# Patient Record
Sex: Female | Born: 1950 | Race: White | Hispanic: No | Marital: Married | Smoking: Never smoker
Health system: Southern US, Community
[De-identification: ages and names within clinical notes are randomized; demographics above are authoritative.]

## PROBLEM LIST (undated history)

## (undated) DIAGNOSIS — M1990 Unspecified osteoarthritis, unspecified site: Secondary | ICD-10-CM

## (undated) DIAGNOSIS — I350 Nonrheumatic aortic (valve) stenosis: Secondary | ICD-10-CM

## (undated) DIAGNOSIS — E7800 Pure hypercholesterolemia, unspecified: Secondary | ICD-10-CM

## (undated) DIAGNOSIS — T7840XA Allergy, unspecified, initial encounter: Secondary | ICD-10-CM

## (undated) HISTORY — DX: Allergy, unspecified, initial encounter: T78.40XA

## (undated) HISTORY — PX: INGUINAL HERNIA REPAIR: SUR1180

## (undated) HISTORY — PX: JOINT REPLACEMENT: SHX530

## (undated) HISTORY — PX: CARDIAC CATHETERIZATION: SHX172

---

## 1989-01-18 HISTORY — PX: GANGLION CYST EXCISION: SHX1691

## 1999-07-31 ENCOUNTER — Encounter: Payer: Self-pay | Admitting: Obstetrics and Gynecology

## 1999-07-31 ENCOUNTER — Ambulatory Visit (HOSPITAL_COMMUNITY): Admission: RE | Admit: 1999-07-31 | Discharge: 1999-07-31 | Payer: Self-pay | Admitting: Obstetrics and Gynecology

## 2000-11-10 ENCOUNTER — Other Ambulatory Visit: Admission: RE | Admit: 2000-11-10 | Discharge: 2000-11-10 | Payer: Self-pay | Admitting: *Deleted

## 2000-11-18 ENCOUNTER — Ambulatory Visit (HOSPITAL_COMMUNITY): Admission: RE | Admit: 2000-11-18 | Discharge: 2000-11-18 | Payer: Self-pay | Admitting: Obstetrics and Gynecology

## 2000-11-18 ENCOUNTER — Encounter: Payer: Self-pay | Admitting: Obstetrics and Gynecology

## 2002-01-06 ENCOUNTER — Other Ambulatory Visit: Admission: RE | Admit: 2002-01-06 | Discharge: 2002-01-06 | Payer: Self-pay | Admitting: Obstetrics and Gynecology

## 2006-07-21 ENCOUNTER — Emergency Department (HOSPITAL_COMMUNITY): Admission: EM | Admit: 2006-07-21 | Discharge: 2006-07-21 | Payer: Self-pay | Admitting: Emergency Medicine

## 2006-08-06 ENCOUNTER — Encounter: Admission: RE | Admit: 2006-08-06 | Discharge: 2006-08-22 | Payer: Self-pay | Admitting: Internal Medicine

## 2006-09-04 ENCOUNTER — Ambulatory Visit (HOSPITAL_COMMUNITY): Admission: RE | Admit: 2006-09-04 | Discharge: 2006-09-04 | Payer: Self-pay | Admitting: Internal Medicine

## 2006-09-04 ENCOUNTER — Ambulatory Visit: Payer: Self-pay | Admitting: Vascular Surgery

## 2006-09-04 ENCOUNTER — Encounter: Payer: Self-pay | Admitting: Vascular Surgery

## 2007-08-18 ENCOUNTER — Ambulatory Visit (HOSPITAL_COMMUNITY): Admission: RE | Admit: 2007-08-18 | Discharge: 2007-08-18 | Payer: Self-pay | Admitting: Emergency Medicine

## 2007-10-18 ENCOUNTER — Emergency Department (HOSPITAL_COMMUNITY): Admission: EM | Admit: 2007-10-18 | Discharge: 2007-10-18 | Payer: Self-pay | Admitting: Family Medicine

## 2008-02-02 ENCOUNTER — Ambulatory Visit: Payer: Self-pay | Admitting: Family Medicine

## 2008-06-05 ENCOUNTER — Emergency Department (HOSPITAL_COMMUNITY): Admission: EM | Admit: 2008-06-05 | Discharge: 2008-06-05 | Payer: Self-pay | Admitting: Family Medicine

## 2008-08-05 ENCOUNTER — Ambulatory Visit (HOSPITAL_COMMUNITY): Admission: RE | Admit: 2008-08-05 | Discharge: 2008-08-05 | Payer: Self-pay | Admitting: Otolaryngology

## 2008-09-29 ENCOUNTER — Emergency Department (HOSPITAL_COMMUNITY): Admission: EM | Admit: 2008-09-29 | Discharge: 2008-09-29 | Payer: Self-pay | Admitting: Family Medicine

## 2008-12-09 ENCOUNTER — Encounter: Payer: Self-pay | Admitting: Sports Medicine

## 2008-12-09 ENCOUNTER — Ambulatory Visit: Payer: Self-pay | Admitting: Family Medicine

## 2008-12-09 DIAGNOSIS — M76899 Other specified enthesopathies of unspecified lower limb, excluding foot: Secondary | ICD-10-CM

## 2009-01-27 ENCOUNTER — Ambulatory Visit: Payer: Self-pay | Admitting: Family Medicine

## 2009-01-27 DIAGNOSIS — M25569 Pain in unspecified knee: Secondary | ICD-10-CM

## 2009-01-27 DIAGNOSIS — Q6650 Congenital pes planus, unspecified foot: Secondary | ICD-10-CM

## 2009-02-10 ENCOUNTER — Ambulatory Visit: Payer: Self-pay | Admitting: Family Medicine

## 2009-08-16 IMAGING — CT CT MAXILLOFACIAL W/O CM
1 of 2 series · 16 of 30 positions shown, 20 images · non-contrast
Comparison: None

CLINICAL DATA: Recurrent sinusitis

CT MAXILLOFACIAL WITHOUT CONTRAST
TECHNIQUE: Multidetector CT imaging of the maxillofacial
structures was performed. Multiplanar CT image reconstructions were
also generated.

[Series 4: sinus prone · axial · 0.33mm/px · z∈[+117,+217]mm · 16 of 44 slices shown, 20 images]
[im 3/44  brain]
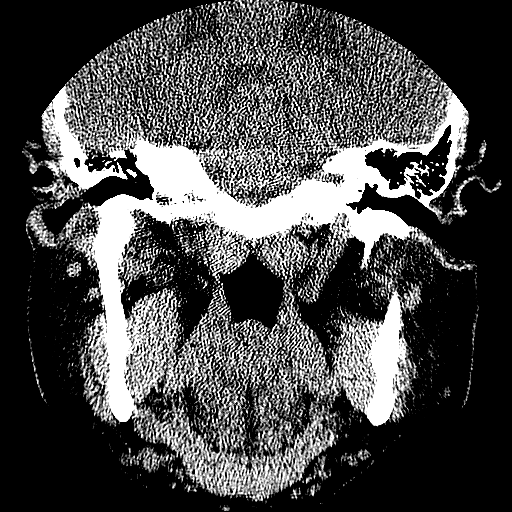
[im 3/44  bone]
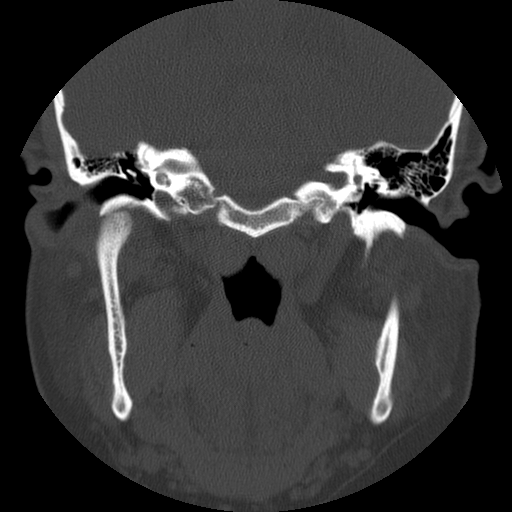
[im 5/44  bone]
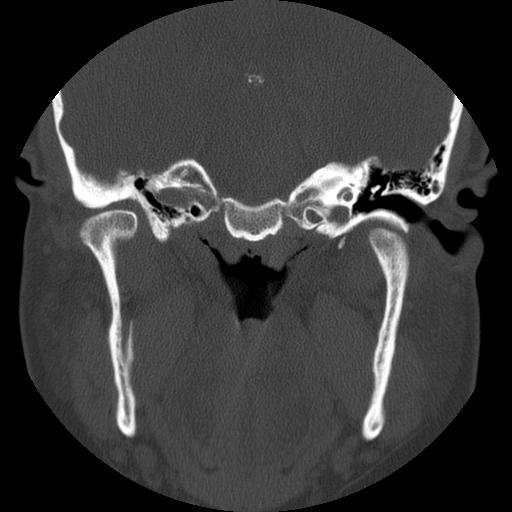
[im 8/44  bone]
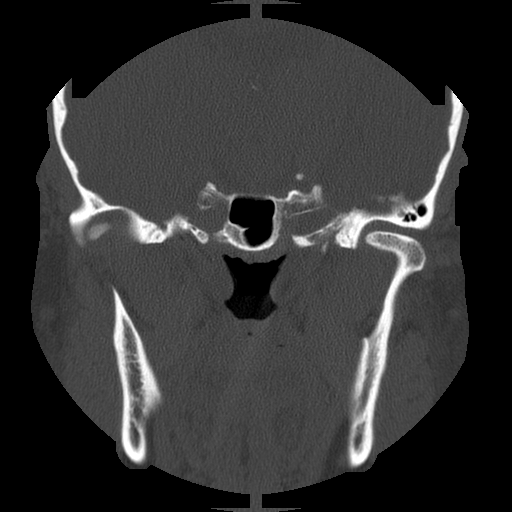
[im 10/44  bone]
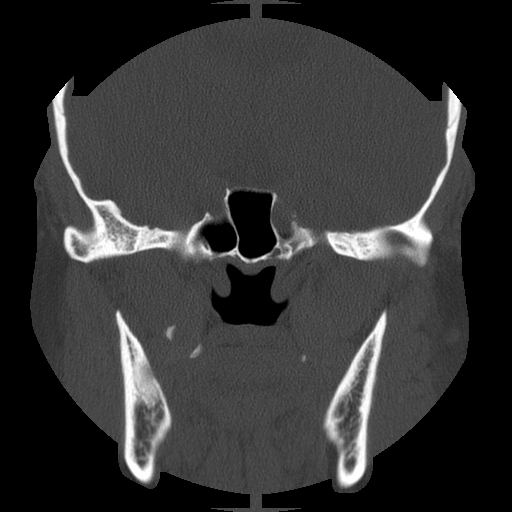
[im 12/44  brain]
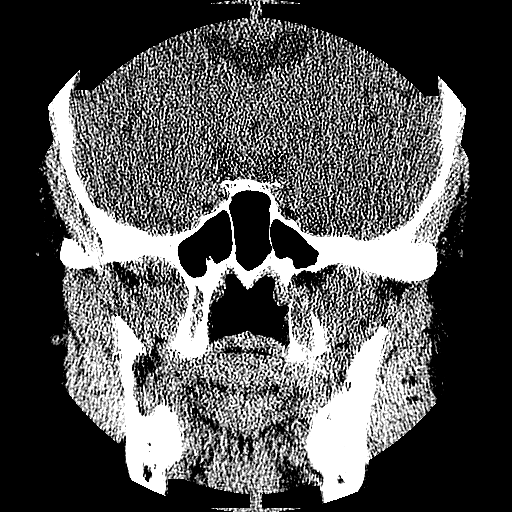
[im 12/44  bone]
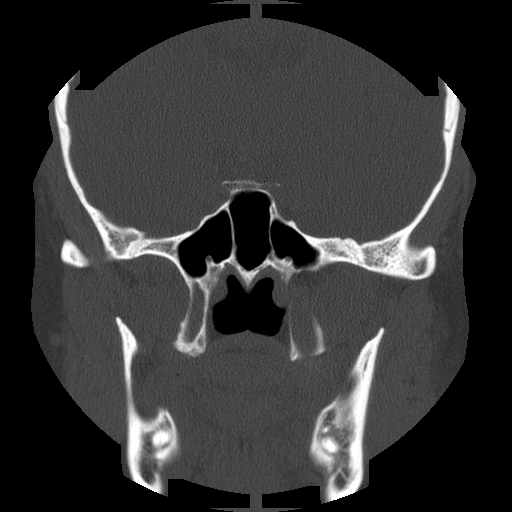
[im 15/44  bone]
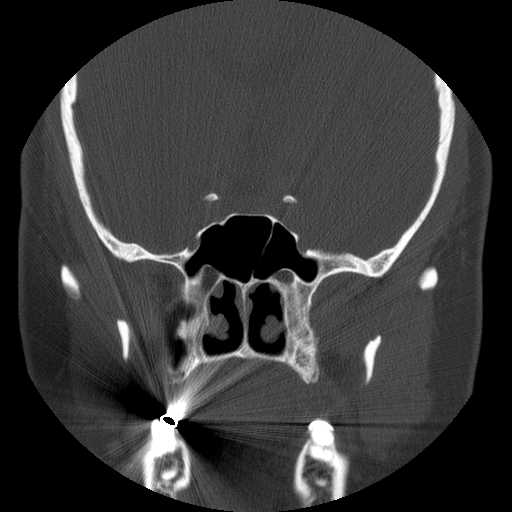
[im 17/44  bone]
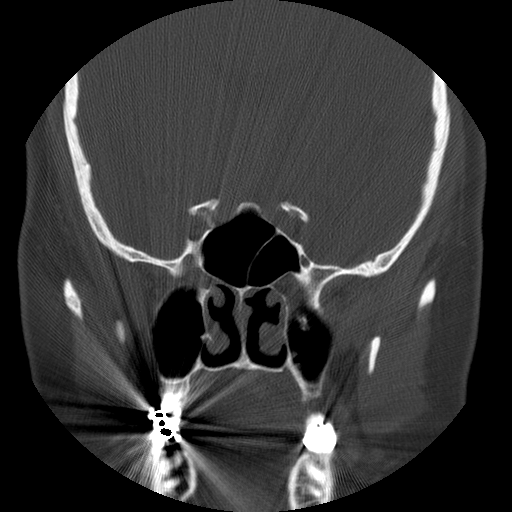
[im 20/44  bone]
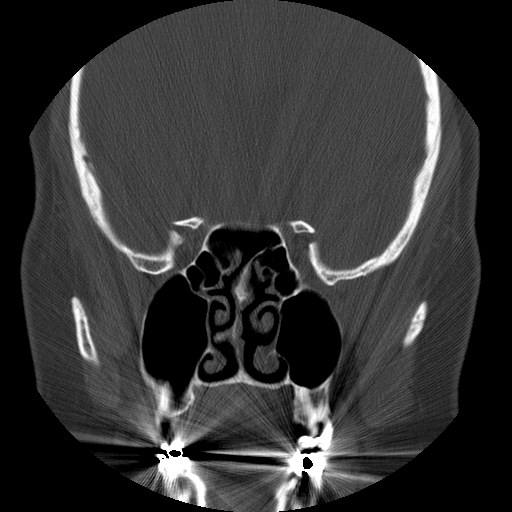
[im 24/44  brain]
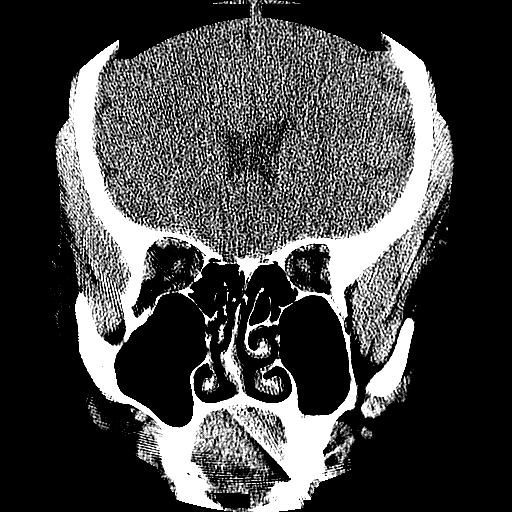
[im 24/44  bone]
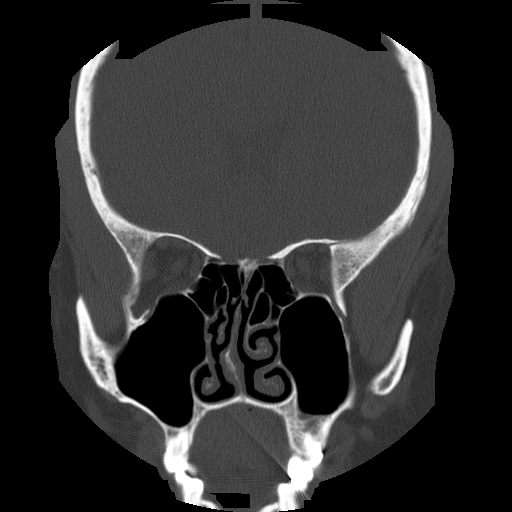
[im 27/44  bone]
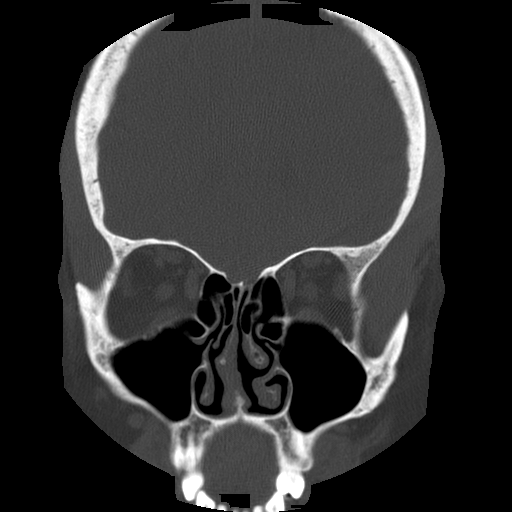
[im 29/44  bone]
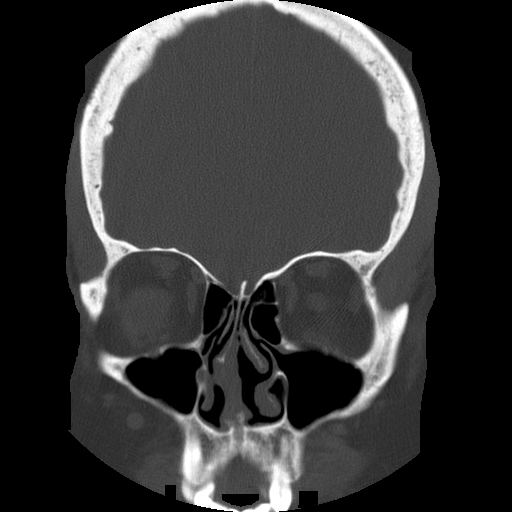
[im 32/44  bone]
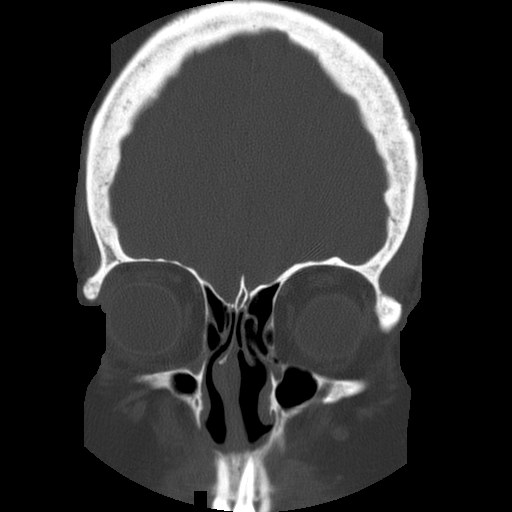
[im 34/44  brain]
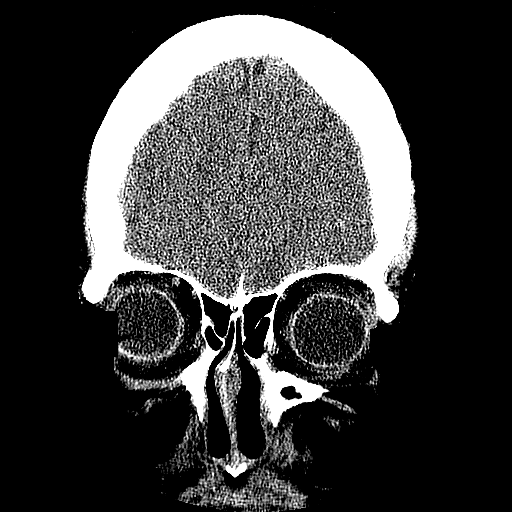
[im 34/44  bone]
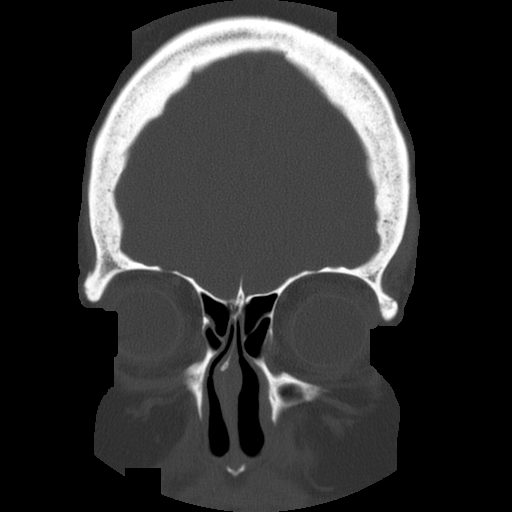
[im 36/44  bone]
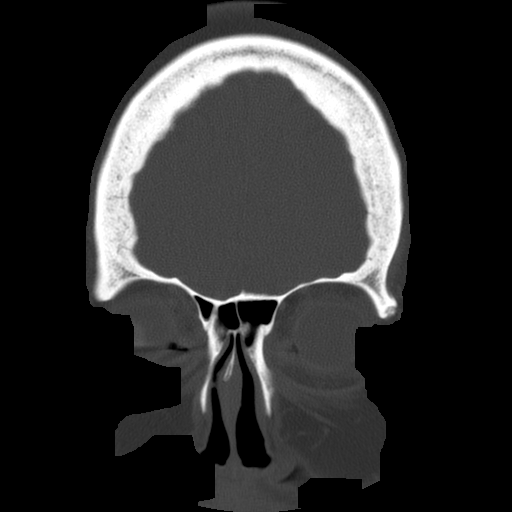
[im 39/44  bone]
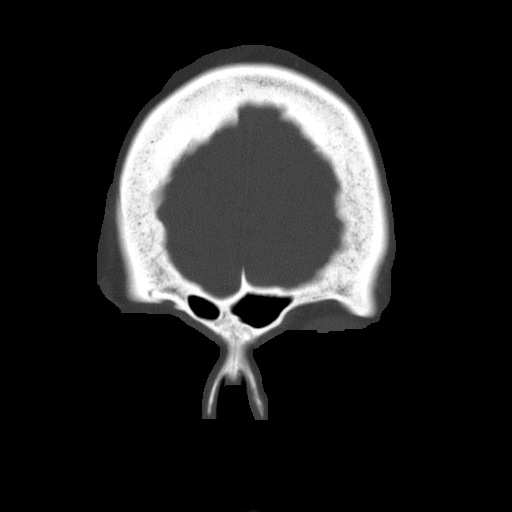
[im 41/44  bone]
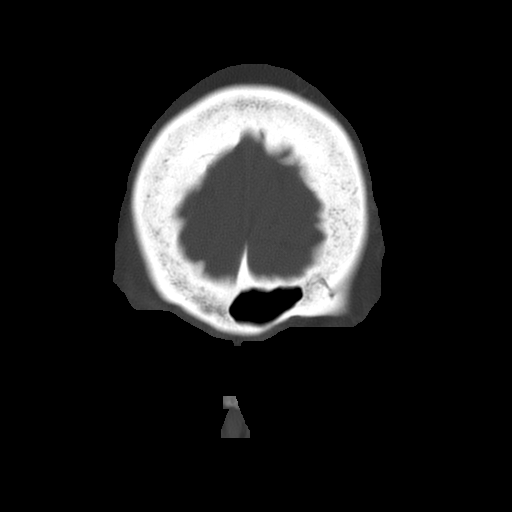

[16 of 30 positions shown; findings below may reference images not displayed]

FINDINGS: Left frontal sinus is hypoplastic.  The remainder of the
paranasal sinuses appear normally developed and well aerated.
There is mild leftward deviation of the nasal septum.  Ostiomeatal
units appear patent bilaterally.  Temporomandibular joints seated
bilaterally.  Multiple dental restorations noted.  Regional soft
tissues unremarkable.
IMPRESSION: 1.  Mild leftward nasal septal deviation.
2.  Otherwise negative study.

## 2010-05-11 ENCOUNTER — Emergency Department (HOSPITAL_COMMUNITY)
Admission: EM | Admit: 2010-05-11 | Discharge: 2010-05-11 | Payer: Self-pay | Source: Home / Self Care | Admitting: Emergency Medicine

## 2010-10-31 ENCOUNTER — Encounter: Payer: Self-pay | Admitting: Family Medicine

## 2010-10-31 ENCOUNTER — Ambulatory Visit (INDEPENDENT_AMBULATORY_CARE_PROVIDER_SITE_OTHER): Payer: 59 | Admitting: Family Medicine

## 2010-10-31 VITALS — BP 149/79 | HR 79 | Ht 64.0 in | Wt 280.0 lb

## 2010-10-31 DIAGNOSIS — M65331 Trigger finger, right middle finger: Secondary | ICD-10-CM

## 2010-10-31 DIAGNOSIS — M6530 Trigger finger, unspecified finger: Secondary | ICD-10-CM

## 2010-10-31 NOTE — Progress Notes (Signed)
  Subjective:    Patient ID: Charlene Tapia, female    DOB: Dec 25, 1950, 60 y.o.   MRN: [id number]  HPI 60 yo F here for 3 months of Rt hand pain and some triggering at base of her 3rd finger. Denies any specific injury. Denies history of diabetes. She has a constant toothache over the volar aspect of her third MCP. She's been doing over-the-counter anti-inflammatories. It is not trigger on her all the time, but will trigger on her occasionally. Is getting in the way of her job as she constantly is on the computer and using her hands. She works in the Production designer, theatre/television/film at [employer]. Has been some stretches at home.   Review of Systems Denies fever, sweats, chills, weight loss    Objective:   Physical Exam General appearance: Overweight female in no distress Psych: Pleasant affect Neuro: Alert and oriented Neurovascularly: Intact distally Right hand positive tenderness over the volar aspect of the right third MCP joint at the A1 pulley. Palpation with dynamic flexion extension does reveal a nodular structure. She is able to actively trigger one time for me during exam.       Assessment & Plan:  Right third finger - Discussed options, she would like to proceed with injection today, see procedure note below -Continue over-the-counter anti-inflammatories and some home stretches and self massage -Followup in one month. I described the nature of this treatment process, and said we could inject up to a couple more times. She may also do well with hand therapy. I discussed that at some point if it does not get better she can see an orthopedic surgeon for surgical release.  Consent obtained and verified. Sterile betadine prep. Furthur cleansed with alcohol. Topical analgesic spray: Ethyl chloride. Joint: Rt 3rd trigger finger at A1 pully Approached in typical fashion with: direct approach into nodule Completed without difficulty Meds: 0.4 cc kenalog 10 mg and 0.4 cc 1% lidocaine Needle:  25G 1.5 inch Aftercare instructions and Red flags advised.

## 2011-04-10 ENCOUNTER — Encounter (HOSPITAL_COMMUNITY): Payer: Self-pay | Admitting: *Deleted

## 2011-04-10 ENCOUNTER — Emergency Department (HOSPITAL_COMMUNITY)
Admission: EM | Admit: 2011-04-10 | Discharge: 2011-04-10 | Disposition: A | Discharge status: Home / Self Care | Payer: 59 | Source: Home / Self Care | Attending: Emergency Medicine | Admitting: Emergency Medicine

## 2011-04-10 DIAGNOSIS — R05 Cough: Secondary | ICD-10-CM

## 2011-04-10 DIAGNOSIS — J329 Chronic sinusitis, unspecified: Secondary | ICD-10-CM

## 2011-04-10 MED ORDER — FEXOFENADINE HCL 60 MG PO TABS: 60.0000 mg | ORAL_TABLET | Freq: Two times a day (BID) | ORAL | Status: DC

## 2011-04-10 MED ORDER — AMOXICILLIN-POT CLAVULANATE 500-125 MG PO TABS: 1.0000 | ORAL_TABLET | Freq: Two times a day (BID) | ORAL | Status: AC

## 2011-04-10 NOTE — ED Notes (Signed)
Pt  Has  Symptoms  Of   Sinus    Congestion  With  Bright  Yellow  Congestion     Nasal  stuffyness      Symptoms     X  3  Weeks   Not  releived   By    otc  meds  Also has    Headache

## 2011-04-10 NOTE — ED Provider Notes (Signed)
History     CSN: [id number] Arrival date & time: 04/10/2011  3:04 PM   First MD Initiated Contact with Patient 04/10/11 1457      Chief Complaint  Patient presents with  . Nasal Congestion    (Consider location/radiation/quality/duration/timing/severity/associated sxs/prior treatment) HPI Comments: Sinus congestion and pressure on my sinuses (points to both maxillary and frontal regions) with yellow discharge and dripping my coughing is been worse  Patient is a 60 y.o. female presenting with sinusitis. The history is provided by the patient.  Sinusitis  This is a new problem. There has been no fever. The pain is at a severity of 4/10. The pain is moderate. The pain has been constant since onset. Associated symptoms include congestion, sinus pressure and sore throat. Pertinent negatives include no cough and no shortness of breath.    No past medical history on file.  No past surgical history on file.  No family history on file.  History  Substance Use Topics  . Smoking status: Never Smoker   . Smokeless tobacco: Never Used  . Alcohol Use: Not on file    OB History    Grav Para Term Preterm Abortions TAB SAB Ect Mult Living                  Review of Systems  Constitutional: Negative for fever and fatigue.  HENT: Positive for congestion, sore throat, postnasal drip and sinus pressure. Negative for neck stiffness and ear discharge.   Eyes: Negative for pain.  Respiratory: Negative for cough, shortness of breath and wheezing.     Allergies  Phisohex  Home Medications   Current Outpatient Rx  Name Route Sig Dispense Refill  . MOMETASONE FUROATE 50 MCG/ACT NA SUSP Nasal Place 2 sprays into the nose daily.        There were no vitals taken for this visit.  Physical Exam  Nursing note and vitals reviewed. Constitutional: She appears well-developed and well-nourished.  HENT:  Head: Normocephalic.  Right Ear: Tympanic membrane normal.  Left Ear: Tympanic  membrane normal.  Mouth/Throat: Uvula is midline and mucous membranes are normal. Oropharyngeal exudate present.  Eyes: Pupils are equal, round, and reactive to light. Right eye exhibits no discharge. Left eye exhibits no discharge.  Neck: Normal range of motion.  Pulmonary/Chest: Effort normal and breath sounds normal. No respiratory distress. She has no wheezes. She exhibits no tenderness.  Lymphadenopathy:    She has no cervical adenopathy.    ED Course  Procedures (including critical care time)  Labs Reviewed - No data to display No results found.   No diagnosis found.    MDM  Sinus sx's x 3 weeks worsening and with a reactive cough        Jimmie Molly, MD 04/10/11 1605

## 2011-04-30 ENCOUNTER — Ambulatory Visit (INDEPENDENT_AMBULATORY_CARE_PROVIDER_SITE_OTHER): Payer: 59

## 2011-04-30 DIAGNOSIS — J40 Bronchitis, not specified as acute or chronic: Secondary | ICD-10-CM

## 2011-04-30 DIAGNOSIS — J019 Acute sinusitis, unspecified: Secondary | ICD-10-CM

## 2011-04-30 DIAGNOSIS — R51 Headache: Secondary | ICD-10-CM

## 2011-05-07 ENCOUNTER — Other Ambulatory Visit: Payer: Self-pay | Admitting: Oncology

## 2011-05-07 DIAGNOSIS — C50919 Malignant neoplasm of unspecified site of unspecified female breast: Secondary | ICD-10-CM

## 2011-05-07 DIAGNOSIS — E559 Vitamin D deficiency, unspecified: Secondary | ICD-10-CM

## 2011-09-02 ENCOUNTER — Other Ambulatory Visit: Payer: Self-pay | Admitting: Internal Medicine

## 2012-11-10 ENCOUNTER — Ambulatory Visit (INDEPENDENT_AMBULATORY_CARE_PROVIDER_SITE_OTHER): Payer: 59 | Admitting: Family Medicine

## 2012-11-10 ENCOUNTER — Encounter: Payer: Self-pay | Admitting: Family Medicine

## 2012-11-10 VITALS — BP 167/95 | Ht 64.0 in | Wt 280.0 lb

## 2012-11-10 DIAGNOSIS — M25569 Pain in unspecified knee: Secondary | ICD-10-CM

## 2012-11-10 DIAGNOSIS — M25562 Pain in left knee: Secondary | ICD-10-CM | POA: Insufficient documentation

## 2012-11-10 MED ORDER — TRAMADOL HCL 50 MG PO TABS: 50.0000 mg | ORAL_TABLET | Freq: Every evening | ORAL | Status: DC | PRN

## 2012-11-10 MED ORDER — MELOXICAM 15 MG PO TABS: 15.0000 mg | ORAL_TABLET | Freq: Every day | ORAL | Status: DC

## 2012-11-10 NOTE — Progress Notes (Signed)
Chief complaint left knee pain  History of present illness: Patient is a 62 year old female with a past medical history significant for left knee osteoarthritis coming in with worsening knee pain. Patient back in 2008 did have x-rays which were reviewed today. Patient had osteoarthritis of the lateral compartment with joint space narrowing and spur formation and when reviewed appears to be more of a tricompartmental osteoarthritic changes. Patient states that this has progressively worsened over the course of time. Patient states that the pain is now more on the medial aspect of the left knee. Patient states that there is more swelling of this knee during the course of the day. Patient is nonambulatory with the aid of a cane. Patient describes the pain is more of a severe sharp aching sensation with movement that as a dull aching sensation with sitting. Patient denies any radiation down the leg or any numbness or tingling. She does state that from time to time it feels like it is ready to get out of her. Patient denies any falls appear.  Past medical history, social, surgical and family history all reviewed.   Physical exam Blood pressure 167/95, height 5\' 4"  (1.626 m), weight 280 lb (127.007 kg). General: No apparent distress alert and oriented x3 mood and affect normal patient is obese. Respiratory: Patient's speak in full sentences and does not appear short of breath Skin: Warm dry intact with no signs of infection or rash Neuro: Cranial nerves II through XII are intact, neurovascularly intact in all extremities with 2+ DTRs and 2+ pulses. Left knee exam: On inspection patient does have trace effusion. She is severely tender to the patient over the medial and lateral joint lines. Patient has range of motion from 10 to 95. Patient does have an severe crepitus on range of motion. The ligaments appear to be intact. Positive McMurray's. Neurovascular intact distally

## 2012-11-10 NOTE — Assessment & Plan Note (Signed)
Patient had history of osteoarthritic changes and is likely had progression. Patient declined repeat imaging at this time. I do think would patient's mechanical symptoms and positive McMurray's today she may also have a degenerative meniscal tear given her some discomfort.  After verbal and written consent patient was prepped with 2 alcohol swabs and then did have injection of with a 25-gauge 1-1/2 inch needle 2 cc of 1% lidocaine, 2 cc of 0.5% Marcaine, and 1 cc of that the Medrol 80 mg/dL injected into the right knee. Patient tolerated the procedure well with no blood loss. Patient did have improvement in pain immediately.  Patient given a compression sleeve, meloxicam and tramadol, discussed Tylenol, given home exercise program at this time. Patient declined a physical therapy. Patient though may call at any point and we will order physical therapy. Patient return in 4-6 weeks for further evaluation. At that time if she continues to have pain I would like to start formal physical therapy and get x-rays.

## 2012-11-10 NOTE — Patient Instructions (Addendum)
Very good to see you again. Try meloxicam daily for one week and as needed thereafter. Tylenol 650 mg 3 times a day scheduled Tramadol one pill at night if needed. Exercises that giving you. You can call back and we can start physical therapy at any point. Icing 20 minutes 2-3 times a day to be helpful as well. Tried a compression sleeve. Come back again in 4-6 weeks

## 2013-05-04 ENCOUNTER — Ambulatory Visit (INDEPENDENT_AMBULATORY_CARE_PROVIDER_SITE_OTHER): Payer: 59 | Admitting: Sports Medicine

## 2013-05-04 ENCOUNTER — Encounter: Payer: Self-pay | Admitting: Sports Medicine

## 2013-05-04 VITALS — BP 164/91 | HR 73 | Ht 64.0 in | Wt 280.0 lb

## 2013-05-04 DIAGNOSIS — M1710 Unilateral primary osteoarthritis, unspecified knee: Secondary | ICD-10-CM

## 2013-05-04 DIAGNOSIS — M25562 Pain in left knee: Secondary | ICD-10-CM

## 2013-05-04 DIAGNOSIS — E669 Obesity, unspecified: Secondary | ICD-10-CM

## 2013-05-04 DIAGNOSIS — M1712 Unilateral primary osteoarthritis, left knee: Secondary | ICD-10-CM

## 2013-05-04 DIAGNOSIS — M25569 Pain in unspecified knee: Secondary | ICD-10-CM

## 2013-05-04 MED ORDER — MELOXICAM 15 MG PO TABS: 15.0000 mg | ORAL_TABLET | Freq: Every day | ORAL | Status: DC

## 2013-05-04 MED ORDER — TRIAMCINOLONE ACETONIDE 10 MG/ML IJ SUSP
10.0000 mg | Freq: Once | INTRAMUSCULAR | Status: AC
  Administered 2013-05-04: 10 mg via INTRA_ARTICULAR

## 2013-05-04 NOTE — Progress Notes (Signed)
Family Medicine Office Visit Note   Subjective:   Patient ID: Charlene Tapia, female  DOB: [birth year], 62 y.o.. MRN: [id number]   Pt with PMHX significant for Bilateral Osteoarthritis of Knee that comes today complaining of worsening left knee pain. She has had one steroid knee injection in that L knee in June 2013 with good response and is requesting another steroid shot in order to make through the holidays. Since she is considering Knee replacement surgery.  Note that review of her standing x-rays from 2008 and they showed that even 6 years ago she had significant osteoarthritis  Objective:   Physical Exam: Gen:  NAD. Walking aid by cane. MSK: Full ROM, small crepitus with knee extension. Tenderness to palpation on medial aspect at level of pes anserine bursa. No Knee laxity.  Joint line was nontender Flexion and extension was not terribly painful There are chronic degenerative changes but the knee is not warm or swollen  There is puffiness and direct tenderness over the pes anserine  Assessment & Plan:    Pes Anserine injection Procedure Note  Pre-operative Diagnosis: left Pes anerine Bursitis  Post-operative Diagnosis: same  Indications: Symptom relief  Procedure Details  Verbal consent was obtained for the procedure. The joint was prepped with Isopropyl alcohol. A 25 gauge needle was inserted into the medial aspect of the patellar bursa. 2 ml 1% lidocaine and 2 ml of depomedrol -10mg  was then injected into the area; needle was removed and the area cleansed and dressed.(Correction - med was Kenalog 10 KBF)  Complications:  None; patient tolerated the procedure well.

## 2013-05-04 NOTE — Patient Instructions (Addendum)
Knee Injection RISKS AND COMPLICATIONS Side effects from cortisone shots are rare. They include:   Slight bruising of the skin.  Shrinkage of the normal fatty tissue under the skin where the shot was given.  Increase in pain after the shot.  Infection.  Weakening of tendons or tendon rupture.  Allergic reaction to the medicine.  Diabetics may have a temporary increase in their blood sugar after a shot.  Cortisone can temporarily weaken the immune system. While receiving these shots, you should not get certain vaccines. Also, avoid contact with anyone who has chickenpox or measles. Especially if you have never had these diseases or have not been previously immunized. Your immune system may not be strong enough to fight off the infection while the cortisone is in your system. AFTER THE PROCEDURE   You can go home after the procedure.  You may need to put ice on the joint 15-20 minutes every 3 or 4 hours until the pain goes away.  You may need to put an elastic bandage on the joint. HOME CARE INSTRUCTIONS   Only take over-the-counter or prescription medicines for pain, discomfort, or fever as directed by your caregiver.  You should avoid stressing the joint. Unless advised otherwise, avoid activities that put a lot of pressure on a knee joint, such as:  Jogging.  Bicycling.  Recreational climbing.  Hiking.  Laying down and elevating the leg/knee above the level of your heart can help to minimize swelling. SEEK MEDICAL CARE IF:   You have repeated or worsening swelling.  There is drainage from the puncture area.  You develop red streaking that extends above or below the site where the needle was inserted. SEEK IMMEDIATE MEDICAL CARE IF:   You develop a fever.  You have pain that gets worse even though you are taking pain medicine.  The area is red and warm, and you have trouble moving the joint. MAKE SURE YOU:   Understand these instructions.  Will watch your  condition.  Will get help right away if you are not doing well or get worse. Document Released: 07/28/2006 Document Revised: 07/29/2011 Document Reviewed: 04/24/2007 [facility] Patient Information 2014 [place], [place].  DR Alphonsa Gin ORTHO [address] [place] [facility] Tuesday 05/18/13 @ 815AM [phone]

## 2013-05-04 NOTE — Assessment & Plan Note (Addendum)
Pes anseriner bursa injection.  Pt tolerated procedure well.  Referral for Ortho evaluation given.   Precautions given about injection  If she is not getting enough relief she may return  Prescription for Mobic for pain

## 2013-05-21 ENCOUNTER — Ambulatory Visit: Payer: 59

## 2013-05-21 ENCOUNTER — Ambulatory Visit (INDEPENDENT_AMBULATORY_CARE_PROVIDER_SITE_OTHER): Payer: 59 | Admitting: Family Medicine

## 2013-05-21 VITALS — BP 162/70 | HR 98 | Temp 102.8°F | Resp 16 | Ht 64.0 in | Wt 278.0 lb

## 2013-05-21 DIAGNOSIS — R062 Wheezing: Secondary | ICD-10-CM

## 2013-05-21 DIAGNOSIS — R059 Cough, unspecified: Secondary | ICD-10-CM

## 2013-05-21 DIAGNOSIS — R509 Fever, unspecified: Secondary | ICD-10-CM

## 2013-05-21 DIAGNOSIS — R05 Cough: Secondary | ICD-10-CM

## 2013-05-21 LAB — POCT CBC
Granulocyte percent: 79.9 %G (ref 37–80)
HCT, POC: 44.2 % (ref 37.7–47.9)
Hemoglobin: 13.7 g/dL (ref 12.2–16.2)
Lymph, poc: 1.1 (ref 0.6–3.4)
MCH, POC: 28.9 pg (ref 27–31.2)
MCHC: 31 g/dL — AB (ref 31.8–35.4)
MCV: 93.2 fL (ref 80–97)
MID (cbc): 0.7 (ref 0–0.9)
MPV: 9.3 fL (ref 0–99.8)
POC Granulocyte: 7.1 — AB (ref 2–6.9)
POC LYMPH PERCENT: 12.1 %L (ref 10–50)
POC MID %: 8 %M (ref 0–12)
Platelet Count, POC: 168 10*3/uL (ref 142–424)
RBC: 4.74 M/uL (ref 4.04–5.48)
RDW, POC: 12.8 %
WBC: 8.9 10*3/uL (ref 4.6–10.2)

## 2013-05-21 LAB — POCT INFLUENZA A/B
Influenza A, POC: NEGATIVE
Influenza B, POC: NEGATIVE

## 2013-05-21 MED ORDER — IPRATROPIUM BROMIDE 0.02 % IN SOLN
0.5000 mg | Freq: Once | RESPIRATORY_TRACT | Status: AC
  Administered 2013-05-21: 0.5 mg via RESPIRATORY_TRACT

## 2013-05-21 MED ORDER — ALBUTEROL SULFATE (2.5 MG/3ML) 0.083% IN NEBU
2.5000 mg | INHALATION_SOLUTION | Freq: Once | RESPIRATORY_TRACT | Status: AC
  Administered 2013-05-21: 2.5 mg via RESPIRATORY_TRACT

## 2013-05-21 MED ORDER — HYDROCOD POLST-CHLORPHEN POLST 10-8 MG/5ML PO LQCR: 5.0000 mL | Freq: Two times a day (BID) | ORAL | Status: DC | PRN

## 2013-05-21 MED ORDER — AZITHROMYCIN 250 MG PO TABS: ORAL_TABLET | ORAL | Status: DC

## 2013-05-21 MED ORDER — OSELTAMIVIR PHOSPHATE 75 MG PO CAPS: 75.0000 mg | ORAL_CAPSULE | Freq: Two times a day (BID) | ORAL | Status: DC

## 2013-05-21 NOTE — Progress Notes (Signed)
Subjective:    Patient ID: Charlene Tapia, female    DOB: Apr 07, 1951, 63 y.o.   MRN: [id number]  HPI 63 year old female presents for evaluation of 3 day history of cough, nasal congestion, PND, fever, chills, and body aches. Symptoms started on 05/18/13 with slight cough and nasal congestion and have progressively worsened. Developed fever on 12/31 and has continued to run high fever >100.0.  She has been taking tylenol and Robitussin.  Denies SOB, chest pain, sore throat, dizziness, nausea, vomiting, or abdominal pain.  Admits she does have sinus pain/pressure, nasal congestion, and fatigue.  No known flu exposure. Did have flu shot this year.  Patient is otherwise doing well with no other concerns today.     Review of Systems  Constitutional: Positive for fever, chills and fatigue.  HENT: Positive for congestion, postnasal drip, rhinorrhea and sinus pressure. Negative for ear pain and sore throat.   Respiratory: Positive for cough and wheezing. Negative for chest tightness and shortness of breath.   Cardiovascular: Negative for chest pain.  Gastrointestinal: Negative for nausea, vomiting and abdominal pain.  Neurological: Negative for dizziness and headaches.       Objective:   Physical Exam  Constitutional: She is oriented to person, place, and time. She appears well-developed and well-nourished.  HENT:  Head: Normocephalic and atraumatic.  Right Ear: Hearing, tympanic membrane, external ear and ear canal normal.  Left Ear: Hearing, tympanic membrane, external ear and ear canal normal.  Mouth/Throat: Uvula is midline, oropharynx is clear and moist and mucous membranes are normal. No oropharyngeal exudate.  Eyes: Conjunctivae are normal.  Neck: Normal range of motion. Neck supple.  Cardiovascular: Normal rate, regular rhythm and normal heart sounds.   Pulmonary/Chest: Effort normal. She has wheezes.  Neurological: She is alert and oriented to person, place, and time.    Psychiatric: She has a normal mood and affect. Her behavior is normal. Judgment and thought content normal.    Results for orders placed in visit on 05/21/13  POCT CBC      Result Value Range   WBC 8.9  4.6 - 10.2 K/uL   Lymph, poc 1.1  0.6 - 3.4   POC LYMPH PERCENT 12.1  10 - 50 %L   MID (cbc) 0.7  0 - 0.9   POC MID % 8.0  0 - 12 %M   POC Granulocyte 7.1 (*) 2 - 6.9   Granulocyte percent 79.9  37 - 80 %G   RBC 4.74  4.04 - 5.48 M/uL   Hemoglobin 13.7  12.2 - 16.2 g/dL   HCT, POC 44.2  37.7 - 47.9 %   MCV 93.2  80 - 97 fL   MCH, POC 28.9  27 - 31.2 pg   MCHC 31.0 (*) 31.8 - 35.4 g/dL   RDW, POC 12.8     Platelet Count, POC 168  142 - 424 K/uL   MPV 9.3  0 - 99.8 fL  POCT INFLUENZA A/B      Result Value Range   Influenza A, POC Negative     Influenza B, POC Negative       [facility] reading (PRIMARY) by  Dr. Carlota Raspberry as increased markings in RLL. No acute infiltrate or consolidation noted..      Assessment & Plan:  Cough - Plan: POCT CBC, DG Chest 2 View, azithromycin (ZITHROMAX) 250 MG tablet, chlorpheniramine-HYDROcodone (TUSSIONEX PENNKINETIC ER) 10-8 MG/5ML LQCR  Fever, unspecified - Plan: POCT CBC, POCT Influenza A/B  Wheezing -  Plan: albuterol (PROVENTIL) (2.5 MG/3ML) 0.083% nebulizer solution 2.5 mg, ipratropium (ATROVENT) nebulizer solution 0.5 mg  Despite negative flu test, likely influenza virus causing symptoms CBC and CXR normal, but will cover with Zpack due to symptoms Tussionex qhs prn cough. Start Tamiflu 75 mg bid x 5 days (Rx for Tamiflu given to her husband as well who will start treatment if he develops symptoms).  Follow up if symptoms worsen or fail to improve. Ok to extend note to include 1/5 if needed

## 2013-05-21 NOTE — Progress Notes (Signed)
Xray read and patient discussed with Charlene Tapia. Agree with assessment and plan of care per her note.  XR report noted.  Trachea is midline. Heart size normal. Minimal linear opacification  of the left lung base. Probable prominent vascular markings at the  right lung base. Lungs are otherwise clear. No pleural fluid.  Degenerative changes are seen in the spine.  IMPRESSION:  Probable linear scarring or atelectasis at the left lung base.

## 2013-07-30 ENCOUNTER — Other Ambulatory Visit: Payer: Self-pay | Admitting: Orthopedic Surgery

## 2013-08-02 ENCOUNTER — Encounter (HOSPITAL_COMMUNITY): Payer: Self-pay | Admitting: Pharmacy Technician

## 2013-08-05 NOTE — Pre-Procedure Instructions (Addendum)
Charlene Tapia  08/05/2013   Your procedure is scheduled on: March 30th, Monday   Report to [facility] short stay admitting at  8:00 AM.  Call this number if you have problems the morning of surgery: [phone]   Remember:   Do not eat food or drink liquids after midnight Sunday.   Take these medicines the morning of surgery with A SIP OF WATER: Tramadol.  Flonase for nose.        STOP all herbel meds, nsaids (aleve,naproxen,advil,ibuprofen) 5 days prior to surgery including meloxicam,vitamins,aspirin   Do not wear jewelry, make-up or nail polish.  Do not wear lotions, powders, or perfumes. You may wear deodorant.   Do not shave 48 hours prior to surgery.   Do not bring valuables to the hospital.  [facility] is not responsible for any belongings or valuables.               Contacts, dentures or bridgework may not be worn into surgery.  Leave suitcase in the car. After surgery it may be brought to your room.  For patients admitted to the hospital, discharge time is determined by your treatment team.    Name and phone number of your contact person:     Special Instructions: [place] - Preparing for Surgery  Before surgery, you can play an important role.  Because skin is not sterile, your skin needs to be as free of germs as possible.  You can reduce the number of germs on you skin by washing with CHG (chlorahexidine gluconate) soap before surgery.  CHG is an antiseptic cleaner which kills germs and bonds with the skin to continue killing germs even after washing.  Please DO NOT use if you have an allergy to CHG or antibacterial soaps.  If your skin becomes reddened/irritated stop using the CHG and inform your nurse when you arrive at Short Stay.  Do not shave (including legs and underarms) for at least 48 hours prior to the first CHG shower.  You may shave your face.  Please follow these instructions carefully:   1.  Shower with CHG Soap the night before surgery and the  morning of Surgery.  2.  If you choose to wash your hair, wash your hair first as usual with your normal shampoo.  3.  After you shampoo, rinse your hair and body thoroughly to remove the Shampoo.  4.  Use CHG as you would any other liquid soap.  You can apply chg directly  to the skin and wash gently with scrungie or a clean washcloth.  5.  Apply the CHG Soap to your body ONLY FROM THE NECK DOWN.  Do not use on open wounds or open sores.  Avoid contact with your eyes ears, mouth and genitals (private parts).  Wash genitals (private parts)       with your normal soap.  6.  Wash thoroughly, paying special attention to the area where your surgery will be performed.  7.  Thoroughly rinse your body with warm water from the neck down.  8.  DO NOT shower/wash with your normal soap after using and rinsing off the CHG Soap.  9.  Pat yourself dry with a clean towel.            10.  Wear clean pajamas.            11 .  Place clean sheets on your bed the night of your first shower and do not sleep with pets.  Day of Surgery  Do not apply any lotion,perfume,powder  the morning of surgery.  Please wear clean clothes to the hospital/surgery center.                                                                                                                                                                                                                                                                                .                      Please read over the following fact sheets that you were given: Pain Booklet, Blood Transfusion Information, Total Joint Packet and MRSA Information

## 2013-08-06 ENCOUNTER — Encounter (HOSPITAL_COMMUNITY): Payer: Self-pay

## 2013-08-06 ENCOUNTER — Encounter (HOSPITAL_COMMUNITY)
Admission: RE | Admit: 2013-08-06 | Discharge: 2013-08-06 | Disposition: A | Discharge status: Home / Self Care | Payer: 59 | Source: Ambulatory Visit | Attending: Orthopedic Surgery | Admitting: Orthopedic Surgery

## 2013-08-06 DIAGNOSIS — Z01812 Encounter for preprocedural laboratory examination: Secondary | ICD-10-CM | POA: Insufficient documentation

## 2013-08-06 DIAGNOSIS — Z01810 Encounter for preprocedural cardiovascular examination: Secondary | ICD-10-CM | POA: Insufficient documentation

## 2013-08-06 LAB — CBC WITH DIFFERENTIAL/PLATELET
Basophils Absolute: 0.1 10*3/uL (ref 0.0–0.1)
Basophils Relative: 1 % (ref 0–1)
EOS PCT: 5 % (ref 0–5)
Eosinophils Absolute: 0.3 10*3/uL (ref 0.0–0.7)
HEMATOCRIT: 40.9 % (ref 36.0–46.0)
Hemoglobin: 13.7 g/dL (ref 12.0–15.0)
LYMPHS ABS: 1.8 10*3/uL (ref 0.7–4.0)
LYMPHS PCT: 27 % (ref 12–46)
MCH: 29.7 pg (ref 26.0–34.0)
MCHC: 33.5 g/dL (ref 30.0–36.0)
MCV: 88.7 fL (ref 78.0–100.0)
Monocytes Absolute: 0.5 10*3/uL (ref 0.1–1.0)
Monocytes Relative: 8 % (ref 3–12)
Neutro Abs: 4 10*3/uL (ref 1.7–7.7)
Neutrophils Relative %: 60 % (ref 43–77)
Platelets: 212 10*3/uL (ref 150–400)
RBC: 4.61 MIL/uL (ref 3.87–5.11)
RDW: 13 % (ref 11.5–15.5)
WBC: 6.7 10*3/uL (ref 4.0–10.5)

## 2013-08-06 LAB — BASIC METABOLIC PANEL
BUN: 14 mg/dL (ref 6–23)
CALCIUM: 9.7 mg/dL (ref 8.4–10.5)
CHLORIDE: 101 meq/L (ref 96–112)
CO2: 27 mEq/L (ref 19–32)
Creatinine, Ser: 0.59 mg/dL (ref 0.50–1.10)
GFR calc Af Amer: >90 mL/min (ref >90)
GFR calc non Af Amer: >90 mL/min (ref >90)
Glucose, Bld: 94 mg/dL (ref 70–99)
Potassium: 4.2 mEq/L (ref 3.7–5.3)
SODIUM: 140 meq/L (ref 137–147)

## 2013-08-06 LAB — URINALYSIS, ROUTINE W REFLEX MICROSCOPIC
BILIRUBIN URINE: NEGATIVE
Glucose, UA: NEGATIVE mg/dL
Hgb urine dipstick: NEGATIVE
KETONES UR: NEGATIVE mg/dL
Leukocytes, UA: NEGATIVE
NITRITE: NEGATIVE
PROTEIN: NEGATIVE mg/dL
Specific Gravity, Urine: 1.007 (ref 1.005–1.030)
UROBILINOGEN UA: 0.2 mg/dL (ref 0.0–1.0)
pH: 6.5 (ref 5.0–8.0)

## 2013-08-06 LAB — APTT: APTT: 29 s (ref 24–37)

## 2013-08-06 LAB — ABO/RH: ABO/RH(D): O POS

## 2013-08-06 LAB — TYPE AND SCREEN
ABO/RH(D): O POS
ANTIBODY SCREEN: NEGATIVE

## 2013-08-06 LAB — SURGICAL PCR SCREEN
MRSA, PCR: NEGATIVE
Staphylococcus aureus: NEGATIVE

## 2013-08-06 LAB — PROTIME-INR
INR: 0.94 (ref 0.00–1.49)
PROTHROMBIN TIME: 12.4 s (ref 11.6–15.2)

## 2013-08-06 NOTE — Progress Notes (Signed)
Patient inst to try chg on small area to be sure no reaction due to phisohex allergy. Pharmacist said should be okay with chg.

## 2013-08-09 NOTE — Progress Notes (Signed)
Anesthesia Chart Review:  Patient is a 63 year old female scheduled for left TKA on 08/16/13 by Dr. Mayer Camel.  History includes non-smoker, arthritis.  BMI is listed as 47.9 consistent with morbid obesity.    EKG on 08/06/13 showed NSR, poor r wave progression, cannot rule out anterior infarct (age undetermined).  Currently, there are no comparison EKGs in [place] or Epic. PCP is listed as Dr. Arlyss Queen with [facility] whose notes are in [place] as well.  CXR on 05/21/13 showed: Probable linear scarring or atelectasis at the left lung base.  Preoperative labs noted.    Patient with morbid obesity history, but otherwise no known CAD, MI, CHF, DM, HTN, or smoking history.  No CV symptoms documented at her PAT visit. Further evaluation by her assigned anesthesiologist on the day of surgery, but if no acute changes or new worrisome CV symptoms then I would anticipate that she could proceed as planned.  George Hugh [facility] Short Stay Center/Anesthesiology Phone [phone] 08/09/2013 3:48 PM

## 2013-08-13 NOTE — H&P (Signed)
TOTAL KNEE ADMISSION H&P  Patient is being admitted for left total knee arthroplasty.  Subjective:  Chief Complaint:left knee pain.  HPI: Charlene Tapia, 63 y.o. female, has a history of pain and functional disability in the left knee due to arthritis and has failed non-surgical conservative treatments for greater than 12 weeks to includeNSAID's and/or analgesics, use of assistive devices and activity modification.  Onset of symptoms was gradual, starting several years ago with gradually worsening course since that time. The patient noted no past surgery on the left knee(s).  Patient currently rates pain in the left knee(s) at 10 out of 10 with activity. Patient has night pain, worsening of pain with activity and weight bearing, pain that interferes with activities of daily living, pain with passive range of motion and crepitus.  Patient has evidence of subchondral sclerosis, periarticular osteophytes, joint subluxation and joint space narrowing by imaging studies.  There is no active infection.  Patient Active Problem List   Diagnosis Date Noted  . Degenerative arthritis of left knee 05/04/2013  . Obesity, unspecified 05/04/2013  . Left knee pain 11/10/2012  . Trigger middle finger of right hand 10/31/2010  . 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 TROCHANTERIC BURSITIS, RIGHT 12/09/2008   Past Medical History  Diagnosis Date  . Allergy   . Arthritis     Past Surgical History  Procedure Laterality Date  . Hand surgery Right     cyst  . Hernia repair      baby    No prescriptions prior to admission   Allergies  Allergen Reactions  . Phisohex [Hexachlorophene] Rash    Gets blisters    History  Substance Use Topics  . Smoking status: Never Smoker   . Smokeless tobacco: Never Used  . Alcohol Use: No    Family History  Problem Relation Age of Onset  . Heart disease Father   . Hypertension Father      Review of Systems   Constitutional: Negative.   HENT: Negative.   Eyes: Negative.   Respiratory: Negative.   Cardiovascular:       Heart disease and HTN  Gastrointestinal: Negative.   Genitourinary: Negative.   Musculoskeletal: Positive for joint pain.  Skin: Negative.   Neurological: Negative.   Endo/Heme/Allergies: Negative.   Psychiatric/Behavioral: Negative.     Objective:  Physical Exam  Constitutional: She is oriented to person, place, and time. She appears well-developed and well-nourished.  HENT:  Head: Normocephalic and atraumatic.  Eyes: Pupils are equal, round, and reactive to light.  Neck: Normal range of motion. Neck supple.  Cardiovascular: Intact distal pulses.   Respiratory: Effort normal.  Musculoskeletal: She exhibits tenderness.  Neurological: She is alert and oriented to person, place, and time.  Skin: Skin is warm and dry.  Psychiatric: She has a normal mood and affect. Her behavior is normal. Judgment and thought content normal.    Vital signs in last 24 hours:    Labs:   Estimated body mass index is 47.70 kg/(m^2) as calculated from the following:   Height as of 05/21/13: 5\' 4"  (1.626 m).   Weight as of 05/21/13: 126.1 kg (278 lb).   Imaging Review Radiographs:  X-rays were ordered, performed, and interpreted by me today included; AP, Rosenberg, and lateral x-rays show end-stage arthritis varus deformity both knees a full centimeter lateral subluxation of the tibia under the femur on the right, 5 mm on the left.  Assessment/Plan:  Assess: Severe end-stage arthritis,  left greater than right knee, with impending ligament damage secondary to subluxation.   The patient history, physical examination, clinical judgment of the provider and imaging studies are consistent with end stage degenerative joint disease of the left knee(s) and total knee arthroplasty is deemed medically necessary. The treatment options including medical management, injection therapy arthroscopy and  arthroplasty were discussed at length. The risks and benefits of total knee arthroplasty were presented and reviewed. The risks due to aseptic loosening, infection, stiffness, patella tracking problems, thromboembolic complications and other imponderables were discussed. The patient acknowledged the explanation, agreed to proceed with the plan and consent was signed. Patient is being admitted for inpatient treatment for surgery, pain control, PT, OT, prophylactic antibiotics, VTE prophylaxis, progressive ambulation and ADL's and discharge planning. The patient is planning to be discharged to skilled nursing facility

## 2013-08-15 DIAGNOSIS — M1712 Unilateral primary osteoarthritis, left knee: Secondary | ICD-10-CM | POA: Diagnosis present

## 2013-08-15 MED ORDER — DEXTROSE 5 % IV SOLN
3.0000 g | INTRAVENOUS | Status: DC
  Filled 2013-08-15: qty 3000

## 2013-08-15 MED ORDER — CHLORHEXIDINE GLUCONATE 4 % EX LIQD
60.0000 mL | Freq: Once | CUTANEOUS | Status: DC
  Filled 2013-08-15: qty 60

## 2013-08-16 ENCOUNTER — Encounter (HOSPITAL_COMMUNITY): Payer: 59 | Admitting: Vascular Surgery

## 2013-08-16 ENCOUNTER — Encounter (HOSPITAL_COMMUNITY)
Admission: RE | Disposition: A | Discharge status: Home Health | Payer: Self-pay | Source: Ambulatory Visit | Attending: Orthopedic Surgery

## 2013-08-16 ENCOUNTER — Encounter (HOSPITAL_COMMUNITY): Payer: Self-pay | Admitting: *Deleted

## 2013-08-16 ENCOUNTER — Ambulatory Visit (HOSPITAL_COMMUNITY): Payer: 59 | Admitting: Certified Registered Nurse Anesthetist

## 2013-08-16 ENCOUNTER — Inpatient Hospital Stay (HOSPITAL_COMMUNITY)
Admission: RE | Admit: 2013-08-16 | Discharge: 2013-08-19 | DRG: 470 | Disposition: A | Discharge status: Home Health | Payer: 59 | Source: Ambulatory Visit | Attending: Orthopedic Surgery | Admitting: Orthopedic Surgery

## 2013-08-16 DIAGNOSIS — M1712 Unilateral primary osteoarthritis, left knee: Secondary | ICD-10-CM | POA: Diagnosis present

## 2013-08-16 DIAGNOSIS — E669 Obesity, unspecified: Secondary | ICD-10-CM | POA: Diagnosis present

## 2013-08-16 DIAGNOSIS — Z79899 Other long term (current) drug therapy: Secondary | ICD-10-CM

## 2013-08-16 DIAGNOSIS — E7800 Pure hypercholesterolemia, unspecified: Secondary | ICD-10-CM | POA: Diagnosis present

## 2013-08-16 DIAGNOSIS — Z7982 Long term (current) use of aspirin: Secondary | ICD-10-CM

## 2013-08-16 DIAGNOSIS — Z8249 Family history of ischemic heart disease and other diseases of the circulatory system: Secondary | ICD-10-CM

## 2013-08-16 DIAGNOSIS — R110 Nausea: Secondary | ICD-10-CM | POA: Diagnosis not present

## 2013-08-16 DIAGNOSIS — M1710 Unilateral primary osteoarthritis, unspecified knee: Principal | ICD-10-CM | POA: Diagnosis present

## 2013-08-16 DIAGNOSIS — Z6841 Body Mass Index (BMI) 40.0 and over, adult: Secondary | ICD-10-CM

## 2013-08-16 HISTORY — PX: TOTAL KNEE ARTHROPLASTY: SHX125

## 2013-08-16 HISTORY — DX: Pure hypercholesterolemia, unspecified: E78.00

## 2013-08-16 SURGERY — ARTHROPLASTY, KNEE, TOTAL
Anesthesia: General | Site: Knee | Laterality: Left

## 2013-08-16 MED ORDER — DIPHENHYDRAMINE HCL 12.5 MG/5ML PO ELIX
12.5000 mg | ORAL_SOLUTION | ORAL | Status: DC | PRN
  Administered 2013-08-17: 25 mg via ORAL
  Filled 2013-08-16: qty 10

## 2013-08-16 MED ORDER — METHOCARBAMOL 100 MG/ML IJ SOLN
500.0000 mg | Freq: Four times a day (QID) | INTRAVENOUS | Status: DC | PRN
  Administered 2013-08-16: 500 mg via INTRAVENOUS
  Filled 2013-08-16: qty 5

## 2013-08-16 MED ORDER — 0.9 % SODIUM CHLORIDE (POUR BTL) OPTIME
TOPICAL | Status: DC | PRN
  Administered 2013-08-16: 1000 mL

## 2013-08-16 MED ORDER — FENTANYL CITRATE 0.05 MG/ML IJ SOLN
INTRAMUSCULAR | Status: AC
  Filled 2013-08-16: qty 5

## 2013-08-16 MED ORDER — ROCURONIUM BROMIDE 100 MG/10ML IV SOLN
INTRAVENOUS | Status: DC | PRN
  Administered 2013-08-16: 50 mg via INTRAVENOUS

## 2013-08-16 MED ORDER — LACTATED RINGERS IV SOLN
INTRAVENOUS | Status: DC
  Administered 2013-08-16: 09:00:00 via INTRAVENOUS

## 2013-08-16 MED ORDER — GLYCOPYRROLATE 0.2 MG/ML IJ SOLN
INTRAMUSCULAR | Status: AC
  Filled 2013-08-16: qty 4

## 2013-08-16 MED ORDER — ASPIRIN EC 325 MG PO TBEC: 325.0000 mg | DELAYED_RELEASE_TABLET | Freq: Two times a day (BID) | ORAL | Status: DC

## 2013-08-16 MED ORDER — HYDROMORPHONE HCL PF 1 MG/ML IJ SOLN
INTRAMUSCULAR | Status: AC
  Filled 2013-08-16: qty 1

## 2013-08-16 MED ORDER — ACETAMINOPHEN 325 MG PO TABS
650.0000 mg | ORAL_TABLET | Freq: Four times a day (QID) | ORAL | Status: DC | PRN
  Administered 2013-08-18: 650 mg via ORAL
  Filled 2013-08-16: qty 2

## 2013-08-16 MED ORDER — LIDOCAINE HCL (CARDIAC) 20 MG/ML IV SOLN
INTRAVENOUS | Status: AC
  Filled 2013-08-16: qty 5

## 2013-08-16 MED ORDER — DOCUSATE SODIUM 100 MG PO CAPS
100.0000 mg | ORAL_CAPSULE | Freq: Two times a day (BID) | ORAL | Status: DC
  Administered 2013-08-16 – 2013-08-19 (×7): 100 mg via ORAL
  Filled 2013-08-16 (×7): qty 1

## 2013-08-16 MED ORDER — METOCLOPRAMIDE HCL 5 MG/ML IJ SOLN: 5.0000 mg | Freq: Three times a day (TID) | INTRAMUSCULAR | Status: DC | PRN

## 2013-08-16 MED ORDER — ARTIFICIAL TEARS OP OINT
TOPICAL_OINTMENT | OPHTHALMIC | Status: DC | PRN
  Administered 2013-08-16: 1 via OPHTHALMIC

## 2013-08-16 MED ORDER — NEOSTIGMINE METHYLSULFATE 1 MG/ML IJ SOLN
INTRAMUSCULAR | Status: DC | PRN
  Administered 2013-08-16: 5 mg via INTRAVENOUS

## 2013-08-16 MED ORDER — PHENOL 1.4 % MT LIQD: 1.0000 | OROMUCOSAL | Status: DC | PRN

## 2013-08-16 MED ORDER — ASPIRIN EC 325 MG PO TBEC
325.0000 mg | DELAYED_RELEASE_TABLET | Freq: Every day | ORAL | Status: DC
  Administered 2013-08-17 – 2013-08-19 (×3): 325 mg via ORAL
  Filled 2013-08-16 (×4): qty 1

## 2013-08-16 MED ORDER — MIDAZOLAM HCL 2 MG/2ML IJ SOLN: 2.0000 mg | Freq: Once | INTRAMUSCULAR | Status: DC

## 2013-08-16 MED ORDER — GLYCOPYRROLATE 0.2 MG/ML IJ SOLN
INTRAMUSCULAR | Status: DC | PRN
  Administered 2013-08-16: .8 mg via INTRAVENOUS

## 2013-08-16 MED ORDER — OXYCODONE HCL 5 MG PO TABS
5.0000 mg | ORAL_TABLET | ORAL | Status: DC | PRN
  Administered 2013-08-17 – 2013-08-19 (×7): 10 mg via ORAL
  Filled 2013-08-16 (×8): qty 2

## 2013-08-16 MED ORDER — MENTHOL 3 MG MT LOZG: 1.0000 | LOZENGE | OROMUCOSAL | Status: DC | PRN

## 2013-08-16 MED ORDER — PROPOFOL 10 MG/ML IV BOLUS
INTRAVENOUS | Status: AC
  Filled 2013-08-16: qty 20

## 2013-08-16 MED ORDER — HYPROMELLOSE (GONIOSCOPIC) 2.5 % OP SOLN
1.0000 [drp] | Freq: Every day | OPHTHALMIC | Status: DC | PRN
  Filled 2013-08-16: qty 15

## 2013-08-16 MED ORDER — FENTANYL CITRATE 0.05 MG/ML IJ SOLN
INTRAMUSCULAR | Status: AC
  Filled 2013-08-16: qty 2

## 2013-08-16 MED ORDER — METOCLOPRAMIDE HCL 10 MG PO TABS: 5.0000 mg | ORAL_TABLET | Freq: Three times a day (TID) | ORAL | Status: DC | PRN

## 2013-08-16 MED ORDER — ONDANSETRON HCL 4 MG/2ML IJ SOLN
INTRAMUSCULAR | Status: DC | PRN
  Administered 2013-08-16: 4 mg via INTRAVENOUS

## 2013-08-16 MED ORDER — SENNOSIDES-DOCUSATE SODIUM 8.6-50 MG PO TABS: 1.0000 | ORAL_TABLET | Freq: Every evening | ORAL | Status: DC | PRN

## 2013-08-16 MED ORDER — FLUTICASONE PROPIONATE 50 MCG/ACT NA SUSP
2.0000 | Freq: Every day | NASAL | Status: DC | PRN
  Filled 2013-08-16: qty 16

## 2013-08-16 MED ORDER — OXYCODONE-ACETAMINOPHEN 5-325 MG PO TABS: 1.0000 | ORAL_TABLET | ORAL | Status: DC | PRN

## 2013-08-16 MED ORDER — PROPOFOL 10 MG/ML IV BOLUS
INTRAVENOUS | Status: DC | PRN
  Administered 2013-08-16: 200 mg via INTRAVENOUS

## 2013-08-16 MED ORDER — FENTANYL CITRATE 0.05 MG/ML IJ SOLN: 100.0000 ug | Freq: Once | INTRAMUSCULAR | Status: DC

## 2013-08-16 MED ORDER — MIDAZOLAM HCL 2 MG/2ML IJ SOLN
INTRAMUSCULAR | Status: AC
  Administered 2013-08-16: 2 mg
  Filled 2013-08-16: qty 2

## 2013-08-16 MED ORDER — BUPIVACAINE LIPOSOME 1.3 % IJ SUSP
INTRAMUSCULAR | Status: DC | PRN
  Administered 2013-08-16: 20 mL

## 2013-08-16 MED ORDER — KCL IN DEXTROSE-NACL 20-5-0.45 MEQ/L-%-% IV SOLN
INTRAVENOUS | Status: DC
Start: 2013-08-16 — End: 2013-08-19
  Administered 2013-08-16 – 2013-08-17 (×3): via INTRAVENOUS
  Filled 2013-08-16 (×11): qty 1000

## 2013-08-16 MED ORDER — LABETALOL HCL 5 MG/ML IV SOLN
INTRAVENOUS | Status: DC | PRN
  Administered 2013-08-16 (×4): 5 mg via INTRAVENOUS
  Administered 2013-08-16: 10 mg via INTRAVENOUS

## 2013-08-16 MED ORDER — FENTANYL CITRATE 0.05 MG/ML IJ SOLN
INTRAMUSCULAR | Status: DC | PRN
  Administered 2013-08-16: 150 ug via INTRAVENOUS
  Administered 2013-08-16 (×2): 100 ug via INTRAVENOUS
  Administered 2013-08-16: 50 ug via INTRAVENOUS
  Administered 2013-08-16: 100 ug via INTRAVENOUS

## 2013-08-16 MED ORDER — ROCURONIUM BROMIDE 50 MG/5ML IV SOLN
INTRAVENOUS | Status: AC
  Filled 2013-08-16: qty 1

## 2013-08-16 MED ORDER — BISACODYL 5 MG PO TBEC: 5.0000 mg | DELAYED_RELEASE_TABLET | Freq: Every day | ORAL | Status: DC | PRN

## 2013-08-16 MED ORDER — LABETALOL HCL 5 MG/ML IV SOLN
INTRAVENOUS | Status: AC
  Filled 2013-08-16: qty 8

## 2013-08-16 MED ORDER — ONDANSETRON HCL 4 MG/2ML IJ SOLN: 4.0000 mg | Freq: Four times a day (QID) | INTRAMUSCULAR | Status: DC | PRN

## 2013-08-16 MED ORDER — NEOSTIGMINE METHYLSULFATE 1 MG/ML IJ SOLN
INTRAMUSCULAR | Status: AC
  Filled 2013-08-16: qty 10

## 2013-08-16 MED ORDER — TRANEXAMIC ACID 100 MG/ML IV SOLN
1000.0000 mg | INTRAVENOUS | Status: AC
  Administered 2013-08-16: 1000 mg via INTRAVENOUS
  Filled 2013-08-16: qty 10

## 2013-08-16 MED ORDER — DEXTROSE 5 % IV SOLN
3.0000 g | Freq: Once | INTRAVENOUS | Status: AC
  Administered 2013-08-16: 3 g via INTRAVENOUS
  Filled 2013-08-16: qty 3000

## 2013-08-16 MED ORDER — LACTATED RINGERS IV SOLN
INTRAVENOUS | Status: DC | PRN
  Administered 2013-08-16 (×2): via INTRAVENOUS

## 2013-08-16 MED ORDER — CEFUROXIME SODIUM 1.5 G IJ SOLR
INTRAMUSCULAR | Status: AC
  Filled 2013-08-16: qty 1.5

## 2013-08-16 MED ORDER — MIDAZOLAM HCL 5 MG/ML IJ SOLN: 2.0000 mg | Freq: Once | INTRAMUSCULAR | Status: DC

## 2013-08-16 MED ORDER — CEFUROXIME SODIUM 1.5 G IJ SOLR
INTRAMUSCULAR | Status: DC | PRN
  Administered 2013-08-16: 1.5 g

## 2013-08-16 MED ORDER — SODIUM CHLORIDE 0.9 % IR SOLN
Status: DC | PRN
  Administered 2013-08-16: 1000 mL

## 2013-08-16 MED ORDER — METHOCARBAMOL 500 MG PO TABS: 500.0000 mg | ORAL_TABLET | Freq: Two times a day (BID) | ORAL | Status: DC

## 2013-08-16 MED ORDER — LIDOCAINE HCL (CARDIAC) 20 MG/ML IV SOLN
INTRAVENOUS | Status: DC | PRN
  Administered 2013-08-16: 60 mg via INTRAVENOUS

## 2013-08-16 MED ORDER — ACETAMINOPHEN 650 MG RE SUPP: 650.0000 mg | Freq: Four times a day (QID) | RECTAL | Status: DC | PRN

## 2013-08-16 MED ORDER — HYDROMORPHONE HCL PF 1 MG/ML IJ SOLN
INTRAMUSCULAR | Status: AC
  Administered 2013-08-16: 1 mg via INTRAVENOUS
  Filled 2013-08-16: qty 2

## 2013-08-16 MED ORDER — KCL IN DEXTROSE-NACL 20-5-0.45 MEQ/L-%-% IV SOLN
INTRAVENOUS | Status: AC
  Filled 2013-08-16: qty 1000

## 2013-08-16 MED ORDER — ONDANSETRON HCL 4 MG/2ML IJ SOLN
INTRAMUSCULAR | Status: AC
  Filled 2013-08-16: qty 2

## 2013-08-16 MED ORDER — FLEET ENEMA 7-19 GM/118ML RE ENEM: 1.0000 | ENEMA | Freq: Once | RECTAL | Status: AC | PRN

## 2013-08-16 MED ORDER — METOCLOPRAMIDE HCL 5 MG/ML IJ SOLN
10.0000 mg | Freq: Once | INTRAMUSCULAR | Status: AC
  Administered 2013-08-16: 10 mg via INTRAVENOUS

## 2013-08-16 MED ORDER — METOCLOPRAMIDE HCL 5 MG/ML IJ SOLN
INTRAMUSCULAR | Status: AC
Start: 2013-08-16 — End: 2013-08-17
  Filled 2013-08-16: qty 2

## 2013-08-16 MED ORDER — MIDAZOLAM HCL 2 MG/2ML IJ SOLN
INTRAMUSCULAR | Status: AC
  Filled 2013-08-16: qty 2

## 2013-08-16 MED ORDER — ALUM & MAG HYDROXIDE-SIMETH 200-200-20 MG/5ML PO SUSP
30.0000 mL | ORAL | Status: DC | PRN
  Filled 2013-08-16: qty 30

## 2013-08-16 MED ORDER — METHOCARBAMOL 500 MG PO TABS
500.0000 mg | ORAL_TABLET | Freq: Four times a day (QID) | ORAL | Status: DC | PRN
  Administered 2013-08-16 – 2013-08-19 (×7): 500 mg via ORAL
  Filled 2013-08-16 (×8): qty 1

## 2013-08-16 MED ORDER — ONDANSETRON HCL 4 MG PO TABS: 4.0000 mg | ORAL_TABLET | Freq: Four times a day (QID) | ORAL | Status: DC | PRN

## 2013-08-16 MED ORDER — BUPIVACAINE LIPOSOME 1.3 % IJ SUSP
20.0000 mL | Freq: Once | INTRAMUSCULAR | Status: AC
  Administered 2013-08-16: 20 mL
  Filled 2013-08-16: qty 20

## 2013-08-16 MED ORDER — HYDROMORPHONE HCL PF 1 MG/ML IJ SOLN
1.0000 mg | INTRAMUSCULAR | Status: DC | PRN
  Administered 2013-08-16 – 2013-08-17 (×6): 1 mg via INTRAVENOUS
  Filled 2013-08-16 (×6): qty 1

## 2013-08-16 MED ORDER — SODIUM CHLORIDE 0.9 % IJ SOLN
INTRAMUSCULAR | Status: DC | PRN
  Administered 2013-08-16: 40 mL via INTRAVENOUS

## 2013-08-16 MED ORDER — HYDROMORPHONE HCL PF 1 MG/ML IJ SOLN
0.2500 mg | INTRAMUSCULAR | Status: DC | PRN
  Administered 2013-08-16 (×2): 0.5 mg via INTRAVENOUS
  Administered 2013-08-16: 1 mg via INTRAVENOUS

## 2013-08-16 SURGICAL SUPPLY — 62 items
BANDAGE ESMARK 6X9 LF (GAUZE/BANDAGES/DRESSINGS) ×1 IMPLANT
BLADE SAG 18X100X1.27 (BLADE) ×3 IMPLANT
BLADE SAW SGTL 13X75X1.27 (BLADE) ×3 IMPLANT
BLADE SURG ROTATE 9660 (MISCELLANEOUS) IMPLANT
BNDG CMPR 9X6 STRL LF SNTH (GAUZE/BANDAGES/DRESSINGS) ×1
BNDG CMPR MED 10X6 ELC LF (GAUZE/BANDAGES/DRESSINGS) ×1
BNDG ELASTIC 6X10 VLCR STRL LF (GAUZE/BANDAGES/DRESSINGS) ×3 IMPLANT
BNDG ESMARK 6X9 LF (GAUZE/BANDAGES/DRESSINGS) ×3
BOWL SMART MIX CTS (DISPOSABLE) ×3 IMPLANT
CAPT RP KNEE ×2 IMPLANT
CEMENT HV SMART SET (Cement) ×6 IMPLANT
COVER SURGICAL LIGHT HANDLE (MISCELLANEOUS) ×3 IMPLANT
CUFF TOURNIQUET SINGLE 34IN LL (TOURNIQUET CUFF) IMPLANT
CUFF TOURNIQUET SINGLE 44IN (TOURNIQUET CUFF) ×2 IMPLANT
DRAPE EXTREMITY T 121X128X90 (DRAPE) ×3 IMPLANT
DRAPE U-SHAPE 47X51 STRL (DRAPES) ×3 IMPLANT
DRSG PAD ABDOMINAL 8X10 ST (GAUZE/BANDAGES/DRESSINGS) ×2 IMPLANT
DURAPREP 26ML APPLICATOR (WOUND CARE) ×4 IMPLANT
ELECT REM PT RETURN 9FT ADLT (ELECTROSURGICAL) ×3
ELECTRODE REM PT RTRN 9FT ADLT (ELECTROSURGICAL) ×1 IMPLANT
EVACUATOR 1/8 PVC DRAIN (DRAIN) ×3 IMPLANT
GAUZE XEROFORM 1X8 LF (GAUZE/BANDAGES/DRESSINGS) ×3 IMPLANT
GLOVE BIO SURGEON STRL SZ7.5 (GLOVE) ×3 IMPLANT
GLOVE BIO SURGEON STRL SZ8.5 (GLOVE) ×3 IMPLANT
GLOVE BIOGEL PI IND STRL 8 (GLOVE) ×1 IMPLANT
GLOVE BIOGEL PI IND STRL 9 (GLOVE) ×1 IMPLANT
GLOVE BIOGEL PI INDICATOR 8 (GLOVE) ×2
GLOVE BIOGEL PI INDICATOR 9 (GLOVE) ×2
GOWN STRL REUS W/ TWL LRG LVL3 (GOWN DISPOSABLE) ×1 IMPLANT
GOWN STRL REUS W/ TWL XL LVL3 (GOWN DISPOSABLE) ×2 IMPLANT
GOWN STRL REUS W/TWL LRG LVL3 (GOWN DISPOSABLE)
GOWN STRL REUS W/TWL XL LVL3 (GOWN DISPOSABLE) ×9
HANDPIECE INTERPULSE COAX TIP (DISPOSABLE) ×3
HOOD PEEL AWAY FACE SHEILD DIS (HOOD) ×6 IMPLANT
KIT BASIN OR (CUSTOM PROCEDURE TRAY) ×3 IMPLANT
KIT ROOM TURNOVER OR (KITS) ×3 IMPLANT
MANIFOLD NEPTUNE II (INSTRUMENTS) ×3 IMPLANT
NDL SAFETY ECLIPSE 18X1.5 (NEEDLE) IMPLANT
NDL SPNL 18GX3.5 QUINCKE PK (NEEDLE) IMPLANT
NEEDLE 22X1 1/2 (OR ONLY) (NEEDLE) ×1 IMPLANT
NEEDLE HYPO 18GX1.5 SHARP (NEEDLE)
NEEDLE SPNL 18GX3.5 QUINCKE PK (NEEDLE) ×3 IMPLANT
NS IRRIG 1000ML POUR BTL (IV SOLUTION) ×3 IMPLANT
PACK TOTAL JOINT (CUSTOM PROCEDURE TRAY) ×3 IMPLANT
PAD ARMBOARD 7.5X6 YLW CONV (MISCELLANEOUS) ×6 IMPLANT
PADDING CAST COTTON 6X4 STRL (CAST SUPPLIES) ×3 IMPLANT
SET HNDPC FAN SPRY TIP SCT (DISPOSABLE) ×1 IMPLANT
SPONGE GAUZE 4X4 12PLY (GAUZE/BANDAGES/DRESSINGS) ×4 IMPLANT
STAPLER VISISTAT 35W (STAPLE) ×3 IMPLANT
SUCTION FRAZIER TIP 10 FR DISP (SUCTIONS) ×1 IMPLANT
SUT VIC AB 0 CTX 36 (SUTURE) ×3
SUT VIC AB 0 CTX36XBRD ANTBCTR (SUTURE) ×1 IMPLANT
SUT VIC AB 1 CTX 36 (SUTURE)
SUT VIC AB 1 CTX36XBRD ANBCTR (SUTURE) ×1 IMPLANT
SUT VIC AB 2-0 CT1 27 (SUTURE) ×3
SUT VIC AB 2-0 CT1 TAPERPNT 27 (SUTURE) ×1 IMPLANT
SUT VICRYL 0 CT 1 36IN (SUTURE) ×6 IMPLANT
SYR 30ML LL (SYRINGE) ×1 IMPLANT
SYR 50ML LL SCALE MARK (SYRINGE) ×3 IMPLANT
TOWEL OR 17X24 6PK STRL BLUE (TOWEL DISPOSABLE) ×3 IMPLANT
TOWEL OR 17X26 10 PK STRL BLUE (TOWEL DISPOSABLE) ×3 IMPLANT
WATER STERILE IRR 1000ML POUR (IV SOLUTION) ×6 IMPLANT

## 2013-08-16 NOTE — Anesthesia Postprocedure Evaluation (Signed)
  Anesthesia Post-op Note  Patient: Charlene Tapia  Procedure(s) Performed: Procedure(s): TOTAL KNEE ARTHROPLASTY (Left)  Patient Location: PACU  Anesthesia Type:General  Level of Consciousness: awake  Airway and Oxygen Therapy: Patient Spontanous Breathing  Post-op Pain: mild  Post-op Assessment: Post-op Vital signs reviewed  Post-op Vital Signs: Reviewed  Complications: No apparent anesthesia complications 

## 2013-08-16 NOTE — Progress Notes (Signed)
Utilization review completed.  

## 2013-08-16 NOTE — Anesthesia Preprocedure Evaluation (Signed)
Anesthesia Evaluation  Patient identified by MRN, date of birth, ID band Patient awake    Reviewed: Allergy & Precautions, H&P , NPO status , Patient's Chart, lab work & pertinent test results, reviewed documented beta blocker date and time   Airway Mallampati: I TM Distance: >3 FB Neck ROM: Full    Dental  (+) Teeth Intact, Dental Advisory Given   Pulmonary          Cardiovascular     Neuro/Psych    GI/Hepatic   Endo/Other    Renal/GU      Musculoskeletal   Abdominal   Peds  Hematology   Anesthesia Other Findings   Reproductive/Obstetrics                           Anesthesia Physical Anesthesia Plan  ASA: II  Anesthesia Plan: General   Post-op Pain Management:    Induction: Intravenous  Airway Management Planned: Oral ETT  Additional Equipment:   Intra-op Plan:   Post-operative Plan: Extubation in OR  Informed Consent: I have reviewed the patients History and Physical, chart, labs and discussed the procedure including the risks, benefits and alternatives for the proposed anesthesia with the patient or authorized representative who has indicated his/her understanding and acceptance.   Dental advisory given  Plan Discussed with: CRNA, Anesthesiologist and Surgeon  Anesthesia Plan Comments:         Anesthesia Quick Evaluation

## 2013-08-16 NOTE — Preoperative (Signed)
Beta Blockers   Reason not to administer Beta Blockers:Not Applicable 

## 2013-08-16 NOTE — Anesthesia Postprocedure Evaluation (Signed)
  Anesthesia Post-op Note  Patient: Charlene Tapia  Procedure(s) Performed: Procedure(s): TOTAL KNEE ARTHROPLASTY (Left)  Patient Location: PACU  Anesthesia Type:General  Level of Consciousness: awake  Airway and Oxygen Therapy: Patient Spontanous Breathing  Post-op Pain: mild  Post-op Assessment: Post-op Vital signs reviewed  Post-op Vital Signs: Reviewed  Complications: No apparent anesthesia complications

## 2013-08-16 NOTE — Op Note (Signed)
PATIENT ID:      Charlene Tapia  MRN:     [id number] DOB/AGE:    1951/03/26 / 63 y.o.       OPERATIVE REPORT    DATE OF PROCEDURE:  08/16/2013       PREOPERATIVE DIAGNOSIS:   OSTEOARTHRITIS LEFT KNEE      Estimated body mass index is 47.87 kg/(m^2) as calculated from the following:   Height as of 08/06/13: 5\' 4"  (1.626 m).   Weight as of this encounter: 126.554 kg (279 lb).                                                        POSTOPERATIVE DIAGNOSIS:   OA left knee                                                                      PROCEDURE:  Procedure(s): TOTAL KNEE ARTHROPLASTY Using Depuy Sigma RP implants #3L Femur, #4Tibia, 63mm sigma RP bearing, 35 Patella     SURGEON: Maggi Hershkowitz Tapia    ASSISTANT:   [employer]   (Present and scrubbed throughout the case, critical for assistance with exposure, retraction, instrumentation, and closure.)         ANESTHESIA: GET Exparel  DRAINS: foley, 2 medium hemovac in knee   TOURNIQUET TIME: 00XFG   COMPLICATIONS:  None     SPECIMENS: None   INDICATIONS FOR PROCEDURE: The patient has  OSTEOARTHRITIS LEFT KNEE, varus deformities, XR shows bone on bone arthritis. Patient has failed all conservative measures including anti-inflammatory medicines, narcotics, attempts at  exercise and weight loss, cortisone injections and viscosupplementation.  Risks and benefits of surgery have been discussed, questions answered.   DESCRIPTION OF PROCEDURE: The patient identified by armband, received  IV antibiotics, in the holding area at [facility]. Patient taken to the operating room, appropriate anesthetic  monitors were attached, and general endotracheal anesthesia induced with  the patient in supine position, Foley catheter was inserted. Tourniquet  applied high to the operative thigh. Lateral post and foot positioner  applied to the table, the lower extremity was then prepped and draped  in usual sterile fashion from the ankle to  the tourniquet. Time-out procedure was performed. The limb was wrapped with an Esmarch bandage and the tourniquet inflated to 350 mmHg. We began the operation by making the anterior midline incision starting at handbreadth above the patella going over the patella 1 cm medial to and  4 cm distal to the tibial tubercle. Small bleeders in the skin and the  subcutaneous tissue identified and cauterized. Transverse retinaculum was incised and reflected medially and a medial parapatellar arthrotomy was accomplished. the patella was everted and theprepatellar fat pad resected. The superficial medial collateral  ligament was then elevated from anterior to posterior along the proximal  flare of the tibia and anterior half of the menisci resected. The knee was hyperflexed exposing bone on bone arthritis. Peripheral and notch osteophytes as well as the cruciate ligaments were then resected. We continued to  work our way around posteriorly along  the proximal tibia, and externally  rotated the tibia subluxing it out from underneath the femur. A McHale  retractor was placed through the notch and a lateral Hohmann retractor  placed, and we then drilled through the proximal tibia in line with the  axis of the tibia followed by an intramedullary guide rod and 2-degree  posterior slope cutting guide. The tibial cutting guide was pinned into place  allowing resection of 1 mm of bone medially and about 11 mm of bone  laterally because of her varus deformity. Satisfied with the tibial resection, we then  entered the distal femur 2 mm anterior to the PCL origin with the  intramedullary guide rod and applied the distal femoral cutting guide  set at 57mm, with 5 degrees of valgus. This was pinned along the  epicondylar axis. At this point, the distal femoral cut was accomplished without difficulty. We then sized for a #3L femoral component and pinned the guide in 3 degrees of external rotation.The chamfer cutting guide was  pinned into place. The anterior, posterior, and chamfer cuts were accomplished without difficulty followed by  the Sigma RP box cutting guide and the box cut. We also removed posterior osteophytes from the posterior femoral condyles. At this  time, the knee was brought into full extension. We checked our  extension and flexion gaps and found them symmetric at 94mm.  The patella thickness measured at 26 mm. We set the cutting guide at 17 and removed the posterior 9 mm  of the patella, sized for a 35 button and drilled the lollipop. The knee  was then once again hyperflexed exposing the proximal tibia. We sized for a #4 tibial base plate, applied the smokestack and the conical reamer followed by the the Delta fin keel punch. We then hammered into place the Sigma RP trial femoral component, inserted a 10-mm trial bearing, trial patellar button, and took the knee through range of motion from 0-130 degrees. No thumb pressure was required for patellar  tracking. At this point, all trial components were removed, a double batch of DePuy HV cement with 1500 mg of Zinacef was mixed and applied to all bony metallic mating surfaces except for the posterior condyles of the femur itself. In order, we  hammered into place the tibial tray and removed excess cement, the femoral component and removed excess cement, a 10-mm Sigma RP bearing  was inserted, and the knee brought to full extension with compression.  The patellar button was clamped into place, and excess cement  removed. While the cement cured the wound was irrigated out with normal saline solution pulse lavage, and medium Hemovac drains were placed from an anterolateral  approach. Ligament stability and patellar tracking were checked and found to be excellent. The parapatellar arthrotomy was closed with  running #1 Vicryl suture. The subcutaneous tissue with 0 and 2-0 undyed  Vicryl suture, and the skin with skin staples. A dressing of Xeroform,  4 x 4,  dressing sponges, Webril, and Ace wrap applied. The patient  awakened, extubated, and taken to recovery room without difficulty.   Charlene Tapia 08/16/2013, 11:28 AM

## 2013-08-16 NOTE — Progress Notes (Signed)
Orthopedic Tech Progress Note Patient Details:  Charlene Tapia Jun 17, 1950 [id number]  CPM Left Knee CPM Left Knee: On Left Knee Flexion (Degrees): 60 Left Knee Extension (Degrees): 0   Irish Elders 08/16/2013, 12:45 PM

## 2013-08-16 NOTE — Transfer of Care (Signed)
Immediate Anesthesia Transfer of Care Note  Patient: Charlene Tapia  Procedure(s) Performed: Procedure(s): TOTAL KNEE ARTHROPLASTY (Left)  Patient Location: PACU  Anesthesia Type:GA combined with regional for post-op pain  Level of Consciousness: awake and alert   Airway & Oxygen Therapy: Patient Spontanous Breathing and Patient connected to nasal cannula oxygen  Post-op Assessment: Report given to PACU RN and Post -op Vital signs reviewed and stable  Post vital signs: Reviewed and stable  Complications: No apparent anesthesia complications

## 2013-08-16 NOTE — Plan of Care (Signed)
Problem: Consults Goal: Diagnosis- Total Joint Replacement Primary Total Knee Left     

## 2013-08-16 NOTE — Anesthesia Procedure Notes (Addendum)
Procedure Name: Intubation Date/Time: 08/16/2013 9:56 AM Performed by: Ollen Bowl Pre-anesthesia Checklist: Patient identified, Timeout performed, Emergency Drugs available, Suction available and Patient being monitored Patient Re-evaluated:Patient Re-evaluated prior to inductionOxygen Delivery Method: Circle system utilized and Simple face mask Intubation Type: IV induction Ventilation: Mask ventilation without difficulty Laryngoscope Size: Mac and 3 Grade View: Grade I Tube type: Oral Tube size: 7.0 mm Number of attempts: 1 Airway Equipment and Method: Patient positioned with wedge pillow and Stylet Placement Confirmation: ETT inserted through vocal cords under direct vision,  positive ETCO2 and breath sounds checked- equal and bilateral Secured at: 24 cm Tube secured with: Tape Dental Injury: Teeth and Oropharynx as per pre-operative assessment    Anesthesia Regional Block:  Adductor canal block  Pre-Anesthetic Checklist: ,, timeout performed, Correct Patient, Correct Site, Correct Laterality, Correct Procedure, Correct Position, site marked, Risks and benefits discussed,  Surgical consent,  Pre-op evaluation,  At surgeon's request and post-op pain management  Laterality: Left  Prep: chloraprep       Needles:   Needle Type: Echogenic Stimulator Needle          Additional Needles:  Procedures: Doppler guided Adductor canal block Narrative:  Start time: 08/16/2013 9:10 AM End time: 08/16/2013 9:30 AM Injection made incrementally with aspirations every 5 mL.  Performed by: Personally  Anesthesiologist: Dr. Oletta Lamas

## 2013-08-16 NOTE — Interval H&P Note (Signed)
History and Physical Interval Note:  08/16/2013 9:00 AM  Charlene Tapia  has presented today for surgery, with the diagnosis of OSTEOARTHRITIS LEFT KNEE  The various methods of treatment have been discussed with the patient and family. After consideration of risks, benefits and other options for treatment, the patient has consented to  Procedure(s): TOTAL KNEE ARTHROPLASTY (Left) as a surgical intervention .  The patient's history has been reviewed, patient examined, no change in status, stable for surgery.  I have reviewed the patient's chart and labs.  Questions were answered to the patient's satisfaction.     Kerin Salen

## 2013-08-17 ENCOUNTER — Encounter (HOSPITAL_COMMUNITY): Payer: Self-pay | Admitting: Orthopedic Surgery

## 2013-08-17 LAB — CBC
HEMATOCRIT: 34.4 % — AB (ref 36.0–46.0)
Hemoglobin: 11.5 g/dL — ABNORMAL LOW (ref 12.0–15.0)
MCH: 30 pg (ref 26.0–34.0)
MCHC: 33.4 g/dL (ref 30.0–36.0)
MCV: 89.8 fL (ref 78.0–100.0)
Platelets: 187 10*3/uL (ref 150–400)
RBC: 3.83 MIL/uL — AB (ref 3.87–5.11)
RDW: 13.1 % (ref 11.5–15.5)
WBC: 9.7 10*3/uL (ref 4.0–10.5)

## 2013-08-17 LAB — GLUCOSE, CAPILLARY: GLUCOSE-CAPILLARY: 131 mg/dL — AB (ref 70–99)

## 2013-08-17 NOTE — Evaluation (Signed)
Physical Therapy Evaluation Patient Details Name: Charlene Tapia MRN: [id number] DOB: 07-25-1950 Today's Date: 08/17/2013   History of Present Illness  Pt is a 63 y/o female admitted s/p L TKA.   Clinical Impression  Pt is s/p TKA resulting in the deficits listed below (see PT Problem List). At the time of PT eval, pt able to ambulate 50 feet with the RW, and mod assist for transfers. Pt will benefit from skilled PT to increase their independence and safety with mobility to allow discharge to the venue listed below.    Follow Up Recommendations Home health PT    Equipment Recommendations  Rolling walker with 5" wheels;3in1 (PT)    Recommendations for Other Services       Precautions / Restrictions Precautions Precautions: Fall;Knee Precaution Comments: Discussed towel roll under heel and NO pillow under knee.  Restrictions Weight Bearing Restrictions: Yes LLE Weight Bearing: Weight bearing as tolerated      Mobility  Bed Mobility               General bed mobility comments: Pt received sitting up in the recliner.   Transfers Overall transfer level: Needs assistance Equipment used: Rolling walker (2 wheeled) Transfers: Sit to/from Stand Sit to Stand: Mod assist         General transfer comment: VC's for hand placement on seated surface for safety. Multiple attempts to achieve standing.   Ambulation/Gait Ambulation/Gait assistance: Min guard Ambulation Distance (Feet): 50 Feet Assistive device: Rolling walker (2 wheeled) Gait Pattern/deviations: Step-to pattern;Step-through pattern;Decreased stride length Gait velocity: Decreased Gait velocity interpretation: Below normal speed for age/gender General Gait Details: VC's for sequencing and safety awareness with the RW. Specific cues for increased heel strike, improved posture.  Stairs            Wheelchair Mobility    Modified Rankin (Stroke Patients Only)       Balance Overall balance  assessment: Needs assistance Sitting-balance support: Feet supported;Bilateral upper extremity supported Sitting balance-Leahy Scale: Fair     Standing balance support: Bilateral upper extremity supported Standing balance-Leahy Scale: Poor                       Pertinent Vitals/Pain Pt reports 7/10 pain during ambulation, however declines offer to call RN.     Home Living Family/patient expects to be discharged to:: Private residence Living Arrangements: Spouse/significant other Available Help at Discharge: Family;Available 24 hours/day Type of Home: House Home Access: Ramped entrance     Home Layout: Two level;Able to live on main level with bedroom/bathroom Home Equipment: Kasandra Knudsen - single point;Walker - 4 wheels      Prior Function Level of Independence: Independent               Hand Dominance   Dominant Hand: Right    Extremity/Trunk Assessment   Upper Extremity Assessment: Defer to OT evaluation           Lower Extremity Assessment: LLE deficits/detail   LLE Deficits / Details: Decreased strength and AROM consistent with TKA  Cervical / Trunk Assessment: Normal  Communication   Communication: No difficulties  Cognition Arousal/Alertness: Awake/alert Behavior During Therapy: WFL for tasks assessed/performed Overall Cognitive Status: Within Functional Limits for tasks assessed                      General Comments      Exercises Total Joint Exercises Ankle Circles/Pumps: 10 reps Quad Sets: 10 reps  Assessment/Plan    PT Assessment Patient needs continued PT services  PT Diagnosis Difficulty walking;Acute pain   PT Problem List Decreased strength;Decreased range of motion;Decreased activity tolerance;Decreased balance;Decreased mobility;Decreased knowledge of use of DME;Decreased safety awareness;Decreased knowledge of precautions;Pain  PT Treatment Interventions DME instruction;Gait training;Stair training;Functional  mobility training;Therapeutic activities;Therapeutic exercise;Neuromuscular re-education;Patient/family education   PT Goals (Current goals can be found in the Care Plan section) Acute Rehab PT Goals Patient Stated Goal: To return home with her husband PT Goal Formulation: With patient Time For Goal Achievement: 08/24/13 Potential to Achieve Goals: Good    Frequency 7X/week   Barriers to discharge        End of Session Equipment Utilized During Treatment: Gait belt Activity Tolerance: Patient tolerated treatment well Patient left: in chair;with call bell/phone within reach         Time: 1000-1032 PT Time Calculation (min): 32 min   Charges:   PT Evaluation $Initial PT Evaluation Tier I: 1 Procedure PT Treatments $Gait Training: 8-22 mins $Therapeutic Exercise: 8-22 mins   PT G Codes:          Jolyn Lent 08/17/2013, 12:09 PM  Jolyn Lent, PT, DPT Acute Rehabilitation Services Pager: [phone]

## 2013-08-17 NOTE — Evaluation (Signed)
Occupational Therapy Evaluation Patient Details Name: Charlene Tapia MRN: [id number] DOB: [birth year]  Today's Date: 08/17/2013    History of Present Illness Pt is a 63 y/o female admitted s/p L TKA.    Clinical Impression   Pt presents with below problem list. Pt independent with ADLs, PTA. Feel pt will benefit from acute OT to increase independence prior to d/c.    Follow Up Recommendations  No OT follow up;Supervision - Intermittent (when OOB/mobility)    Equipment Recommendations  3 in 1 bedside commode;Tub/shower bench (unsure if pt already received this)    Recommendations for Other Services       Precautions / Restrictions Precautions Precautions: Fall;Knee Precaution Comments: Educated on precautions Restrictions Weight Bearing Restrictions: Yes LLE Weight Bearing: Weight bearing as tolerated      Mobility Bed Mobility               General bed mobility comments: Pt received sitting up in the recliner.   Transfers Overall transfer level: Needs assistance Equipment used: Rolling walker (2 wheeled) Transfers: Sit to/from Stand Sit to Stand: Min guard         General transfer comment: cues for technique.         ADL   Grooming: Wash/dry hands;Supervision/safety   Upper Body Dressing : Set up;Sitting   Lower Body Dressing: Minimal assistance;Sit to/from stand Toilet Transfer: Min guard;Ambulation (3 in 1 over toilet) Toileting- Clothing Manipulation and Hygiene: Minimal assistance;Sit to/from stand   Functional mobility during ADLs: Min guard;Rolling walker General ADL Comments: Educated on dressing technique and AE for LB ADLs. Pt practiced with reacher and sockaid. Educated on use of bag on walker to carry items. Educated on toilet aid as pt stated concern for hygiene.  Educated on safety with ADLs.     Vision                     Perception     Praxis      Pertinent Vitals/Pain Pain 5/10. Repositioned.      Hand Dominance  Right   Extremity/Trunk Assessment Upper Extremity Assessment Upper Extremity Assessment: Overall WFL for tasks assessed   Lower Extremity Assessment Lower Extremity Assessment: Defer to PT evaluation       Communication Communication Communication: No difficulties   Cognition Arousal/Alertness: Awake/alert Behavior During Therapy: WFL for tasks assessed/performed Overall Cognitive Status: Within Functional Limits for tasks assessed                     General Comments       Exercises Exercises: Total Joint    Home Living Family/patient expects to be discharged to:: Private residence Living Arrangements: Spouse/significant other Available Help at Discharge: Family;Available 24 hours/day Type of Home: House Home Access: Ramped entrance     Home Layout: Two level;Able to live on main level with bedroom/bathroom     Bathroom Shower/Tub: Tub/shower unit (may use walk-in shower)   Bathroom Toilet: Handicapped height     Home Equipment: [place] - single point;Walker - 4 wheels (says she is getting BSC)          Prior Functioning/Environment Level of Independence: Independent             OT Diagnosis:  Acute Pain   OT Problem List: Decreased strength;Decreased activity tolerance;Decreased range of motion;Decreased knowledge of use of DME or AE;Decreased knowledge of precautions;Pain;Obesity   OT Treatment/Interventions: Self-care/ADL training;DME and/or AE instruction;Therapeutic activities;Patient/family education;Balance training    OT  Goals(Current goals can be found in the care plan section) Acute Rehab OT Goals Patient Stated Goal: walking OT Goal Formulation: With patient Time For Goal Achievement: 08/24/13 Potential to Achieve Goals: Good ADL Goals Pt Will Perform Lower Body Bathing: with adaptive equipment;sit to/from stand;with supervision Pt Will Perform Lower Body Dressing: with adaptive equipment;sit to/from stand;with supervision Pt Will  Transfer to Toilet: with supervision;ambulating (3 in 1 over toilet) Pt Will Perform Toileting - Clothing Manipulation and hygiene: with modified independence;sit to/from stand Pt Will Perform Tub/Shower Transfer: Tub transfer;with supervision;ambulating;Shower transfer;rolling walker  OT Frequency: Min 2X/week   Barriers to D/C:            End of Session: Equipment Utilized During Treatment: Gait belt;Rolling walker CPM Left Knee CPM Left Knee: Off  Activity Tolerance: Patient tolerated treatment well Patient left: in chair;with call bell/phone within reach   Time: 1338-1406 OT Time Calculation (min): 28 min Charges:  OT General Charges $OT Visit: 1 Procedure OT Evaluation $Initial OT Evaluation Tier I: 1 Procedure OT Treatments $Self Care/Home Management : 8-22 mins G-CodesBenito Mccreedy OTR/L [phone] 08/17/2013, 3:32 PM

## 2013-08-17 NOTE — Progress Notes (Signed)
Physical Therapy Treatment Patient Details Name: Charlene Tapia MRN: [id number] DOB: 06/09/1950 Today's Date: 08/17/2013    History of Present Illness Pt is a 63 y/o female admitted s/p L TKA.     PT Comments    Pt progressing towards physical therapy goals. Appears fatigued during gait training this afternoon, but was motivated to improve distance and technique. Reviewed HEP. Will continue to follow.   Follow Up Recommendations  Home health PT     Equipment Recommendations  Rolling walker with 5" wheels;3in1 (PT)    Recommendations for Other Services       Precautions / Restrictions Precautions Precautions: Fall;Knee Precaution Comments: Educated on precautions Restrictions Weight Bearing Restrictions: Yes LLE Weight Bearing: Weight bearing as tolerated    Mobility  Bed Mobility               General bed mobility comments: Pt received sitting up in the recliner.   Transfers Overall transfer level: Needs assistance Equipment used: Rolling walker (2 wheeled) Transfers: Sit to/from Stand Sit to Stand: Min guard         General transfer comment: cues for technique.  Ambulation/Gait Ambulation/Gait assistance: Min guard Ambulation Distance (Feet): 75 Feet Assistive device: Rolling walker (2 wheeled) Gait Pattern/deviations: Step-to pattern;Step-through pattern;Decreased stride length Gait velocity: Decreased Gait velocity interpretation: Below normal speed for age/gender General Gait Details: VC's for sequencing and safety awareness with the RW. Specific cues for increased heel strike, improved posture.   Stairs            Wheelchair Mobility    Modified Rankin (Stroke Patients Only)       Balance Overall balance assessment: Needs assistance Sitting-balance support: Feet supported Sitting balance-Leahy Scale: Fair     Standing balance support: Bilateral upper extremity supported Standing balance-Leahy Scale: Fair                      Cognition Arousal/Alertness: Awake/alert Behavior During Therapy: WFL for tasks assessed/performed Overall Cognitive Status: Within Functional Limits for tasks assessed                      Exercises Total Joint Exercises Ankle Circles/Pumps: 10 reps Quad Sets: 10 reps Towel Squeeze: 10 reps Short Arc Quad: 10 reps Heel Slides: 10 reps Hip ABduction/ADduction: 10 reps Long Arc Quad: 10 reps    General Comments        Pertinent Vitals/Pain Pt reports increased pain after session but does not ask for pain medication.     Home Living Family/patient expects to be discharged to:: Private residence Living Arrangements: Spouse/significant other Available Help at Discharge: Family;Available 24 hours/day Type of Home: House Home Access: Ramped entrance   Home Layout: Two level;Able to live on main level with bedroom/bathroom Home Equipment: [place] - single point;Walker - 4 wheels (says she is getting BSC)      Prior Function Level of Independence: Independent          PT Goals (current goals can now be found in the care plan section) Acute Rehab PT Goals Patient Stated Goal: walking PT Goal Formulation: With patient Time For Goal Achievement: 08/24/13 Potential to Achieve Goals: Good Progress towards PT goals: Progressing toward goals    Frequency  7X/week    PT Plan Current plan remains appropriate    End of Session Equipment Utilized During Treatment: Gait belt Activity Tolerance: Patient tolerated treatment well Patient left: in chair;with call bell/phone within reach  Time: 8280-0349 PT Time Calculation (min): 41 min  Charges:  $Gait Training: 23-37 mins $Therapeutic Exercise: 8-22 mins                    G Codes:      Jolyn Lent 09/12/2013, 4:50 PM  Jolyn Lent, PT, DPT Acute Rehabilitation Services Pager: [phone]

## 2013-08-17 NOTE — Progress Notes (Signed)
Patient ID: Charlene Tapia, female   DOB: [birth year], 63 y.o.   MRN: [id number] PATIENT ID: Charlene Tapia  MRN: [id number]  DOB/AGE:  07/09/1950 / 63 y.o.  1 Day Post-Op Procedure(s) (LRB): TOTAL KNEE ARTHROPLASTY (Left)    PROGRESS NOTE Subjective: Patient is alert, oriented, x1 Nausea, no Vomiting, yes passing gas, no Bowel Movement. Taking PO well. Denies SOB, Chest or Calf Pain. Using Incentive Spirometer, PAS in place. Ambulate WBAT, CPM 0-60 Patient reports pain as 5 on 0-10 scale  .    Objective: Vital signs in last 24 hours: Filed Vitals:   08/16/13 0834 08/17/13 0000 08/17/13 0131 08/17/13 0512  BP:   145/65 143/48  Pulse:   85 94  Temp:   99.9 F (37.7 C) 100.2 F (37.9 C)  TempSrc: Oral     Resp:  18 18 18   Weight: 126.554 kg (279 lb)     SpO2:  95% 96% 96%      Intake/Output from previous day: I/O last 3 completed shifts: In: 1500 [I.V.:1500] Out: 750 [Urine:300; Drains:350; Blood:100]   Intake/Output this shift:     LABORATORY DATA: No results found for this basename: WBC, HGB, HCT, PLT, NA, K, CL, CO2, BUN, CREATININE, GLUCOSE, GLUCAP, PT, INR, CALCIUM, 2,  in the last 72 hours  Examination: Neurologically intact ABD soft Neurovascular intact Sensation intact distally Intact pulses distally Dorsiflexion/Plantar flexion intact Incision: no drainage No cellulitis present Compartment soft} Blood and plasma separated in drain indicating minimal recent drainage, drain pulled without difficulty.  Assessment:   1 Day Post-Op Procedure(s) (LRB): TOTAL KNEE ARTHROPLASTY (Left) ADDITIONAL DIAGNOSIS:  obesity  Plan: PT/OT WBAT, CPM 5/hrs day until ROM 0-90 degrees, then D/C CPM DVT Prophylaxis:  SCDx72hrs, ASA 325 mg BID x 2 weeks DISCHARGE PLAN: Home DISCHARGE NEEDS: HHPT, HHRN, CPM, Walker and 3-in-1 comode seat     Shia Eber J 08/17/2013, 7:20 AM

## 2013-08-17 NOTE — Care Management Note (Signed)
CARE MANAGEMENT NOTE 08/17/2013  Patient:  Charlene Tapia, Charlene Tapia   Account Number:  [id number]  Date Initiated:  08/17/2013  Documentation initiated by:  Ricki Miller  Subjective/Objective Assessment:   63 yr old female s/p left total knee arthroplasty.     Action/Plan:   Case manager spoke with patient concerning home health and DME needs at discharge.Choice offered. Referral called to [facility], [place]. Patient has support at discharge.   Anticipated DC Date:  08/12/2013   Anticipated DC Plan:  [facility]  CM consult      [facility] Choice  HOME HEALTH  DURABLE MEDICAL EQUIPMENT   Choice offered to / List presented to:  C-1 Patient   DME arranged  WALKER - ROLLING  CPM  3-N-1  TUB BENCH      DME agency  [facility] [place] arranged  [place].   Status of service:  Completed, signed off Medicare Important Message given?   (If response is "NO", the following Medicare IM given date fields will be blank) Date Medicare IM given:   Date Additional Medicare IM given:    Discharge Disposition:  [place]

## 2013-08-18 LAB — CBC
HCT: 32.2 % — ABNORMAL LOW (ref 36.0–46.0)
HEMOGLOBIN: 10.9 g/dL — AB (ref 12.0–15.0)
MCH: 30.3 pg (ref 26.0–34.0)
MCHC: 33.9 g/dL (ref 30.0–36.0)
MCV: 89.4 fL (ref 78.0–100.0)
Platelets: 168 10*3/uL (ref 150–400)
RBC: 3.6 MIL/uL — ABNORMAL LOW (ref 3.87–5.11)
RDW: 12.9 % (ref 11.5–15.5)
WBC: 10.9 10*3/uL — ABNORMAL HIGH (ref 4.0–10.5)

## 2013-08-18 NOTE — Progress Notes (Signed)
Physical Therapy Treatment Patient Details Name: Charlene Tapia MRN: [id number] DOB: 1950/11/04 Today's Date: 08/18/2013    History of Present Illness Pt is a 63 y/o female admitted s/p L TKA.     PT Comments    Pt progressing towards physical therapy goals. States she definitely wants to return home at d/c, and asks multiple times throughout session if therapist thought she was progressing well. Continues to move slowly with ambulation and demonstrates decreased AROM during therapeutic exercise, but overall able to manage fairly well with mobility.   Follow Up Recommendations  Home health PT     Equipment Recommendations  Rolling walker with 5" wheels;3in1 (PT)    Recommendations for Other Services       Precautions / Restrictions Precautions Precautions: Fall;Knee Precaution Comments: Discussed towel roll under heel and NO pillow under knee.  Restrictions Weight Bearing Restrictions: Yes LLE Weight Bearing: Weight bearing as tolerated    Mobility  Bed Mobility               General bed mobility comments: Pt received sitting up in the recliner.   Transfers Overall transfer level: Needs assistance Equipment used: Rolling walker (2 wheeled) Transfers: Sit to/from Stand Sit to Stand: Min guard         General transfer comment: VC's for hand placement on seated surface for safety. Increased time required to power-up to full stand.   Ambulation/Gait Ambulation/Gait assistance: Min guard Ambulation Distance (Feet): 60 Feet Assistive device: Rolling walker (2 wheeled) Gait Pattern/deviations: Step-to pattern;Step-through pattern;Decreased stride length;Trunk flexed Gait velocity: Decreased Gait velocity interpretation: Below normal speed for age/gender General Gait Details: VC's for technique. Encouraged pt to only ambulate as far as she felt she was able due to increased fatigue and soreness from yesterday's sessions.    Stairs            Wheelchair  Mobility    Modified Rankin (Stroke Patients Only)       Balance Overall balance assessment: Needs assistance Sitting-balance support: Feet supported Sitting balance-Leahy Scale: Fair     Standing balance support: Bilateral upper extremity supported Standing balance-Leahy Scale: Fair                      Cognition Arousal/Alertness: Awake/alert Behavior During Therapy: WFL for tasks assessed/performed Overall Cognitive Status: Within Functional Limits for tasks assessed                      Exercises Total Joint Exercises Ankle Circles/Pumps: 15 reps Quad Sets: 15 reps Short Arc Quad: 15 reps Heel Slides: 15 reps Hip ABduction/ADduction: 15 reps Goniometric ROM: 78 AAROM    General Comments        Pertinent Vitals/Pain Pt reports increased pain during activity, but declines call to RN for medication.     Home Living                      Prior Function            PT Goals (current goals can now be found in the care plan section) Acute Rehab PT Goals Patient Stated Goal: walking PT Goal Formulation: With patient Time For Goal Achievement: 08/24/13 Potential to Achieve Goals: Good Progress towards PT goals: Progressing toward goals    Frequency  7X/week    PT Plan Current plan remains appropriate    End of Session Equipment Utilized During Treatment: Gait belt Activity Tolerance: Patient tolerated treatment well  Patient left: in chair;with call bell/phone within reach     Time: 1022-1104 PT Time Calculation (min): 42 min  Charges:  $Gait Training: 8-22 mins $Therapeutic Exercise: 23-37 mins                    G Codes:      Jolyn Lent 2013/08/23, 11:44 AM  Jolyn Lent, PT, DPT Acute Rehabilitation Services Pager: [phone]

## 2013-08-18 NOTE — Progress Notes (Signed)
Orthopedic Tech Progress Note Patient Details:  Charlene Tapia November 14, 1950 [id number] On cpm at 6:32 pm LLE 0-60 Patient ID: Charlene Tapia, female   DOB: 1951/05/06, 63 y.o.   MRN: [id number]   Charlene Tapia 08/18/2013, 6:32 PM

## 2013-08-18 NOTE — Progress Notes (Signed)
PATIENT ID: Charlene Tapia  MRN: [id number]  DOB/AGE:  61-20-1952 / 63 y.o.  2 Days Post-Op Procedure(s) (LRB): TOTAL KNEE ARTHROPLASTY (Left)    PROGRESS NOTE Subjective: Patient is alert, oriented, no Nausea, no Vomiting, yes passing gas, no Bowel Movement. Taking PO well. Denies SOB, Chest or Calf Pain. Using Incentive Spirometer, PAS in place. Ambulate wbat, CPM 0-60 Patient reports pain as 3 on 0-10 scale  .    Objective: Vital signs in last 24 hours: Filed Vitals:   08/17/13 2030 08/17/13 2035 08/18/13 0239 08/18/13 0442  BP:  158/69  153/70  Pulse:  102  93  Temp:  100.3 F (37.9 C)  99.9 F (37.7 C)  TempSrc:      Resp:  16  18  Weight:      SpO2: 84% 95% 94% 99%      Intake/Output from previous day: I/O last 3 completed shifts: In: 28 [P.O.:2400; I.V.:1820] Out: 200 [Drains:200]   Intake/Output this shift:     LABORATORY DATA:  Recent Labs  08/17/13 0740 08/17/13 1628 08/18/13 0422  WBC 9.7  --  10.9*  HGB 11.5*  --  10.9*  HCT 34.4*  --  32.2*  PLT 187  --  168  GLUCAP  --  131*  --     Examination: Neurologically intact Neurovascular intact Sensation intact distally Intact pulses distally Dorsiflexion/Plantar flexion intact Incision: dressing C/D/I No cellulitis present Compartment soft}  Assessment:   2 Days Post-Op Procedure(s) (LRB): TOTAL KNEE ARTHROPLASTY (Left) ADDITIONAL DIAGNOSIS:  obesity  Plan: PT/OT WBAT, CPM 5/hrs day until ROM 0-90 degrees, then D/C CPM DVT Prophylaxis:  SCDx72hrs, ASA 325 mg BID x 2 weeks DISCHARGE PLAN: Home, likely tomorrow DISCHARGE NEEDS: HHPT, HHRN, CPM, Walker and 3-in-1 comode seat     Hebert Dooling R 08/18/2013, 8:25 AM

## 2013-08-18 NOTE — Progress Notes (Signed)
OT Cancellation Note  Patient Details Name: Charlene Tapia MRN: [id number] DOB: 23-Aug-1950   Cancelled Treatment:    Reason Eval/Treat Not Completed: Other (comment) (pt feels good about information covered and does not feel she needs any further OT.) OT to sign off.  Benito Mccreedy OTR/L [phone] 08/18/2013, 1:39 PM

## 2013-08-18 NOTE — Progress Notes (Addendum)
Attempted patient on room air.  O2 sat 84% on RA.  Oxygen reapplied at 2L.  Sats up to 93-94% on 2L [place].  Patient compliant with incentive spirometer and CDB.  Denies SOB or CP.    Up to [facility] with 1+ assist, urine clear amber color, needs assistance with peri-care.  Then, placed in CPM 0-60 by Ortho Tech.  Encouraged patient to call when she was ready for CPM to be increased to 70 degrees.  Pain well controlled with Oxy and Robaxin.    Patient has voiced to this RN concerns about going home, questions whether "someone her size" should go to rehab.  Concerned if her home is situated for her d/c needs (i.e. Seating, room for equipment).  Expressed anxiety about overexerting self yesterday with therapy.  Educated patient to benefits of home vs. Rehab and encouraged her to discuss concerns with MD and therapist.

## 2013-08-18 NOTE — Progress Notes (Signed)
Physical Therapy Treatment Patient Details Name: Charlene Tapia MRN: [id number] DOB: [birth year] Today's Date: 08/18/2013    History of Present Illness Pt is a 63 y/o female admitted s/p L TKA.     PT Comments    Pt progressing towards physical therapy goals. Focus of session was therapeutic exercise, and pt was able to demonstrate increased AROM this session. Pt anticipates d/c tomorrow, PT will continue to follow.   Follow Up Recommendations  Home health PT     Equipment Recommendations  Rolling walker with 5" wheels;3in1 (PT)    Recommendations for Other Services       Precautions / Restrictions Precautions Precautions: Fall;Knee Precaution Comments: Discussed towel roll under heel and NO pillow under knee.  Restrictions Weight Bearing Restrictions: Yes LLE Weight Bearing: Weight bearing as tolerated    Mobility  Bed Mobility               General bed mobility comments: Pt received sitting up in the recliner.   Transfers Overall transfer level: Needs assistance Equipment used: Rolling walker (2 wheeled) Transfers: Sit to/from Stand Sit to Stand: Min guard         General transfer comment: Pt demonstrated proper hand placement and safety awareness.  Ambulation/Gait Ambulation/Gait assistance: Min guard Ambulation Distance (Feet): 30 Feet Assistive device: Rolling walker (2 wheeled) Gait Pattern/deviations: Step-to pattern;Step-through pattern;Decreased stride length Gait velocity: Decreased Gait velocity interpretation: Below normal speed for age/gender General Gait Details: Ambulated in room. Moving slowly but with better technique.    Stairs            Wheelchair Mobility    Modified Rankin (Stroke Patients Only)       Balance Overall balance assessment: Needs assistance Sitting-balance support: Feet supported Sitting balance-Leahy Scale: Fair     Standing balance support: Bilateral upper extremity supported Standing  balance-Leahy Scale: Fair                      Cognition Arousal/Alertness: Awake/alert Behavior During Therapy: WFL for tasks assessed/performed Overall Cognitive Status: Within Functional Limits for tasks assessed                      Exercises Total Joint Exercises Ankle Circles/Pumps: 15 reps Quad Sets: 15 reps Short Arc Quad: 15 reps Heel Slides: 15 reps Hip ABduction/ADduction: 15 reps Long Arc Quad: 15 reps (Decreased ROM) Knee Flexion: 5 reps Goniometric ROM: 82 AROM    General Comments        Pertinent Vitals/Pain Pt reports minimal pain throughout session.     Home Living                      Prior Function            PT Goals (current goals can now be found in the care plan section) Acute Rehab PT Goals Patient Stated Goal: walking PT Goal Formulation: With patient Time For Goal Achievement: 08/24/13 Potential to Achieve Goals: Good    Frequency  7X/week    PT Plan Current plan remains appropriate    Co-evaluation             End of Session Equipment Utilized During Treatment: Gait belt Activity Tolerance: Patient tolerated treatment well Patient left: in chair;with call bell/phone within reach     Time: 1345-1414 PT Time Calculation (min): 29 min  Charges:  $Gait Training: 8-22 mins $Therapeutic Exercise: 8-22 mins  G CodesJolyn Tapia 09-05-13, 4:08 PM  Charlene Tapia, PT, DPT Acute Rehabilitation Services Pager: [phone]

## 2013-08-18 NOTE — Progress Notes (Signed)
Patient in deep sleep after pain meds given at 4pm and "exhausting day of therapy."  Patient also noted to be awake most of previous night contributing to drowsiness.  Upon awakening and checking O2 sat on room air, sat was 84%.  Temp 100.3  Oxygen applied at 2L and patient using incentive spirometer with sats up to 95%.  Continuous pulse ox reapplied also.  HOB elevated.  Lungs clear but diminished.  Continue to monitor.

## 2013-08-19 LAB — CBC
HCT: 32.9 % — ABNORMAL LOW (ref 36.0–46.0)
Hemoglobin: 11.2 g/dL — ABNORMAL LOW (ref 12.0–15.0)
MCH: 30.4 pg (ref 26.0–34.0)
MCHC: 34 g/dL (ref 30.0–36.0)
MCV: 89.4 fL (ref 78.0–100.0)
PLATELETS: 173 10*3/uL (ref 150–400)
RBC: 3.68 MIL/uL — AB (ref 3.87–5.11)
RDW: 12.8 % (ref 11.5–15.5)
WBC: 10 10*3/uL (ref 4.0–10.5)

## 2013-08-19 NOTE — Progress Notes (Signed)
Physical Therapy Treatment Patient Details Name: Charlene Tapia MRN: [id number] DOB: 09-28-1950 Today's Date: 08/19/2013    History of Present Illness Pt is a 63 y/o female admitted s/p L TKA.     PT Comments    Pt progressing towards physical therapy goals. Reviewed HEP and car transfer with pt, and she states she has no further questions. Anticipate pt will d/c today, with HHPT to follow at home.   Follow Up Recommendations  Home health PT     Equipment Recommendations  Rolling walker with 5" wheels;3in1 (PT)    Recommendations for Other Services       Precautions / Restrictions Precautions Precautions: Fall;Knee Precaution Comments: Discussed towel roll under heel and NO pillow under knee.  Restrictions Weight Bearing Restrictions: Yes LLE Weight Bearing: Weight bearing as tolerated    Mobility  Bed Mobility               General bed mobility comments: Pt received sitting up in the recliner.   Transfers Overall transfer level: Needs assistance Equipment used: Rolling walker (2 wheeled) Transfers: Sit to/from Stand Sit to Stand: Supervision         General transfer comment: Pt demonstrated proper hand placement and safety awareness.  Ambulation/Gait Ambulation/Gait assistance: Supervision;Min guard Ambulation Distance (Feet): 75 Feet Assistive device: Rolling walker (2 wheeled) Gait Pattern/deviations: Step-to pattern;Step-through pattern;Decreased stride length Gait velocity: Decreased Gait velocity interpretation: Below normal speed for age/gender General Gait Details: Encouraged step-through gait pattern and fluidity of walker movement. Pt showing improvement with heel strike and weight bearing on the operative LE.    Stairs            Wheelchair Mobility    Modified Rankin (Stroke Patients Only)       Balance Overall balance assessment: Needs assistance Sitting-balance support: Feet supported Sitting balance-Leahy Scale: Fair     Standing balance support: No upper extremity supported Standing balance-Leahy Scale: Fair                      Cognition Arousal/Alertness: Awake/alert Behavior During Therapy: WFL for tasks assessed/performed Overall Cognitive Status: Within Functional Limits for tasks assessed                      Exercises Total Joint Exercises Heel Slides: 15 reps Goniometric ROM: 91 AROM    General Comments General comments (skin integrity, edema, etc.): Reviewed HEP and discussed technique, sets/reps/frequency       Pertinent Vitals/Pain Pt reports minimal pain throughout session.     Home Living                      Prior Function            PT Goals (current goals can now be found in the care plan section) Acute Rehab PT Goals Patient Stated Goal: walking PT Goal Formulation: With patient Time For Goal Achievement: 08/24/13 Potential to Achieve Goals: Good Progress towards PT goals: Progressing toward goals    Frequency  7X/week    PT Plan Current plan remains appropriate    Co-evaluation             End of Session Equipment Utilized During Treatment: Gait belt Activity Tolerance: Patient tolerated treatment well Patient left: in chair;with call bell/phone within reach     Time: 1120-1150 PT Time Calculation (min): 30 min  Charges:  $Gait Training: 8-22 mins $Therapeutic Activity: 8-22 mins  G CodesJolyn Lent 08-22-2013, 12:06 PM  Jolyn Lent, PT, DPT Acute Rehabilitation Services Pager: [phone]

## 2013-08-19 NOTE — Progress Notes (Signed)
Patient ID: Charlene Tapia, female   DOB: 1951/03/16, 63 y.o.   MRN: [id number] PATIENT ID: Charlene Tapia  MRN: [id number]  DOB/AGE:  Dec 26, 1950 / 63 y.o.  3 Days Post-Op Procedure(s) (LRB): TOTAL KNEE ARTHROPLASTY (Left)    PROGRESS NOTE Subjective: Patient is alert, oriented, no Nausea, no Vomiting, yes passing gas, yes Bowel Movement. Taking PO well. Denies SOB, Chest or Calf Pain. Using Incentive Spirometer, PAS in place. Ambulate patient ambulated in the hallway, will the steps today., CPM RANGE OF motion is 0-95 CPM is now optional Patient reports pain as 4 on 0-10 scale  .    Objective: Vital signs in last 24 hours: Filed Vitals:   08/18/13 1841 08/18/13 2116 08/19/13 0300 08/19/13 0604  BP:  140/71  122/53  Pulse:  95  100  Temp: 98.8 F (37.1 C) 98.2 F (36.8 C)  98.1 F (36.7 C)  TempSrc:      Resp:  16  16  Height:   5\' 4"  (1.626 m)   Weight:   126.554 kg (279 lb)   SpO2:  94%  98%      Intake/Output from previous day: I/O last 3 completed shifts: In: 6389 [P.O.:1561; I.V.:80] Out: 650 [Urine:650]   Intake/Output this shift: Total I/O In: 240 [Other:240] Out: -    LABORATORY DATA:  Recent Labs  08/17/13 0740 08/17/13 1628 08/18/13 0422  WBC 9.7  --  10.9*  HGB 11.5*  --  10.9*  HCT 34.4*  --  32.2*  PLT 187  --  168  GLUCAP  --  131*  --     Examination: Neurologically intact ABD soft Neurovascular intact Sensation intact distally Intact pulses distally Dorsiflexion/Plantar flexion intact Incision: no drainage No cellulitis present Compartment soft}  Assessment:   3 Days Post-Op Procedure(s) (LRB): TOTAL KNEE ARTHROPLASTY (Left) ADDITIONAL DIAGNOSIS:    Plan: PT/OT WBAT, CPM 5/hrs day until ROM 0-90 degrees, then D/C CPM DVT Prophylaxis:  SCDx72hrs, ASA 325 mg BID x 2 weeks DISCHARGE PLAN: Home, today DISCHARGE NEEDS: HHPT, HHRN, CPM, Walker and 3-in-1 comode seat     Ashea Winiarski J 08/19/2013, 8:43 AM

## 2013-08-19 NOTE — Discharge Summary (Signed)
Patient ID: Charlene Tapia MRN: [id number] DOB/AGE: 1950-07-19 63 y.o.  Admit date: 08/16/2013 Discharge date: 08/19/2013  Admission Diagnoses:  Principal Problem:   Arthritis of knee, left Active Problems:   Arthritis of left knee   Discharge Diagnoses:  Same  Past Medical History  Diagnosis Date  . Allergy   . High cholesterol   . Arthritis     "knees; thumbs" (08/16/2013)    Surgeries: Procedure(s): Left TOTAL KNEE ARTHROPLASTY on 08/16/2013   Consultants:    Discharged Condition: Improved  Hospital Course: Charlene Tapia is an 63 y.o. female who was admitted 08/16/2013 for operative treatment ofArthritis of knee, left. Patient has severe unremitting pain that affects sleep, daily activities, and work/hobbies. After pre-op clearance the patient was taken to the operating room on 08/16/2013 and underwent  Procedure(s): TOTAL KNEE ARTHROPLASTY.    Patient was given perioperative antibiotics: Anti-infectives   Start     Dose/Rate Route Frequency Ordered Stop   08/16/13 2000  ceFAZolin (ANCEF) 3 g in dextrose 5 % 50 mL IVPB     3 g 160 mL/hr over 30 Minutes Intravenous  Once 08/16/13 1637 08/16/13 2228   08/16/13 1026  cefUROXime (ZINACEF) injection  Status:  Discontinued       As needed 08/16/13 1026 08/16/13 1207   08/15/13 1100  ceFAZolin (ANCEF) 3 g in dextrose 5 % 50 mL IVPB  Status:  Discontinued     3 g 160 mL/hr over 30 Minutes Intravenous On call to O.R. 08/15/13 1100 08/16/13 1637       Patient was given sequential compression devices, early ambulation, and chemoprophylaxis to prevent DVT.  Patient benefited maximally from hospital stay and there were no complications.    Recent vital signs: Patient Vitals for the past 24 hrs:  BP Temp Pulse Resp SpO2 Height Weight  08/19/13 0604 122/53 mmHg 98.1 F (36.7 C) 100 16 98 % - -  08/19/13 0300 - - - - - 5\' 4"  (1.626 m) 126.554 kg (279 lb)  08/18/13 2116 140/71 mmHg 98.2 F (36.8 C) 95 16 94 % - -   08/18/13 1841 - 98.8 F (37.1 C) - - - - -  08/18/13 1530 123/60 mmHg 101.3 F (38.5 C) 92 16 93 % - -     Recent laboratory studies:  Recent Labs  08/17/13 0740 08/18/13 0422  WBC 9.7 10.9*  HGB 11.5* 10.9*  HCT 34.4* 32.2*  PLT 187 168     Discharge Medications:     Medication List    STOP taking these medications       acetaminophen 325 MG tablet  Commonly known as:  TYLENOL     meloxicam 15 MG tablet  Commonly known as:  MOBIC      TAKE these medications       aspirin EC 325 MG tablet  Take 1 tablet (325 mg total) by mouth 2 (two) times daily.     fluticasone 50 MCG/ACT nasal spray  Commonly known as:  FLONASE  Place 2 sprays into both nostrils daily as needed for allergies or rhinitis.     hydroxypropyl methylcellulose 2.5 % ophthalmic solution  Commonly known as:  ISOPTO TEARS  Place 1-2 drops into both eyes daily as needed for dry eyes.     methocarbamol 500 MG tablet  Commonly known as:  ROBAXIN  Take 1 tablet (500 mg total) by mouth 2 (two) times daily with a meal.     oxyCODONE-acetaminophen 5-325 MG per tablet  Commonly known as:  ROXICET  Take 1 tablet by mouth every 4 (four) hours as needed.     traMADol 50 MG tablet  Commonly known as:  ULTRAM  Take 1 tablet (50 mg total) by mouth at bedtime as needed for pain.        Diagnostic Studies: No results found.  Disposition: 01-Home or Self Care      Discharge Orders   Future Orders Complete By Expires   Call MD / Call 911  As directed    Comments:     If you experience chest pain or shortness of breath, CALL 911 and be transported to the hospital emergency room.  If you develope a fever above 101 F, pus (white drainage) or increased drainage or redness at the wound, or calf pain, call your surgeon's office.   Change dressing  As directed    Comments:     Change dressing on POD #5.  You may clean the incision with alcohol prior to redressing.   Constipation Prevention  As directed     Comments:     Drink plenty of fluids.  Prune juice may be helpful.  You may use a stool softener, such as Colace (over the counter) 100 mg twice a day.  Use MiraLax (over the counter) for constipation as needed.   CPM  As directed    Comments:     Continuous passive motion machine (CPM):      Use the CPM from 0 to 60 for 5 hours per day.      You may increase by 10 degrees per day.  You may break it up into 2 or 3 sessions per day.      Use CPM for 2 weeks or until you are told to stop.   Diet - low sodium heart healthy  As directed    Do not put a pillow under the knee. Place it under the heel.  As directed    Driving restrictions  As directed    Comments:     No driving for 2 weeks   Increase activity slowly as tolerated  As directed    Patient may shower  As directed    Comments:     You may shower without a dressing once there is no drainage.  Do not wash over the wound.  If drainage remains, cover wound with plastic wrap and then shower.      Follow-up Information   Follow up with Kerin Salen, MD In 2 weeks.   Specialty:  Orthopedic Surgery   Contact information:   [place] [address] [phone]       Follow up with [facility]. (Someone from [facility] will contact you concerning physical therapy start date and time.)    Contact information:   [address] [place] [address] [phone]       Follow up with Kerin Salen, MD In 2 weeks.   Specialty:  Orthopedic Surgery   Contact information:   [place] [address] [phone]       Follow up with Kerin Salen, MD In 2 weeks.   Specialty:  Orthopedic Surgery   Contact information:   [place] [address] [phone]        Signed: Kerin Salen 08/19/2013, 8:47 AM

## 2013-08-19 NOTE — Progress Notes (Signed)
Came to see patient prior to discharge on behalf of Link to Wellness program for [employer] employees/dependents with [employer]. Explained program to patient. [name] states she does not have any current Link to Wellness needs and does not need a post hospital follow up phone call. Appreciative of visit however. Left brochure and contact information at bedside for her to call if needed. Inpatient RN case manager aware of visit.  Marthenia Rolling, MSN- [facility] [phone]

## 2013-09-08 ENCOUNTER — Other Ambulatory Visit: Payer: Self-pay | Admitting: Orthopedic Surgery

## 2013-09-21 ENCOUNTER — Encounter (HOSPITAL_COMMUNITY): Payer: Self-pay | Admitting: Pharmacy Technician

## 2013-09-22 ENCOUNTER — Encounter (HOSPITAL_COMMUNITY): Payer: Self-pay

## 2013-09-22 ENCOUNTER — Ambulatory Visit (HOSPITAL_COMMUNITY): Admission: RE | Admit: 2013-09-22 | Payer: 59 | Source: Ambulatory Visit

## 2013-09-22 ENCOUNTER — Encounter (HOSPITAL_COMMUNITY)
Admission: RE | Admit: 2013-09-22 | Discharge: 2013-09-22 | Disposition: A | Discharge status: Home / Self Care | Payer: 59 | Source: Ambulatory Visit | Attending: Orthopedic Surgery | Admitting: Orthopedic Surgery

## 2013-09-22 DIAGNOSIS — Z01812 Encounter for preprocedural laboratory examination: Secondary | ICD-10-CM | POA: Insufficient documentation

## 2013-09-22 LAB — URINALYSIS, ROUTINE W REFLEX MICROSCOPIC
Bilirubin Urine: NEGATIVE
GLUCOSE, UA: NEGATIVE mg/dL
Hgb urine dipstick: NEGATIVE
Ketones, ur: NEGATIVE mg/dL
NITRITE: NEGATIVE
PROTEIN: NEGATIVE mg/dL
Specific Gravity, Urine: 1.022 (ref 1.005–1.030)
Urobilinogen, UA: 0.2 mg/dL (ref 0.0–1.0)
pH: 6.5 (ref 5.0–8.0)

## 2013-09-22 LAB — CBC WITH DIFFERENTIAL/PLATELET
Basophils Absolute: 0.1 10*3/uL (ref 0.0–0.1)
Basophils Relative: 1 % (ref 0–1)
Eosinophils Absolute: 0.3 10*3/uL (ref 0.0–0.7)
Eosinophils Relative: 5 % (ref 0–5)
HCT: 38.7 % (ref 36.0–46.0)
HEMOGLOBIN: 12.7 g/dL (ref 12.0–15.0)
LYMPHS ABS: 1.8 10*3/uL (ref 0.7–4.0)
Lymphocytes Relative: 27 % (ref 12–46)
MCH: 29.2 pg (ref 26.0–34.0)
MCHC: 32.8 g/dL (ref 30.0–36.0)
MCV: 89 fL (ref 78.0–100.0)
Monocytes Absolute: 0.5 10*3/uL (ref 0.1–1.0)
Monocytes Relative: 8 % (ref 3–12)
NEUTROS ABS: 3.8 10*3/uL (ref 1.7–7.7)
NEUTROS PCT: 59 % (ref 43–77)
Platelets: 223 10*3/uL (ref 150–400)
RBC: 4.35 MIL/uL (ref 3.87–5.11)
RDW: 12.8 % (ref 11.5–15.5)
WBC: 6.5 10*3/uL (ref 4.0–10.5)

## 2013-09-22 LAB — URINE MICROSCOPIC-ADD ON

## 2013-09-22 LAB — PROTIME-INR
INR: 0.97 (ref 0.00–1.49)
PROTHROMBIN TIME: 12.7 s (ref 11.6–15.2)

## 2013-09-22 LAB — TYPE AND SCREEN
ABO/RH(D): O POS
Antibody Screen: NEGATIVE

## 2013-09-22 LAB — BASIC METABOLIC PANEL
BUN: 13 mg/dL (ref 6–23)
CO2: 25 mEq/L (ref 19–32)
Calcium: 9.6 mg/dL (ref 8.4–10.5)
Chloride: 102 mEq/L (ref 96–112)
Creatinine, Ser: 0.66 mg/dL (ref 0.50–1.10)
GLUCOSE: 99 mg/dL (ref 70–99)
POTASSIUM: 4.5 meq/L (ref 3.7–5.3)
Sodium: 141 mEq/L (ref 137–147)

## 2013-09-22 LAB — APTT: aPTT: 28 seconds (ref 24–37)

## 2013-09-22 LAB — SURGICAL PCR SCREEN
MRSA, PCR: NEGATIVE
Staphylococcus aureus: NEGATIVE

## 2013-09-22 NOTE — Progress Notes (Signed)
Allergy to phisohex but patient stated "no problems with chg wash " in march

## 2013-09-22 NOTE — Pre-Procedure Instructions (Addendum)
Charlene Tapia  09/22/2013   Your procedure is scheduled on:  09/29/13  Report to [facility] short stay admitting at 1045 AM.  Call this number if you have problems the morning of surgery: [phone]   Remember:   Do not eat food or drink liquids after midnight.   Take these medicines the morning of surgery with A SIP OF WATER: pain med if needed         Take all meds as ordered until day of surgery except as instructed below or per dr    Bridgette Habermann all herbel meds, nsaids (aleve,naproxen,advil,ibuprofen) 5 days prior to surgery including vitamins, aspirin   Do not wear jewelry, make-up or nail polish.  Do not wear lotions, powders, or perfumes. You may wear deodorant.  Do not shave 48 hours prior to surgery. Men may shave face and neck.  Do not bring valuables to the hospital.  [facility] is not responsible                  for any belongings or valuables.               Contacts, dentures or bridgework may not be worn into surgery.  Leave suitcase in the car. After surgery it may be brought to your room.  For patients admitted to the hospital, discharge time is determined by your                treatment team.               Patients discharged the day of surgery will not be allowed to drive  home.  Name and phone number of your driver:  Special Instructions:  Special Instructions: [place] - Preparing for Surgery  Before surgery, you can play an important role.  Because skin is not sterile, your skin needs to be as free of germs as possible.  You can reduce the number of germs on you skin by washing with CHG (chlorahexidine gluconate) soap before surgery.  CHG is an antiseptic cleaner which kills germs and bonds with the skin to continue killing germs even after washing.  Please DO NOT use if you have an allergy to CHG or antibacterial soaps.  If your skin becomes reddened/irritated stop using the CHG and inform your nurse when you arrive at Short Stay.  Do not shave (including  legs and underarms) for at least 48 hours prior to the first CHG shower.  You may shave your face.  Please follow these instructions carefully:   1.  Shower with CHG Soap the night before surgery and the morning of Surgery.  2.  If you choose to wash your hair, wash your hair first as usual with your normal shampoo.  3.  After you shampoo, rinse your hair and body thoroughly to remove the Shampoo.  4.  Use CHG as you would any other liquid soap.  You can apply chg directly  to the skin and wash gently with scrungie or a clean washcloth.  5.  Apply the CHG Soap to your body ONLY FROM THE NECK DOWN.  Do not use on open wounds or open sores.  Avoid contact with your eyes ears, mouth and genitals (private parts).  Wash genitals (private parts)       with your normal soap.  6.  Wash thoroughly, paying special attention to the area where your surgery will be performed.  7.  Thoroughly rinse your body with warm water from the  neck down.  8.  DO NOT shower/wash with your normal soap after using and rinsing off the CHG Soap.  9.  Pat yourself dry with a clean towel.            10.  Wear clean pajamas.            11.  Place clean sheets on your bed the night of your first shower and do not sleep with pets.  Day of Surgery  Do not apply any lotions/deodorants the morning of surgery.  Please wear clean clothes to the hospital/surgery center.   Please read over the following fact sheets that you were given: Pain Booklet, Coughing and Deep Breathing, Blood Transfusion Information, MRSA Information and Surgical Site Infection Prevention

## 2013-09-23 NOTE — Progress Notes (Signed)
Anesthesia Chart Review: Patient is a 63 year old female scheduled for right TKA on 09/29/13 by Dr. Mayer Camel.  She is s/p left TKA on 08/16/13. Other history includes non-smoker, arthritis. BMI is listed as 45.48 consistent with morbid obesity. PCP is Dr. Remo Lipps Dabu with [facility] and [facility]   EKG on 08/06/13 showed NSR, poor r wave progression, cannot rule out anterior infarct (age undetermined). Currently, there are no comparison EKGs in [place] or Epic.    CXR on 05/21/13 showed: Probable linear scarring or atelectasis at the left lung base.   Preoperative noted.  She tolerated similar procedure two months ago. If no acute changes then I would anticipate that she could proceed as planned.   George Hugh [facility] Short Stay Center/Anesthesiology Phone [phone] 09/23/2013 6:51 PM

## 2013-09-28 MED ORDER — DEXTROSE 5 % IV SOLN
3.0000 g | INTRAVENOUS | Status: AC
  Administered 2013-09-29: 3 g via INTRAVENOUS
  Filled 2013-09-28: qty 3000

## 2013-09-28 MED ORDER — CHLORHEXIDINE GLUCONATE 4 % EX LIQD
60.0000 mL | Freq: Once | CUTANEOUS | Status: DC
  Filled 2013-09-28: qty 60

## 2013-09-28 MED ORDER — DEXTROSE-NACL 5-0.45 % IV SOLN: INTRAVENOUS | Status: DC

## 2013-09-28 NOTE — H&P (Signed)
TOTAL KNEE ADMISSION H&P  Patient is being admitted for right total knee arthroplasty.  Subjective:  Chief Complaint:right knee pain.  HPI: Charlene Tapia, 63 y.o. female, has a history of pain and functional disability in the right knee due to arthritis and has failed non-surgical conservative treatments for greater than 12 weeks to includeNSAID's and/or analgesics, flexibility and strengthening excercises, use of assistive devices and activity modification.  Onset of symptoms was gradual, starting several years ago with gradually worsening course since that time. The patient noted no past surgery on the right knee(s).  Patient currently rates pain in the right knee(s) at 10 out of 10 with activity. Patient has worsening of pain with activity and weight bearing, pain that interferes with activities of daily living, pain with passive range of motion and crepitus.  Patient has evidence of subchondral sclerosis, periarticular osteophytes, joint subluxation and joint space narrowing by imaging studies.  There is no active infection.  Patient Active Problem List   Diagnosis Date Noted  . Arthritis of left knee 08/16/2013  . Arthritis of knee, left 08/15/2013  . Degenerative arthritis of left knee 05/04/2013  . Obesity, unspecified 05/04/2013  . Left knee pain 11/10/2012  . Trigger middle finger of right hand 10/31/2010  . 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 TROCHANTERIC BURSITIS, RIGHT 12/09/2008   Past Medical History  Diagnosis Date  . Allergy   . High cholesterol   . Arthritis     "knees; thumbs" (08/16/2013)    Past Surgical History  Procedure Laterality Date  . Ganglion cyst excision Right 1990's  . Inguinal hernia repair Bilateral 1954    "age 63 month"  . Total knee arthroplasty Left 08/16/2013  . Total knee arthroplasty Left 08/16/2013    Procedure: TOTAL KNEE ARTHROPLASTY;  Surgeon: Kerin Salen, MD;  Location: [place];  Service:  Orthopedics;  Laterality: Left;    No prescriptions prior to admission   Allergies  Allergen Reactions  . Phisohex [Hexachlorophene] Rash    Gets blisters    History  Substance Use Topics  . Smoking status: Never Smoker   . Smokeless tobacco: Never Used  . Alcohol Use: Yes     Comment: 08/16/2013 "GLASS OF WINE < ONCE/MONTH"    Family History  Problem Relation Age of Onset  . Heart disease Father   . Hypertension Father      Review of Systems  Constitutional: Negative.   HENT: Negative.   Eyes: Negative.   Respiratory: Negative.   Cardiovascular: Negative.   Gastrointestinal: Negative.   Genitourinary: Negative.   Musculoskeletal: Positive for joint pain.  Skin: Negative.   Neurological: Negative.   Endo/Heme/Allergies: Negative.   Psychiatric/Behavioral: Negative.     Objective:  Physical Exam  Constitutional: She is oriented to person, place, and time. She appears well-developed and well-nourished.  HENT:  Head: Normocephalic and atraumatic.  Eyes: Pupils are equal, round, and reactive to light.  Neck: Normal range of motion. Neck supple.  Cardiovascular: Intact distal pulses.   Respiratory: Effort normal.  Musculoskeletal: She exhibits tenderness.  Neurological: She is alert and oriented to person, place, and time.  Skin: Skin is warm and dry.  Psychiatric: She has a normal mood and affect. Her behavior is normal. Judgment and thought content normal.    Vital signs in last 24 hours:    Labs:   Estimated body mass index is 47.94 kg/(m^2) as calculated from the following:   Height as of 08/19/13: 5'  4" (1.626 m).   Weight as of 08/06/13: 126.735 kg (279 lb 6.4 oz).   Imaging Review Radiographs:  X-rays were ordered, performed, and interpreted by me today included; AP, Rosenberg, and lateral x-rays show end-stage arthritis varus deformity both knees a full centimeter lateral subluxation of the tibia under the femur on the right, 5 mm on the  left.  Assessment/Plan:  End stage arthritis, right knee   The patient history, physical examination, clinical judgment of the provider and imaging studies are consistent with end stage degenerative joint disease of the right knee(s) and total knee arthroplasty is deemed medically necessary. The treatment options including medical management, injection therapy arthroscopy and arthroplasty were discussed at length. The risks and benefits of total knee arthroplasty were presented and reviewed. The risks due to aseptic loosening, infection, stiffness, patella tracking problems, thromboembolic complications and other imponderables were discussed. The patient acknowledged the explanation, agreed to proceed with the plan and consent was signed. Patient is being admitted for inpatient treatment for surgery, pain control, PT, OT, prophylactic antibiotics, VTE prophylaxis, progressive ambulation and ADL's and discharge planning. The patient is planning to be discharged home with home health services

## 2013-09-29 ENCOUNTER — Ambulatory Visit (HOSPITAL_COMMUNITY): Payer: 59 | Admitting: Anesthesiology

## 2013-09-29 ENCOUNTER — Encounter (HOSPITAL_COMMUNITY)
Admission: RE | Disposition: A | Discharge status: Home Health | Payer: Self-pay | Source: Ambulatory Visit | Attending: Orthopedic Surgery

## 2013-09-29 ENCOUNTER — Encounter (HOSPITAL_COMMUNITY): Payer: Self-pay | Admitting: *Deleted

## 2013-09-29 ENCOUNTER — Encounter (HOSPITAL_COMMUNITY): Payer: 59 | Admitting: Vascular Surgery

## 2013-09-29 ENCOUNTER — Inpatient Hospital Stay (HOSPITAL_COMMUNITY)
Admission: RE | Admit: 2013-09-29 | Discharge: 2013-10-02 | DRG: 470 | Disposition: A | Discharge status: Home Health | Payer: 59 | Source: Ambulatory Visit | Attending: Orthopedic Surgery | Admitting: Orthopedic Surgery

## 2013-09-29 DIAGNOSIS — Z6841 Body Mass Index (BMI) 40.0 and over, adult: Secondary | ICD-10-CM

## 2013-09-29 DIAGNOSIS — M1710 Unilateral primary osteoarthritis, unspecified knee: Principal | ICD-10-CM | POA: Diagnosis present

## 2013-09-29 DIAGNOSIS — M1711 Unilateral primary osteoarthritis, right knee: Secondary | ICD-10-CM | POA: Diagnosis present

## 2013-09-29 DIAGNOSIS — E669 Obesity, unspecified: Secondary | ICD-10-CM | POA: Diagnosis present

## 2013-09-29 HISTORY — PX: TOTAL KNEE ARTHROPLASTY: SHX125

## 2013-09-29 HISTORY — DX: Unspecified osteoarthritis, unspecified site: M19.90

## 2013-09-29 SURGERY — ARTHROPLASTY, KNEE, TOTAL
Anesthesia: General | Laterality: Right

## 2013-09-29 MED ORDER — LIDOCAINE HCL (CARDIAC) 20 MG/ML IV SOLN
INTRAVENOUS | Status: DC | PRN
  Administered 2013-09-29: 100 mg via INTRAVENOUS

## 2013-09-29 MED ORDER — LACTATED RINGERS IV SOLN
INTRAVENOUS | Status: DC
  Administered 2013-09-29: 11:00:00 via INTRAVENOUS

## 2013-09-29 MED ORDER — MAGNESIUM CITRATE PO SOLN: 1.0000 | Freq: Once | ORAL | Status: AC | PRN

## 2013-09-29 MED ORDER — SODIUM CHLORIDE 0.9 % IJ SOLN
INTRAMUSCULAR | Status: DC | PRN
  Administered 2013-09-29: 40 mL via INTRAVENOUS

## 2013-09-29 MED ORDER — SUFENTANIL CITRATE 50 MCG/ML IV SOLN
INTRAVENOUS | Status: AC
  Filled 2013-09-29: qty 1

## 2013-09-29 MED ORDER — BISACODYL 5 MG PO TBEC: 5.0000 mg | DELAYED_RELEASE_TABLET | Freq: Every day | ORAL | Status: DC | PRN

## 2013-09-29 MED ORDER — METOCLOPRAMIDE HCL 10 MG PO TABS
5.0000 mg | ORAL_TABLET | Freq: Three times a day (TID) | ORAL | Status: DC | PRN
  Administered 2013-09-30: 10 mg via ORAL
  Filled 2013-09-29: qty 1

## 2013-09-29 MED ORDER — ONDANSETRON HCL 4 MG/2ML IJ SOLN: 4.0000 mg | Freq: Once | INTRAMUSCULAR | Status: DC | PRN

## 2013-09-29 MED ORDER — SODIUM CHLORIDE 0.9 % IV SOLN
1000.0000 mg | INTRAVENOUS | Status: AC
  Administered 2013-09-29: 1000 mg via INTRAVENOUS
  Filled 2013-09-29: qty 10

## 2013-09-29 MED ORDER — SENNOSIDES-DOCUSATE SODIUM 8.6-50 MG PO TABS: 1.0000 | ORAL_TABLET | Freq: Every evening | ORAL | Status: DC | PRN

## 2013-09-29 MED ORDER — HYDROMORPHONE HCL PF 1 MG/ML IJ SOLN
0.2500 mg | INTRAMUSCULAR | Status: DC | PRN
  Administered 2013-09-29 (×2): 0.5 mg via INTRAVENOUS

## 2013-09-29 MED ORDER — SODIUM CHLORIDE 0.9 % IJ SOLN
INTRAMUSCULAR | Status: AC
  Filled 2013-09-29: qty 10

## 2013-09-29 MED ORDER — OXYCODONE HCL 5 MG PO TABS
ORAL_TABLET | ORAL | Status: AC
  Filled 2013-09-29: qty 1

## 2013-09-29 MED ORDER — ONDANSETRON HCL 4 MG/2ML IJ SOLN
4.0000 mg | Freq: Four times a day (QID) | INTRAMUSCULAR | Status: DC | PRN
Start: 2013-09-29 — End: 2013-10-02
  Administered 2013-09-29 – 2013-09-30 (×2): 4 mg via INTRAVENOUS
  Filled 2013-09-29 (×2): qty 2

## 2013-09-29 MED ORDER — SODIUM CHLORIDE 0.9 % IR SOLN
Status: DC | PRN
  Administered 2013-09-29: 3000 mL

## 2013-09-29 MED ORDER — ACETAMINOPHEN 325 MG PO TABS: 650.0000 mg | ORAL_TABLET | Freq: Four times a day (QID) | ORAL | Status: DC | PRN

## 2013-09-29 MED ORDER — ASPIRIN EC 325 MG PO TBEC
325.0000 mg | DELAYED_RELEASE_TABLET | Freq: Two times a day (BID) | ORAL | Status: DC
Start: 2013-09-29 — End: 2018-07-23

## 2013-09-29 MED ORDER — MIDAZOLAM HCL 5 MG/5ML IJ SOLN
INTRAMUSCULAR | Status: DC | PRN
  Administered 2013-09-29: 2 mg via INTRAVENOUS

## 2013-09-29 MED ORDER — KCL IN DEXTROSE-NACL 20-5-0.45 MEQ/L-%-% IV SOLN
INTRAVENOUS | Status: DC
  Administered 2013-09-29: 22:00:00 via INTRAVENOUS
  Filled 2013-09-29 (×10): qty 1000

## 2013-09-29 MED ORDER — BUPIVACAINE LIPOSOME 1.3 % IJ SUSP
20.0000 mL | Freq: Once | INTRAMUSCULAR | Status: DC
  Filled 2013-09-29: qty 20

## 2013-09-29 MED ORDER — ONDANSETRON HCL 4 MG PO TABS
4.0000 mg | ORAL_TABLET | Freq: Four times a day (QID) | ORAL | Status: DC | PRN
  Administered 2013-09-30 – 2013-10-02 (×2): 4 mg via ORAL
  Filled 2013-09-29 (×2): qty 1

## 2013-09-29 MED ORDER — DIPHENHYDRAMINE HCL 12.5 MG/5ML PO ELIX: 12.5000 mg | ORAL_SOLUTION | ORAL | Status: DC | PRN

## 2013-09-29 MED ORDER — OXYCODONE-ACETAMINOPHEN 5-325 MG PO TABS: 1.0000 | ORAL_TABLET | ORAL | Status: DC | PRN

## 2013-09-29 MED ORDER — ALUM & MAG HYDROXIDE-SIMETH 200-200-20 MG/5ML PO SUSP: 30.0000 mL | ORAL | Status: DC | PRN

## 2013-09-29 MED ORDER — HYDROMORPHONE HCL PF 1 MG/ML IJ SOLN
INTRAMUSCULAR | Status: AC
  Filled 2013-09-29: qty 1

## 2013-09-29 MED ORDER — SODIUM CHLORIDE 0.9 % IJ SOLN
INTRAMUSCULAR | Status: AC
  Filled 2013-09-29: qty 40

## 2013-09-29 MED ORDER — MIDAZOLAM HCL 2 MG/2ML IJ SOLN
INTRAMUSCULAR | Status: AC
  Filled 2013-09-29: qty 2

## 2013-09-29 MED ORDER — BUPIVACAINE-EPINEPHRINE (PF) 0.25% -1:200000 IJ SOLN
INTRAMUSCULAR | Status: AC
  Filled 2013-09-29: qty 30

## 2013-09-29 MED ORDER — DOCUSATE SODIUM 100 MG PO CAPS
100.0000 mg | ORAL_CAPSULE | Freq: Two times a day (BID) | ORAL | Status: DC
  Administered 2013-09-29 – 2013-10-02 (×6): 100 mg via ORAL
  Filled 2013-09-29 (×7): qty 1

## 2013-09-29 MED ORDER — OXYCODONE HCL 5 MG/5ML PO SOLN: 5.0000 mg | Freq: Once | ORAL | Status: AC | PRN

## 2013-09-29 MED ORDER — OXYCODONE HCL 5 MG PO TABS
5.0000 mg | ORAL_TABLET | Freq: Once | ORAL | Status: AC | PRN
  Administered 2013-09-29: 5 mg via ORAL

## 2013-09-29 MED ORDER — PHENOL 1.4 % MT LIQD: 1.0000 | OROMUCOSAL | Status: DC | PRN

## 2013-09-29 MED ORDER — HYDROMORPHONE HCL PF 1 MG/ML IJ SOLN
1.0000 mg | INTRAMUSCULAR | Status: DC | PRN
  Administered 2013-09-29 – 2013-09-30 (×2): 1 mg via INTRAVENOUS
  Filled 2013-09-29 (×2): qty 1

## 2013-09-29 MED ORDER — PROPOFOL 10 MG/ML IV BOLUS
INTRAVENOUS | Status: DC | PRN
  Administered 2013-09-29: 200 mg via INTRAVENOUS

## 2013-09-29 MED ORDER — LACTATED RINGERS IV SOLN
INTRAVENOUS | Status: DC | PRN
  Administered 2013-09-29: 11:00:00 via INTRAVENOUS

## 2013-09-29 MED ORDER — ASPIRIN EC 325 MG PO TBEC
325.0000 mg | DELAYED_RELEASE_TABLET | Freq: Every day | ORAL | Status: DC
  Administered 2013-09-30 – 2013-10-02 (×3): 325 mg via ORAL
  Filled 2013-09-29 (×4): qty 1

## 2013-09-29 MED ORDER — CEFUROXIME SODIUM 1.5 G IJ SOLR
INTRAMUSCULAR | Status: AC
  Filled 2013-09-29: qty 1.5

## 2013-09-29 MED ORDER — METHOCARBAMOL 500 MG PO TABS: 500.0000 mg | ORAL_TABLET | Freq: Two times a day (BID) | ORAL | Status: DC | PRN

## 2013-09-29 MED ORDER — METHOCARBAMOL 500 MG PO TABS
500.0000 mg | ORAL_TABLET | Freq: Four times a day (QID) | ORAL | Status: DC | PRN
  Administered 2013-09-29 – 2013-10-02 (×8): 500 mg via ORAL
  Filled 2013-09-29 (×8): qty 1

## 2013-09-29 MED ORDER — LABETALOL HCL 5 MG/ML IV SOLN
INTRAVENOUS | Status: DC | PRN
  Administered 2013-09-29 (×2): 5 mg via INTRAVENOUS

## 2013-09-29 MED ORDER — FENTANYL CITRATE 0.05 MG/ML IJ SOLN
INTRAMUSCULAR | Status: DC | PRN
  Administered 2013-09-29: 50 ug via INTRAVENOUS
  Administered 2013-09-29: 100 ug via INTRAVENOUS
  Administered 2013-09-29 (×2): 50 ug via INTRAVENOUS

## 2013-09-29 MED ORDER — LIDOCAINE HCL (CARDIAC) 20 MG/ML IV SOLN
INTRAVENOUS | Status: AC
  Filled 2013-09-29: qty 5

## 2013-09-29 MED ORDER — MENTHOL 3 MG MT LOZG: 1.0000 | LOZENGE | OROMUCOSAL | Status: DC | PRN

## 2013-09-29 MED ORDER — METHOCARBAMOL 1000 MG/10ML IJ SOLN: 500.0000 mg | Freq: Four times a day (QID) | INTRAMUSCULAR | Status: DC | PRN

## 2013-09-29 MED ORDER — MEPERIDINE HCL 25 MG/ML IJ SOLN: 6.2500 mg | INTRAMUSCULAR | Status: DC | PRN

## 2013-09-29 MED ORDER — FLUTICASONE PROPIONATE 50 MCG/ACT NA SUSP: 2.0000 | Freq: Every day | NASAL | Status: DC | PRN

## 2013-09-29 MED ORDER — PHENYLEPHRINE 40 MCG/ML (10ML) SYRINGE FOR IV PUSH (FOR BLOOD PRESSURE SUPPORT)
PREFILLED_SYRINGE | INTRAVENOUS | Status: AC
  Filled 2013-09-29: qty 10

## 2013-09-29 MED ORDER — SUFENTANIL CITRATE 50 MCG/ML IV SOLN
INTRAVENOUS | Status: DC | PRN
  Administered 2013-09-29: 25 ug via INTRAVENOUS
  Administered 2013-09-29: 10 ug via INTRAVENOUS
  Administered 2013-09-29: 5 ug via INTRAVENOUS
  Administered 2013-09-29: 10 ug via INTRAVENOUS

## 2013-09-29 MED ORDER — METOCLOPRAMIDE HCL 5 MG/ML IJ SOLN
5.0000 mg | Freq: Three times a day (TID) | INTRAMUSCULAR | Status: DC | PRN
  Administered 2013-09-30: 10 mg via INTRAVENOUS
  Filled 2013-09-29: qty 2

## 2013-09-29 MED ORDER — BUPIVACAINE LIPOSOME 1.3 % IJ SUSP
INTRAMUSCULAR | Status: DC | PRN
  Administered 2013-09-29: 20 mL

## 2013-09-29 MED ORDER — ACETAMINOPHEN 650 MG RE SUPP: 650.0000 mg | Freq: Four times a day (QID) | RECTAL | Status: DC | PRN

## 2013-09-29 MED ORDER — ARTIFICIAL TEARS OP OINT
TOPICAL_OINTMENT | Freq: Two times a day (BID) | OPHTHALMIC | Status: DC
  Filled 2013-09-29: qty 3.5

## 2013-09-29 MED ORDER — OXYCODONE HCL 5 MG PO TABS
5.0000 mg | ORAL_TABLET | ORAL | Status: DC | PRN
  Administered 2013-09-29 – 2013-10-01 (×8): 5 mg via ORAL
  Administered 2013-10-02 (×3): 10 mg via ORAL
  Filled 2013-09-29 (×5): qty 1
  Filled 2013-09-29 (×2): qty 2
  Filled 2013-09-29 (×3): qty 1
  Filled 2013-09-29: qty 2

## 2013-09-29 MED ORDER — SODIUM CHLORIDE 0.9 % IJ SOLN
INTRAMUSCULAR | Status: AC
  Filled 2013-09-29: qty 12

## 2013-09-29 MED ORDER — BUPIVACAINE-EPINEPHRINE (PF) 0.5% -1:200000 IJ SOLN
INTRAMUSCULAR | Status: DC | PRN
  Administered 2013-09-29: 30 mL

## 2013-09-29 MED ORDER — FENTANYL CITRATE 0.05 MG/ML IJ SOLN
INTRAMUSCULAR | Status: AC
  Filled 2013-09-29: qty 5

## 2013-09-29 MED ORDER — PROPOFOL 10 MG/ML IV BOLUS
INTRAVENOUS | Status: AC
  Filled 2013-09-29: qty 20

## 2013-09-29 SURGICAL SUPPLY — 65 items
BANDAGE ELASTIC 6 VELCRO ST LF (GAUZE/BANDAGES/DRESSINGS) ×2 IMPLANT
BANDAGE ESMARK 6X9 LF (GAUZE/BANDAGES/DRESSINGS) ×1 IMPLANT
BLADE 10 SAFETY STRL DISP (BLADE) ×3 IMPLANT
BLADE SAG 18X100X1.27 (BLADE) ×3 IMPLANT
BLADE SAW SGTL 13X75X1.27 (BLADE) ×3 IMPLANT
BLADE SURG ROTATE 9660 (MISCELLANEOUS) IMPLANT
BNDG CMPR 9X6 STRL LF SNTH (GAUZE/BANDAGES/DRESSINGS) ×1
BNDG CMPR MED 10X6 ELC LF (GAUZE/BANDAGES/DRESSINGS) ×1
BNDG ELASTIC 6X10 VLCR STRL LF (GAUZE/BANDAGES/DRESSINGS) ×3 IMPLANT
BNDG ESMARK 6X9 LF (GAUZE/BANDAGES/DRESSINGS) ×3
BOWL SMART MIX CTS (DISPOSABLE) ×3 IMPLANT
CAPT RP KNEE ×2 IMPLANT
CEMENT HV SMART SET (Cement) ×6 IMPLANT
COVER SURGICAL LIGHT HANDLE (MISCELLANEOUS) ×3 IMPLANT
CUFF TOURNIQUET SINGLE 34IN LL (TOURNIQUET CUFF) IMPLANT
CUFF TOURNIQUET SINGLE 44IN (TOURNIQUET CUFF) IMPLANT
DRAPE EXTREMITY T 121X128X90 (DRAPE) ×3 IMPLANT
DRAPE U-SHAPE 47X51 STRL (DRAPES) ×3 IMPLANT
DURAPREP 26ML APPLICATOR (WOUND CARE) ×6 IMPLANT
ELECT REM PT RETURN 9FT ADLT (ELECTROSURGICAL) ×3
ELECTRODE REM PT RTRN 9FT ADLT (ELECTROSURGICAL) ×1 IMPLANT
EVACUATOR 1/8 PVC DRAIN (DRAIN) ×3 IMPLANT
GAUZE XEROFORM 1X8 LF (GAUZE/BANDAGES/DRESSINGS) ×3 IMPLANT
GLOVE BIO SURGEON STRL SZ7.5 (GLOVE) ×3 IMPLANT
GLOVE BIO SURGEON STRL SZ8.5 (GLOVE) ×3 IMPLANT
GLOVE BIOGEL PI IND STRL 8 (GLOVE) ×1 IMPLANT
GLOVE BIOGEL PI IND STRL 9 (GLOVE) ×1 IMPLANT
GLOVE BIOGEL PI INDICATOR 8 (GLOVE) ×2
GLOVE BIOGEL PI INDICATOR 9 (GLOVE) ×2
GOWN STRL REUS W/ TWL LRG LVL3 (GOWN DISPOSABLE) ×1 IMPLANT
GOWN STRL REUS W/ TWL XL LVL3 (GOWN DISPOSABLE) ×2 IMPLANT
GOWN STRL REUS W/TWL LRG LVL3 (GOWN DISPOSABLE) ×3
GOWN STRL REUS W/TWL XL LVL3 (GOWN DISPOSABLE) ×6
HANDPIECE INTERPULSE COAX TIP (DISPOSABLE) ×3
HOOD PEEL AWAY FACE SHEILD DIS (HOOD) ×6 IMPLANT
IMMOBILIZER KNEE 22 UNIV (SOFTGOODS) ×2 IMPLANT
KIT BASIN OR (CUSTOM PROCEDURE TRAY) ×3 IMPLANT
KIT ROOM TURNOVER OR (KITS) ×3 IMPLANT
MANIFOLD NEPTUNE II (INSTRUMENTS) ×3 IMPLANT
NDL SAFETY ECLIPSE 18X1.5 (NEEDLE) IMPLANT
NDL SPNL 18GX3.5 QUINCKE PK (NEEDLE) IMPLANT
NEEDLE 22X1 1/2 (OR ONLY) (NEEDLE) ×3 IMPLANT
NEEDLE HYPO 18GX1.5 SHARP (NEEDLE)
NEEDLE SPNL 18GX3.5 QUINCKE PK (NEEDLE) IMPLANT
NS IRRIG 1000ML POUR BTL (IV SOLUTION) ×3 IMPLANT
PACK TOTAL JOINT (CUSTOM PROCEDURE TRAY) ×3 IMPLANT
PAD ARMBOARD 7.5X6 YLW CONV (MISCELLANEOUS) ×6 IMPLANT
PADDING CAST COTTON 6X4 STRL (CAST SUPPLIES) ×3 IMPLANT
SET HNDPC FAN SPRY TIP SCT (DISPOSABLE) ×1 IMPLANT
SPONGE GAUZE 4X4 12PLY (GAUZE/BANDAGES/DRESSINGS) ×4 IMPLANT
STAPLER VISISTAT 35W (STAPLE) ×3 IMPLANT
SUCTION FRAZIER TIP 10 FR DISP (SUCTIONS) ×3 IMPLANT
SUT VIC AB 0 CT1 27 (SUTURE) ×3
SUT VIC AB 0 CT1 27XBRD ANBCTR (SUTURE) IMPLANT
SUT VIC AB 0 CTX 36 (SUTURE) ×3
SUT VIC AB 0 CTX36XBRD ANTBCTR (SUTURE) ×1 IMPLANT
SUT VIC AB 1 CTX 36 (SUTURE) ×3
SUT VIC AB 1 CTX36XBRD ANBCTR (SUTURE) ×1 IMPLANT
SUT VIC AB 2-0 CT1 27 (SUTURE) ×3
SUT VIC AB 2-0 CT1 TAPERPNT 27 (SUTURE) ×1 IMPLANT
SYR 30ML LL (SYRINGE) ×3 IMPLANT
SYR 50ML LL SCALE MARK (SYRINGE) ×3 IMPLANT
TOWEL OR 17X24 6PK STRL BLUE (TOWEL DISPOSABLE) ×3 IMPLANT
TOWEL OR 17X26 10 PK STRL BLUE (TOWEL DISPOSABLE) ×3 IMPLANT
WATER STERILE IRR 1000ML POUR (IV SOLUTION) ×4 IMPLANT

## 2013-09-29 NOTE — Anesthesia Procedure Notes (Addendum)
Anesthesia Regional Block:  Adductor canal block  Pre-Anesthetic Checklist: ,, timeout performed, Correct Patient, Correct Site, Correct Laterality, Correct Procedure, Correct Position, site marked, Risks and benefits discussed,  Surgical consent,  Pre-op evaluation,  At surgeon's request and post-op pain management  Laterality: Right  Prep: chloraprep       Needles:  Injection technique: Single-shot  Needle Type: Echogenic Stimulator Needle     Needle Length: 9cm 9 cm Needle Gauge: 21 and 21 G    Additional Needles:  Procedures: ultrasound guided (picture in chart) Adductor canal block Narrative:  Start time: 09/29/2013 11:10 AM End time: 09/29/2013 11:25 AM Injection made incrementally with aspirations every 5 mL.  Performed by: Personally  Anesthesiologist: Lillia Abed MD  Additional Notes: Monitors applied. Patient sedated. Sterile prep and drape,hand hygiene and sterile gloves were used. Relevant anatomy identified.Needle position confirmed.Local anesthetic injected incrementally after negative aspiration. Local anesthetic spread visualized around nerve(s). Vascular puncture avoided. No complications. Image printed for medical record.The patient tolerated the procedure well.    Lillia Abed MD   Procedure Name: LMA Insertion Date/Time: 09/29/2013 11:49 AM Performed by: Izora Gala Pre-anesthesia Checklist: Patient identified, Emergency Drugs available and Patient being monitored Patient Re-evaluated:Patient Re-evaluated prior to inductionOxygen Delivery Method: Circle system utilized Preoxygenation: Pre-oxygenation with 100% oxygen Intubation Type: IV induction LMA: LMA inserted LMA Size: 4.0 Number of attempts: 1 Placement Confirmation: positive ETCO2 and breath sounds checked- equal and bilateral Tube secured with: Tape Dental Injury: Teeth and Oropharynx as per pre-operative assessment

## 2013-09-29 NOTE — Progress Notes (Signed)
Report given to [facility] as primary caregiver

## 2013-09-29 NOTE — Interval H&P Note (Signed)
History and Physical Interval Note:  09/29/2013 11:25 AM  Charlene Tapia  has presented today for surgery, with the diagnosis of OSTEOARTHRITIS RIGHT KNEE  The various methods of treatment have been discussed with the patient and family. After consideration of risks, benefits and other options for treatment, the patient has consented to  Procedure(s): TOTAL KNEE ARTHROPLASTY (Right) as a surgical intervention .  The patient's history has been reviewed, patient examined, no change in status, stable for surgery.  I have reviewed the patient's chart and labs.  Questions were answered to the patient's satisfaction.     Kerin Salen

## 2013-09-29 NOTE — Transfer of Care (Signed)
Immediate Anesthesia Transfer of Care Note  Patient: Charlene Tapia  Procedure(s) Performed: Procedure(s): TOTAL KNEE ARTHROPLASTY (Right)  Patient Location: PACU  Anesthesia Type:General  Level of Consciousness: awake, oriented and patient cooperative  Airway & Oxygen Therapy: Patient Spontanous Breathing and Patient connected to nasal cannula oxygen  Post-op Assessment: Report given to PACU RN, Post -op Vital signs reviewed and stable and Patient moving all extremities  Post vital signs: Reviewed and stable  Complications: No apparent anesthesia complications

## 2013-09-29 NOTE — Plan of Care (Signed)
Problem: Consults Goal: Diagnosis- Total Joint Replacement Primary Total Knee Right     

## 2013-09-29 NOTE — Anesthesia Preprocedure Evaluation (Signed)

## 2013-09-29 NOTE — Progress Notes (Signed)
Orthopedic Tech Progress Note Patient Details:  Charlene Tapia September 01, 1950 [id number]  CPM Right Knee CPM Right Knee: On Right Knee Flexion (Degrees): 60 Right Knee Extension (Degrees): 0 Additional Comments: applied overhead frame   Braulio Bosch 09/29/2013, 4:42 PM

## 2013-09-29 NOTE — Op Note (Signed)
PATIENT ID:      Charlene Tapia  MRN:     [id number] DOB/AGE:    [birth year] / 63 y.o.       OPERATIVE REPORT    DATE OF PROCEDURE:  09/29/2013       PREOPERATIVE DIAGNOSIS:   Osteoarthritis right knee      Estimated body mass index is 45.46 kg/(m^2) as calculated from the following:   Height as of this encounter: 5\' 4"  (1.626 m).   Weight as of this encounter: 120.2 kg (264 lb 15.9 oz).                                                        POSTOPERATIVE DIAGNOSIS:   Osteoarthritis right knee                                                                      PROCEDURE:  Procedure(s): TOTAL KNEE ARTHROPLASTY Using Depuy Sigma RP implants #3R Femur, #4Tibia, 49mm Sigma RP bearing, 35 Patella     SURGEON: Kerin Salen    ASSISTANT:   Kerry Hough. [employer]   (Present and scrubbed throughout the case, critical for assistance with exposure, retraction, instrumentation, and closure.)         ANESTHESIA: GET, ACB, Exparel  DRAINS: 2 medium hemovac in knee   TOURNIQUET TIME: 62GBT   COMPLICATIONS:  None     SPECIMENS: None   INDICATIONS FOR PROCEDURE: The patient has  Osteoarthritis right knee, varus deformities, XR shows bone on bone arthritis. Patient has failed all conservative measures including anti-inflammatory medicines, narcotics, attempts at  exercise and weight loss, cortisone injections and viscosupplementation.  Risks and benefits of surgery have been discussed, questions answered.   DESCRIPTION OF PROCEDURE: The patient identified by armband, received  IV antibiotics, in the holding area at [facility]. Patient taken to the operating room, appropriate anesthetic  monitors were attached, and general endotracheal anesthesia induced with  the patient in supine position, Foley catheter was inserted. Tourniquet  applied high to the operative thigh. Lateral post and foot positioner  applied to the table, the lower extremity was then prepped and draped  in usual sterile  fashion from the ankle to the tourniquet. Time-out procedure was performed. The limb was wrapped with an Esmarch bandage and the tourniquet inflated to 350 mmHg. We began the operation by making the anterior midline incision starting at handbreadth above the patella going over the patella 1 cm medial to and  4 cm distal to the tibial tubercle. Small bleeders in the skin and the  subcutaneous tissue identified and cauterized. Transverse retinaculum was incised and reflected medially and a medial parapatellar arthrotomy was accomplished. the patella was everted and theprepatellar fat pad resected. The superficial medial collateral  ligament was then elevated from anterior to posterior along the proximal  flare of the tibia and anterior half of the menisci resected. The knee was hyperflexed exposing bone on bone arthritis. Peripheral and notch osteophytes as well as the cruciate ligaments were then resected. We continued to  work our  way around posteriorly along the proximal tibia, and externally  rotated the tibia subluxing it out from underneath the femur. A McHale  retractor was placed through the notch and a lateral Hohmann retractor  placed, and we then drilled through the proximal tibia in line with the  axis of the tibia followed by an intramedullary guide rod and 2-degree  posterior slope cutting guide. The tibial cutting guide was pinned into place  allowing resection of 7 mm of bone medially and about 7 mm of bone  laterally because of her deformity. Satisfied with the tibial resection, we then  entered the distal femur 2 mm anterior to the PCL origin with the  intramedullary guide rod and applied the distal femoral cutting guide  set at 41mm, with 5 degrees of valgus. This was pinned along the  epicondylar axis. At this point, the distal femoral cut was accomplished without difficulty. We then sized for a #3R femoral component and pinned the guide in 3 degrees of external rotation.The chamfer  cutting guide was pinned into place. The anterior, posterior, and chamfer cuts were accomplished without difficulty followed by  the box cutting guide and the box cut. We also removed posterior osteophytes from the posterior femoral condyles. At this  time, the knee was brought into full extension. We checked our  extension and flexion gaps and found them symmetric at 84mm.  The patella thickness measured at 25 mm. We set the cutting guide at 15 and removed the posterior 10 mm  of the patella, sized for a 35 button and drilled the lollipop. The knee  was then once again hyperflexed exposing the proximal tibia. We sized for a #4 tibial base plate, applied the smokestack and the conical reamer followed by the the Delta fin keel punch. We then hammered into place the Sigma RP trial femoral component, inserted a 10-mm trial bearing, trial patellar button, and took the knee through range of motion from 0-130 degrees. No thumb pressure was required for patellar  tracking. At this point, all trial components were removed, a double batch of DePuy HV cement with 1500 mg of Zinacef was mixed and applied to all bony metallic mating surfaces except for the posterior condyles of the femur itself. In order, we  hammered into place the tibial tray and removed excess cement, the femoral component and removed excess cement, a 10-mm Sigma RP bearing  was inserted, and the knee brought to full extension with compression.  The patellar button was clamped into place, and excess cement  removed. While the cement cured the wound was irrigated out with normal saline solution pulse lavage, and medium Hemovac drains were placed from an anterolateral  approach. Ligament stability and patellar tracking were checked and found to be excellent. The parapatellar arthrotomy was closed with  running #1 Vicryl suture. The subcutaneous tissue with 0 and 2-0 undyed  Vicryl suture, and the skin with skin staples. A dressing of Xeroform,   4 x 4, dressing sponges, Webril, and Ace wrap applied. The patient  awakened, extubated, and taken to recovery room without difficulty.   Kerin Salen 09/29/2013, 1:39 PM

## 2013-09-29 NOTE — Anesthesia Postprocedure Evaluation (Signed)
Anesthesia Post Note  Patient: Charlene Tapia  Procedure(s) Performed: Procedure(s) (LRB): TOTAL KNEE ARTHROPLASTY (Right)  Anesthesia type: general  Patient location: PACU  Post pain: Pain level controlled  Post assessment: Patient's Cardiovascular Status Stable  Last Vitals:  Filed Vitals:   09/29/13 1634  BP: 155/80  Pulse: 98  Temp: 36.6 C  Resp: 14    Post vital signs: Reviewed and stable  Level of consciousness: sedated  Complications: No apparent anesthesia complications

## 2013-09-30 ENCOUNTER — Encounter (HOSPITAL_COMMUNITY): Payer: Self-pay | Admitting: Orthopedic Surgery

## 2013-09-30 LAB — CBC
HEMATOCRIT: 35.2 % — AB (ref 36.0–46.0)
HEMOGLOBIN: 11.3 g/dL — AB (ref 12.0–15.0)
MCH: 28.9 pg (ref 26.0–34.0)
MCHC: 32.1 g/dL (ref 30.0–36.0)
MCV: 90 fL (ref 78.0–100.0)
Platelets: 216 10*3/uL (ref 150–400)
RBC: 3.91 MIL/uL (ref 3.87–5.11)
RDW: 12.9 % (ref 11.5–15.5)
WBC: 9.5 10*3/uL (ref 4.0–10.5)

## 2013-09-30 NOTE — Progress Notes (Signed)
OT Cancellation Note  Patient Details Name: Charlene Tapia MRN: [id number] DOB: 1951/03/29   Cancelled Treatment:    Reason Eval/Treat Not Completed: OT screened, no needs identified, will sign off. Pt seen today for assessment of ADL performance. Pt reports that she had a L TKA ~6 weeks ago and is now returned for R TKA. Pt reports that she has all DME and AE needs met and no ADL concerns at this time. Acute OT to sign off.   Juluis Rainier [phone] 09/30/2013, 10:39 AM

## 2013-09-30 NOTE — Progress Notes (Signed)
Utilization review completed.  

## 2013-09-30 NOTE — Progress Notes (Signed)
Patient ID: Charlene Tapia, female   DOB: Oct 13, 1950, 63 y.o.   MRN: [id number] PATIENT ID: Charlene Tapia  MRN: [id number]  DOB/AGE:  [birth year] / 63 y.o.  1 Day Post-Op Procedure(s) (LRB): TOTAL KNEE ARTHROPLASTY (Right)    PROGRESS NOTE Subjective: Patient is alert, oriented, nausea last night only, no Vomiting, yes passing gas, no Bowel Movement. Taking PO well this morning. Denies SOB, Chest or Calf Pain. Using Incentive Spirometer, PAS in place. Ambulate has been bed to chair and will be weightbearing as tolerated today, CPM 0-60 without difficulty Patient reports pain as 2 on 0-10 scale  .    Objective: Vital signs in last 24 hours: Filed Vitals:   09/30/13 0000 09/30/13 0253 09/30/13 0400 09/30/13 0628  BP:  158/72  148/69  Pulse:  84  90  Temp:  98.7 F (37.1 C)  100 F (37.8 C)  TempSrc:  Oral  Oral  Resp: 16 16 18 16   Height:      Weight:      SpO2:  95% 97% 96%      Intake/Output from previous day: I/O last 3 completed shifts: In: 2238.4 [P.O.:600; I.V.:1638.4] Out: 180 [Drains:180]   Intake/Output this shift:     LABORATORY DATA:  Recent Labs  09/30/13 0553  WBC 9.5  HGB 11.3*  HCT 35.2*  PLT 216    Examination: Neurologically intact ABD soft Neurovascular intact Sensation intact distally Intact pulses distally Dorsiflexion/Plantar flexion intact Incision: no drainage No cellulitis present Compartment soft} Blood and plasma separated in drain indicating minimal recent drainage, drain pulled without difficulty.  Assessment:   1 Day Post-Op Procedure(s) (LRB): TOTAL KNEE ARTHROPLASTY (Right) ADDITIONAL DIAGNOSIS:  Obesity  Plan: PT/OT WBAT, CPM 5/hrs day until ROM 0-90 degrees, then D/C CPM DVT Prophylaxis:  SCDx72hrs, ASA 325 mg BID x 2 weeks DISCHARGE PLAN: Home, probably tomorrow DISCHARGE NEEDS: HHPT, HHRN, CPM, Walker and 3-in-1 comode seat     Kerin Salen 09/30/2013, 7:03 AM

## 2013-09-30 NOTE — Evaluation (Signed)
Physical Therapy Evaluation Patient Details Name: Charlene Tapia MRN: [id number] DOB: [birth year] Today's Date: 09/30/2013   History of Present Illness  admitted for elective R TKR on 09/29/13; L TKR on 08/16/13  Clinical Impression  Patient presents with new R TKR and recent L TKR (6 weeks ago).  Patient demonstrates good understanding of mobility and was limited today by increased pain.  Patient will benefit from continued PT to increased independence and progress mobility.  Anticipate steady gains.    Follow Up Recommendations Home health PT    Equipment Recommendations  None recommended by PT    Recommendations for Other Services       Precautions / Restrictions Precautions Precautions: Knee Restrictions RLE Weight Bearing: Weight bearing as tolerated      Mobility  Bed Mobility Overal bed mobility: Needs Assistance Bed Mobility: Supine to Sit     Supine to sit: Mod assist     General bed mobility comments: required manual facilitation to raise shoulders off bed; used rail; cueing to not pull on walker to sit up.  Transfers Overall transfer level: Needs assistance Equipment used: Rolling walker (2 wheeled) Transfers: Sit to/from Stand Sit to Stand: Min assist         General transfer comment: manual facilitation to reach upright  Ambulation/Gait Ambulation/Gait assistance: Min guard Ambulation Distance (Feet): 15 Feet Assistive device: Rolling walker (2 wheeled) Gait Pattern/deviations: Step-to pattern        Stairs            Wheelchair Mobility    Modified Rankin (Stroke Patients Only)       Balance Overall balance assessment: Needs assistance Sitting-balance support: No upper extremity supported Sitting balance-Leahy Scale: Fair     Standing balance support: Bilateral upper extremity supported Standing balance-Leahy Scale: Poor                               Pertinent Vitals/Pain 6/10 right knee. Patient was  premedicated and reports pain decreased as she mobilized.    Home Living Family/patient expects to be discharged to:: Private residence Living Arrangements: Spouse/significant other Available Help at Discharge: Family;Friend(s);Available 24 hours/day Type of Home: House Home Access: Ramped entrance     Home Layout: Two level;Able to live on main level with bedroom/bathroom Home Equipment: Kasandra Knudsen - single point;Walker - 4 wheels      Prior Function Level of Independence: Independent         Comments: had just gotten to the point of ambulating without assistive device from recent TKR.     Hand Dominance   Dominant Hand: Right    Extremity/Trunk Assessment   Upper Extremity Assessment: Overall WFL for tasks assessed           Lower Extremity Assessment: RLE deficits/detail         Communication   Communication: No difficulties  Cognition Arousal/Alertness: Awake/alert Behavior During Therapy: WFL for tasks assessed/performed Overall Cognitive Status: Within Functional Limits for tasks assessed                      General Comments      Exercises Total Joint Exercises Ankle Circles/Pumps: AROM;Both;10 reps;Seated Quad Sets: AROM;Both;10 reps;Seated Gluteal Sets: AROM;Both;10 reps;Seated      Assessment/Plan    PT Assessment Patient needs continued PT services  PT Diagnosis Difficulty walking   PT Problem List Decreased strength;Decreased range of motion;Decreased activity tolerance;Decreased balance;Decreased mobility  PT  Treatment Interventions DME instruction;Gait training;Stair training;Functional mobility training;Therapeutic activities;Therapeutic exercise;Patient/family education   PT Goals (Current goals can be found in the Care Plan section) Acute Rehab PT Goals Patient Stated Goal: decrease the pain - I am really hurting today PT Goal Formulation: With patient Time For Goal Achievement: 10/04/13 Potential to Achieve Goals: Good     Frequency 7X/week   Barriers to discharge        Co-evaluation               End of Session Equipment Utilized During Treatment: Gait belt Activity Tolerance: Patient tolerated treatment well Patient left: in chair;with call bell/phone within reach           Time: 1138-1159 PT Time Calculation (min): 21 min   Charges:   PT Evaluation $Initial PT Evaluation Tier I: 1 Procedure PT Treatments $Gait Training: 8-22 mins   PT G CodesShanna Cisco, [place] [phone] 09/30/2013, 12:15 PM

## 2013-09-30 NOTE — Care Management Note (Signed)
CARE MANAGEMENT NOTE 09/30/2013  Patient:  Charlene Tapia, Charlene Tapia   Account Number:  [id number]  Date Initiated:  09/30/2013  Documentation initiated by:  Ricki Miller  Subjective/Objective Assessment:   63 yr old female s/p right total knee arthroplasty.     Action/Plan:   Case manager spoke with patient concerning home health and DME needs. Patient states she has walker, 3in1,tub bench and hip kit from previous surgery. Patient has family support at discharge.patient preoperatively setup with [facility] .   Anticipated DC Date:  10/01/2013   Anticipated DC Plan:  [place]  CM consult      [facility] Choice  HOME HEALTH   Choice offered to / List presented to:  C-1 Patient           Status of service:  Completed, signed off Medicare Important Message given?   (If response is "NO", the following Medicare IM given date fields will be blank) Date Medicare IM given:   Date Additional Medicare IM given:    Discharge Disposition:  [place]  Per UR Regulation:    If discussed at Long Length of Stay Meetings, dates discussed:    Comments:

## 2013-09-30 NOTE — Progress Notes (Signed)
Physical Therapy Treatment Patient Details Name: Charlene Tapia MRN: [id number] DOB: 12/07/1950 Today's Date: 09/30/2013    History of Present Illness admitted for elective R TKR on 09/29/13; L TKR on 08/16/13    PT Comments    Patient slowly progressing with mobility.  Patient with weak quads and has the sensation of her knee "buckling" when ambulating.  Patient with limited ambulation which appears to be extremely fatiguing to her.  Patient will need more PT before ready for d/c.   Follow Up Recommendations  Home health PT     Equipment Recommendations  None recommended by PT    Recommendations for Other Services       Precautions / Restrictions Precautions Precautions: Knee Restrictions RLE Weight Bearing: Weight bearing as tolerated    Mobility  Bed Mobility Overal bed mobility: Needs Assistance Bed Mobility: Sit to Supine     Supine to sit: Mod assist Sit to supine: Mod assist   General bed mobility comments: assit for RLE  Transfers Overall transfer level: Needs assistance Equipment used: Rolling walker (2 wheeled) Transfers: Sit to/from Stand Sit to Stand: Min guard         General transfer comment: manual facilitation to reach upright  Ambulation/Gait Ambulation/Gait assistance: Min guard Ambulation Distance (Feet): 50 Feet Assistive device: Rolling walker (2 wheeled) Gait Pattern/deviations: Step-to pattern;Antalgic;Decreased stance time - right     General Gait Details: patient reports a "buckling" feeling in R knee   Stairs            Wheelchair Mobility    Modified Rankin (Stroke Patients Only)       Balance Overall balance assessment: Needs assistance Sitting-balance support: No upper extremity supported Sitting balance-Leahy Scale: Fair     Standing balance support: Bilateral upper extremity supported Standing balance-Leahy Scale: Poor                      Cognition Arousal/Alertness: Awake/alert Behavior  During Therapy: WFL for tasks assessed/performed Overall Cognitive Status: Within Functional Limits for tasks assessed                      Exercises Total Joint Exercises Ankle Circles/Pumps: AROM;Both;10 reps;Supine Quad Sets: AROM;Both;10 reps;Supine Gluteal Sets: AROM;Both;10 reps;Supine Short Arc Quad: AAROM;10 reps;Supine;Right Heel Slides: AAROM;Right;10 reps;Supine Hip ABduction/ADduction: AAROM;Right;10 reps;Supine Straight Leg Raises: AAROM;Right;10 reps;Supine Long Arc Quad: AAROM;Right;10 reps;Seated Knee Flexion: AAROM;Right;Seated Goniometric ROM: 75 degrees flexion    General Comments        Pertinent Vitals/Pain 7/10 Right knee in CPM - nursing notified patient request for pain meds.    Home Living Family/patient expects to be discharged to:: Private residence Living Arrangements: Spouse/significant other Available Help at Discharge: Family;Friend(s);Available 24 hours/day Type of Home: House Home Access: Ramped entrance   Home Layout: Two level;Able to live on main level with bedroom/bathroom Home Equipment: Kasandra Knudsen - single point;Walker - 4 wheels      Prior Function Level of Independence: Independent      Comments: had just gotten to the point of ambulating without assistive device from recent TKR.   PT Goals (current goals can now be found in the care plan section) Acute Rehab PT Goals Patient Stated Goal: decrease the pain - I am really hurting today PT Goal Formulation: With patient Time For Goal Achievement: 10/04/13 Potential to Achieve Goals: Good Progress towards PT goals: Progressing toward goals    Frequency  7X/week    PT Plan Current plan remains appropriate  Co-evaluation             End of Session Equipment Utilized During Treatment: Gait belt Activity Tolerance: Patient tolerated treatment well Patient left: in bed;with call bell/phone within reach     Time: 1400-1430 PT Time Calculation (min): 30  min  Charges:  $Gait Training: 8-22 mins $Therapeutic Exercise: 8-22 mins                    G CodesAdolm Joseph McMullin, [place] [phone] 09/30/2013, 2:36 PM

## 2013-10-01 LAB — CBC
HCT: 33.6 % — ABNORMAL LOW (ref 36.0–46.0)
Hemoglobin: 10.9 g/dL — ABNORMAL LOW (ref 12.0–15.0)
MCH: 29 pg (ref 26.0–34.0)
MCHC: 32.4 g/dL (ref 30.0–36.0)
MCV: 89.4 fL (ref 78.0–100.0)
PLATELETS: 207 10*3/uL (ref 150–400)
RBC: 3.76 MIL/uL — AB (ref 3.87–5.11)
RDW: 12.9 % (ref 11.5–15.5)
WBC: 9.9 10*3/uL (ref 4.0–10.5)

## 2013-10-01 NOTE — Progress Notes (Signed)
CSW (Clinical Social Worker) aware of consult. At this time, PT is recommending HH services. Pt has no social work needs. Please reconsult should needs arise.  Drucilla Cumber, LCSWA [phone]  

## 2013-10-01 NOTE — Progress Notes (Signed)
PATIENT ID: Charlene Tapia  MRN: [id number]  DOB/AGE:  Nov 22, 1950 / 63 y.o.  2 Days Post-Op Procedure(s) (LRB): TOTAL KNEE ARTHROPLASTY (Right)    PROGRESS NOTE Subjective: Patient is alert, oriented, no Nausea, no Vomiting, yes passing gas, no Bowel Movement. Taking PO well. Denies SOB, Chest or Calf Pain. Using Incentive Spirometer, PAS in place. Ambulate WBAT, CPM 0-60 Patient reports pain as 4 on 0-10 scale  .    Objective: Vital signs in last 24 hours: Filed Vitals:   09/30/13 1432 09/30/13 1552 09/30/13 2302 10/01/13 0437  BP:  157/69 126/60 146/67  Pulse:  90 94 94  Temp:  100.2 F (37.9 C) 98.5 F (36.9 C) 100.1 F (37.8 C)  TempSrc:  Oral Oral Oral  Resp:  16 16 16   Height:      Weight:      SpO2: 91% 100% 99% 93%      Intake/Output from previous day: I/O last 3 completed shifts: In: 1358.4 [P.O.:720; I.V.:638.4] Out: 50 [Drains:50]   Intake/Output this shift:     LABORATORY DATA:  Recent Labs  09/30/13 0553 10/01/13 0442  WBC 9.5 9.9  HGB 11.3* 10.9*  HCT 35.2* 33.6*  PLT 216 207    Examination: Neurologically intact Neurovascular intact Sensation intact distally Intact pulses distally Dorsiflexion/Plantar flexion intact Incision: dressing C/D/I No cellulitis present Compartment soft}  Assessment:   2 Days Post-Op Procedure(s) (LRB): TOTAL KNEE ARTHROPLASTY (Right) ADDITIONAL DIAGNOSIS:  obesity  Plan: PT/OT WBAT, CPM 5/hrs day until ROM 0-90 degrees, then D/C CPM DVT Prophylaxis:  SCDx72hrs, ASA 325 mg BID x 2 weeks DISCHARGE PLAN: Home likely tomorrow DISCHARGE NEEDS: HHPT, HHRN, CPM, Walker and 3-in-1 comode seat     Charlene Tapia 10/01/2013, 7:22 AM

## 2013-10-01 NOTE — Progress Notes (Signed)
Agree with SPT.    Shane Badeaux, PT [phone]  

## 2013-10-01 NOTE — Progress Notes (Signed)
Came to see patient at bedside on behalf of Link to [employer] program for [employer] employees/dependents with [employer]. She declines having any Link to Wellness needs or follow up from [employer]. Marthenia Rolling, MSN- [facility] [phone]

## 2013-10-01 NOTE — Progress Notes (Signed)
Patient requested soft diet tray for dinner on 09/30/13. Day shift nurse requested the tray from dietary at 6:30 pm. At shift change the patients tray had not arrived to the unit. Night shift nurse called dietary again to request soft diet tray at 7:45 pm. Dietary never brought patients tray to the room. Patient requested a regular diet for breakfast. Night shift nurse advanced patients diet and called dietary for patients meal order at 6:20 am. Night shift nurse also expressed to dietary supervisor that patient sat all night with no meal tray even though the meal was ordered twice before closing at 8:00 pm. Patient was unsettled that she did not receive tray last night. Night shift nurse took the patient some chicken noodle soup and milk to the patient to aide with hunger.

## 2013-10-01 NOTE — Progress Notes (Signed)
Orthopedic Tech Progress Note Patient Details:  Charlene Tapia 22-Sep-1950 [id number]  Patient ID: Charlene Tapia, female   DOB: 1951/02/28, 63 y.o.   MRN: [id number] Placed pt's rle on cpm @ 0-60 degrees @ 2015  Peni Rupard 10/01/2013, 8:16 PM

## 2013-10-01 NOTE — Progress Notes (Signed)
Physical Therapy Treatment Patient Details Name: Charlene Tapia MRN: [id number] DOB: 04-06-1951 Today's Date: 10/01/2013    History of Present Illness admitted for elective R TKR on 09/29/13; L TKR on 08/16/13    PT Comments    Pt was able to ambulate 400 ft this morning with no rest breaks but required min guard due to pt fear that R knee might buckle.  Pt was also able to perform sit<>stand transfers with min guard, requiring no VC for form during transfer.  Will continue to follow.   Follow Up Recommendations  Home health PT     Equipment Recommendations  None recommended by PT    Recommendations for Other Services       Precautions / Restrictions Precautions Precautions: Knee Precaution Comments: Pt states that she is familiar with all precautions and exercises from previous TKA. Restrictions Weight Bearing Restrictions: Yes RLE Weight Bearing: Weight bearing as tolerated    Mobility  Bed Mobility               General bed mobility comments: Pt OOB and seated in chair upon PT arrival  Transfers Overall transfer level: Needs assistance Equipment used: Rolling walker (2 wheeled) Transfers: Sit to/from Stand Sit to Stand: Min guard         General transfer comment: Pt able to sit/stand with proper form with no VC, min guard provided due to pt fear that R knee may buckle.  Ambulation/Gait Ambulation/Gait assistance: Min guard Ambulation Distance (Feet): 400 Feet Assistive device: Rolling walker (2 wheeled) Gait Pattern/deviations: Step-to pattern;Decreased stance time - right;Decreased step length - left;Decreased weight shift to right Gait velocity: decreased Gait velocity interpretation: Below normal speed for age/gender General Gait Details: min guard provided due to pt fear that R knee may buckle, pt completed 400 ft without incident   Stairs            Wheelchair Mobility    Modified Rankin (Stroke Patients Only)       Balance  Overall balance assessment: Needs assistance         Standing balance support: Bilateral upper extremity supported Standing balance-Leahy Scale: Poor Standing balance comment: Pt required B UE support on chair arms or RW at all times in standing to maintain balance.                    Cognition Arousal/Alertness: Awake/alert Behavior During Therapy: WFL for tasks assessed/performed Overall Cognitive Status: Within Functional Limits for tasks assessed                      Exercises Total Joint Exercises Ankle Circles/Pumps: AROM;Both;10 reps;Seated Quad Sets: AROM;Right;Seated;5 reps Gluteal Sets: AROM;5 reps;Both;Seated Long Arc Quad: AAROM;Right;Seated;5 reps Knee Flexion: AAROM;Right;Seated;5 reps Goniometric ROM: 85    General Comments        Pertinent Vitals/Pain Pt reports 4/10 pain and feels that it is currently well-managed.    Home Living                      Prior Function            PT Goals (current goals can now be found in the care plan section) Acute Rehab PT Goals Patient Stated Goal: none stated PT Goal Formulation: With patient Time For Goal Achievement: 10/04/13 Potential to Achieve Goals: Good Progress towards PT goals: Progressing toward goals    Frequency  7X/week    PT Plan Current plan remains appropriate  Co-evaluation             End of Session Equipment Utilized During Treatment: Gait belt Activity Tolerance: Patient tolerated treatment well Patient left: in chair;with call bell/phone within reach     Time: 0804-0832 PT Time Calculation (min): 28 min  Charges:                       G Codes:      Okie Jansson, SPT 10/01/2013, 9:42 AM

## 2013-10-01 NOTE — Progress Notes (Signed)
Agree with SPT.    Claris Pech, PT [phone]  

## 2013-10-01 NOTE — Progress Notes (Signed)
Physical Therapy Treatment Patient Details Name: Charlene Tapia MRN: [id number] DOB: [birth year] Today's Date: 10/01/2013    History of Present Illness admitted for elective R TKR on 09/29/13; L TKR on 08/16/13    PT Comments    Pt ambulated 450 ft with RW and min guard with no rest breaks.  She performed sit/stand transfers with bil. UE support on chair arms with supervision and required no VC for proper form and safety.  She was also able to increase her knee flexion to 90 degrees with AAROM in sitting.  Will continue to follow.   Follow Up Recommendations  Home health PT     Equipment Recommendations  None recommended by PT    Recommendations for Other Services       Precautions / Restrictions Precautions Precautions: Knee Restrictions Weight Bearing Restrictions: Yes RLE Weight Bearing: Weight bearing as tolerated    Mobility  Bed Mobility               General bed mobility comments: Pt OOB and seated in chair upon PT arrival  Transfers Overall transfer level: Needs assistance Equipment used: Rolling walker (2 wheeled) Transfers: Sit to/from Stand Sit to Stand: Supervision         General transfer comment: Pt able to sit/stand with bil. UE support on chair arms with proper form and no VC.  Supervision provided for safety.  Ambulation/Gait Ambulation/Gait assistance: Min guard Ambulation Distance (Feet): 450 Feet Assistive device: Rolling walker (2 wheeled) Gait Pattern/deviations: Step-to pattern;Decreased stance time - right;Decreased step length - left;Decreased weight shift to right Gait velocity: decreased Gait velocity interpretation: Below normal speed for age/gender General Gait Details: Min guard for safety with R LE weakness.  Pt required no rest breaks for 450 ft.   Stairs            Wheelchair Mobility    Modified Rankin (Stroke Patients Only)       Balance Overall balance assessment: Needs assistance         Standing  balance support: Bilateral upper extremity supported Standing balance-Leahy Scale: Poor Standing balance comment: Pt maintains B UE support at all times in standing.                    Cognition Arousal/Alertness: Awake/alert Behavior During Therapy: WFL for tasks assessed/performed Overall Cognitive Status: Within Functional Limits for tasks assessed                      Exercises Total Joint Exercises Long Arc Quad: AAROM;Right;Seated;5 reps Knee Flexion: AAROM;Right;Seated;5 reps Goniometric ROM: 90    General Comments        Pertinent Vitals/Pain Pt reports 3-4/10 pain.  Pt premedicated.    Home Living                      Prior Function            PT Goals (current goals can now be found in the care plan section) Acute Rehab PT Goals Patient Stated Goal: go home tomorrow PT Goal Formulation: With patient Time For Goal Achievement: 10/04/13 Potential to Achieve Goals: Good Progress towards PT goals: Progressing toward goals    Frequency  7X/week    PT Plan Current plan remains appropriate    Co-evaluation             End of Session Equipment Utilized During Treatment: Gait belt Activity Tolerance: Patient tolerated treatment well Patient left:  in chair;with call bell/phone within reach     Time: 1309-1341 PT Time Calculation (min): 32 min  Charges:                       G Codes:      Leanora Murin, SPT 10/01/2013, 2:23 PM

## 2013-10-02 LAB — CBC
HEMATOCRIT: 31.6 % — AB (ref 36.0–46.0)
Hemoglobin: 10 g/dL — ABNORMAL LOW (ref 12.0–15.0)
MCH: 28.5 pg (ref 26.0–34.0)
MCHC: 31.6 g/dL (ref 30.0–36.0)
MCV: 90 fL (ref 78.0–100.0)
Platelets: 196 10*3/uL (ref 150–400)
RBC: 3.51 MIL/uL — ABNORMAL LOW (ref 3.87–5.11)
RDW: 12.9 % (ref 11.5–15.5)
WBC: 8.5 10*3/uL (ref 4.0–10.5)

## 2013-10-02 NOTE — Progress Notes (Signed)
PATIENT ID: Charlene Tapia  MRN: [id number]  DOB/AGE:  1950-08-17 / 63 y.o.  3 Days Post-Op Procedure(s) (LRB): TOTAL KNEE ARTHROPLASTY (Right)    PROGRESS NOTE Subjective: Patient is alert, oriented, no Nausea, no Vomiting, yes passing gas, no Bowel Movement. Taking PO well. Denies SOB, Chest or Calf Pain. Using Incentive Spirometer, PAS in place. Ambulate WBAT, CPM 0-60 Patient reports pain as 2 on 0-10 scale  .    Objective: Vital signs in last 24 hours: Filed Vitals:   10/01/13 1400 10/01/13 2000 10/01/13 2011 10/01/13 2122  BP: 146/58  170/76 134/48  Pulse: 92  120 89  Temp: 99.3 F (37.4 C)  99.4 F (37.4 C) 98.9 F (37.2 C)  TempSrc:   Oral Oral  Resp: 16 18 18 18   Height:      Weight:      SpO2: 94% 94% 94% 97%      Intake/Output from previous day: I/O last 3 completed shifts: In: 1320 [P.O.:1320] Out: -    Intake/Output this shift:     LABORATORY DATA:  Recent Labs  10/01/13 0442 10/02/13 0605  WBC 9.9 8.5  HGB 10.9* 10.0*  HCT 33.6* 31.6*  PLT 207 196    Examination: Neurologically intact Neurovascular intact Sensation intact distally Intact pulses distally Dorsiflexion/Plantar flexion intact Incision: dressing C/D/I No cellulitis present Compartment soft}  Assessment:   3 Days Post-Op Procedure(s) (LRB): TOTAL KNEE ARTHROPLASTY (Right) ADDITIONAL DIAGNOSIS:  obesity  Plan: PT/OT WBAT , D/C CPM as pt has 90 degrees of flexion DVT Prophylaxis:  SCDx72hrs, ASA 325 mg BID x 2 weeks DISCHARGE PLAN: Home DISCHARGE NEEDS: HHPT, [place], Walker and 3-in-1 comode seat     Leighton Parody 10/02/2013, 8:00 AM

## 2013-10-02 NOTE — Progress Notes (Signed)
Physical Therapy Treatment Patient Details Name: SHATEKA PETREA MRN: [id number] DOB: 02/05/1951 Today's Date: 2013-10-06    History of Present Illness admitted for elective R TKR on 09/29/13; L TKR on 08/16/13    PT Comments    Pt progressing well. Safe to discharge home today at current level.  Follow Up Recommendations  Home health PT     Equipment Recommendations  None recommended by PT    Precautions / Restrictions Precautions Precautions: Knee Precaution Comments: Pt states that she is familiar with all precautions and exercises from previous TKA. Restrictions RLE Weight Bearing: Weight bearing as tolerated    Mobility  Bed Mobility               General bed mobility comments: pt OOB in bathroom dressing on arrival and to recliner after session  Transfers Overall transfer level: Modified independent Equipment used: Standard walker Transfers: Sit to/from Stand Sit to Stand: Modified independent (Device/Increase time)         General transfer comment: no cues or assistance needed  Ambulation/Gait Ambulation/Gait assistance: Modified independent (Device/Increase time) Ambulation Distance (Feet): 600 Feet Assistive device: Rolling walker (2 wheeled) Gait Pattern/deviations: Step-through pattern;Antalgic Gait velocity: decreased Gait velocity interpretation: Below normal speed for age/gender          Cognition Arousal/Alertness: Awake/alert Behavior During Therapy: WFL for tasks assessed/performed Overall Cognitive Status: Within Functional Limits for tasks assessed                      Exercises Total Joint Exercises Quad Sets: AROM;Strengthening;Right;10 reps;Seated Long Arc Quad: AROM;AAROM;Strengthening;Right;10 reps;Seated Knee Flexion: AROM;Strengthening;Right;10 reps;Seated Goniometric ROM: seated right knee: AROM 75 degrees flexion, AAROM (pt assisted) 85 degrees flexion, AAROM (PTA assisted) 88 degrees flexion. reclined: AROM  knee extension -10 from neutral        PT Goals (current goals can now be found in the care plan section) Acute Rehab PT Goals Patient Stated Goal: go home today PT Goal Formulation: With patient Time For Goal Achievement: 10/04/13 Potential to Achieve Goals: Good Progress towards PT goals: Progressing toward goals    Frequency  7X/week    PT Plan Current plan remains appropriate    End of Session Equipment Utilized During Treatment: Gait belt Activity Tolerance: Patient tolerated treatment well Patient left: in chair;with call bell/phone within reach     Time: 1040-1118 PT Time Calculation (min): 38 min  Charges:  $Gait Training: 23-37 mins $Therapeutic Exercise: 8-22 mins                    G Codes:      Willow Ora 06-Oct-2013, 1:28 PM  Willow Ora, PTA Office- [phone]

## 2013-10-02 NOTE — Discharge Summary (Signed)
Patient ID: Charlene Tapia MRN: [id number] DOB/AGE: 20-Jan-1951 63 y.o.  Admit date: 09/29/2013 Discharge date: 10/02/2013  Admission Diagnoses:  Active Problems:   Arthritis of right knee   Discharge Diagnoses:  Same  Past Medical History  Diagnosis Date  . Allergy   . High cholesterol   . Osteoarthritis     "knees; thumbs" (10/01/2013)    Surgeries: Procedure(s): TOTAL KNEE ARTHROPLASTY on 09/29/2013   Consultants:    Discharged Condition: Improved  Hospital Course: ADELAYDE MINNEY is an 63 y.o. female who was admitted 09/29/2013 for operative treatment of<principal problem not specified>. Patient has severe unremitting pain that affects sleep, daily activities, and work/hobbies. After pre-op clearance the patient was taken to the operating room on 09/29/2013 and underwent  Procedure(s): TOTAL KNEE ARTHROPLASTY.    Patient was given perioperative antibiotics: Anti-infectives   Start     Dose/Rate Route Frequency Ordered Stop   09/29/13 0600  ceFAZolin (ANCEF) 3 g in dextrose 5 % 50 mL IVPB     3 g 160 mL/hr over 30 Minutes Intravenous On call to O.R. 09/28/13 1420 09/29/13 1204       Patient was given sequential compression devices, early ambulation, and chemoprophylaxis to prevent DVT.  Patient benefited maximally from hospital stay and there were no complications.    Recent vital signs: Patient Vitals for the past 24 hrs:  BP Temp Temp src Pulse Resp SpO2  10/01/13 2122 134/48 mmHg 98.9 F (37.2 C) Oral 89 18 97 %  10/01/13 2011 170/76 mmHg 99.4 F (37.4 C) Oral 120 18 94 %  10/01/13 2000 - - - - 18 94 %  10/01/13 1400 146/58 mmHg 99.3 F (37.4 C) - 92 16 94 %     Recent laboratory studies:  Recent Labs  10/01/13 0442 10/02/13 0605  WBC 9.9 8.5  HGB 10.9* 10.0*  HCT 33.6* 31.6*  PLT 207 196     Discharge Medications:     Medication List         acetaminophen 325 MG tablet  Commonly known as:  TYLENOL  Take 650 mg by mouth every 6 (six) hours  as needed for mild pain or moderate pain.     ARTIFICIAL TEARS OP  Apply 1 drop to eye 2 (two) times daily as needed (for dry eyes. [employer] brand).     aspirin EC 325 MG tablet  Take 1 tablet (325 mg total) by mouth 2 (two) times daily.     fluticasone 50 MCG/ACT nasal spray  Commonly known as:  FLONASE  Place 2 sprays into both nostrils daily as needed for allergies or rhinitis.     methocarbamol 500 MG tablet  Commonly known as:  ROBAXIN  Take 1 tablet (500 mg total) by mouth 2 (two) times daily as needed for muscle spasms.     oxyCODONE-acetaminophen 5-325 MG per tablet  Commonly known as:  ROXICET  Take 1 tablet by mouth every 4 (four) hours as needed.     traMADol 50 MG tablet  Commonly known as:  ULTRAM  Take 1 tablet (50 mg total) by mouth at bedtime as needed for pain.        Diagnostic Studies: No results found.  Disposition: 06-Home-Health Care Svc      Discharge Instructions   Call MD / Call 911    Complete by:  As directed   If you experience chest pain or shortness of breath, CALL 911 and be transported to the hospital emergency room.  If you develope a fever above 101 F, pus (white drainage) or increased drainage or redness at the wound, or calf pain, call your surgeon's office.     Change dressing    Complete by:  As directed   Change dressing on 5, then change the dressing daily with sterile 4 x 4 inch gauze dressing and apply TED hose.  You may clean the incision with alcohol prior to redressing.     Constipation Prevention    Complete by:  As directed   Drink plenty of fluids.  Prune juice may be helpful.  You may use a stool softener, such as Colace (over the counter) 100 mg twice a day.  Use MiraLax (over the counter) for constipation as needed.     Diet - low sodium heart healthy    Complete by:  As directed      Discharge instructions    Complete by:  As directed   Follow up in office in 2 weeks.     Driving restrictions    Complete by:  As  directed   No driving for 2 weeks     Increase activity slowly as tolerated    Complete by:  As directed      Patient may shower    Complete by:  As directed   You may shower without a dressing once there is no drainage.  Do not wash over the wound.  If drainage remains, cover wound with plastic wrap and then shower.           Follow-up Information   Follow up with Kerin Salen, MD In 2 weeks.   Specialty:  Orthopedic Surgery   Contact information:   [place] [address] [phone]       Follow up with [facility]. (Someone from [facility] will contact you concerning start date and time for physical  therapy.)    Contact information:   [address] [place] [address] [phone]       Follow up with Kerin Salen, MD In 2 weeks.   Specialty:  Orthopedic Surgery   Contact information:   [place] [address] [phone]        Signed: Leighton Parody 10/02/2013, 8:04 AM

## 2013-11-12 NOTE — OR Nursing (Signed)
Addendum to scope page 

## 2014-02-02 ENCOUNTER — Other Ambulatory Visit (HOSPITAL_COMMUNITY): Payer: Self-pay | Admitting: Orthopedic Surgery

## 2014-02-02 DIAGNOSIS — M542 Cervicalgia: Secondary | ICD-10-CM

## 2014-02-17 ENCOUNTER — Inpatient Hospital Stay (HOSPITAL_COMMUNITY)
Admission: RE | Admit: 2014-02-17 | Discharge: 2014-02-17 | Disposition: A | Discharge status: Home / Self Care | Payer: 59 | Source: Ambulatory Visit | Attending: Orthopedic Surgery | Admitting: Orthopedic Surgery

## 2014-02-17 ENCOUNTER — Other Ambulatory Visit (HOSPITAL_COMMUNITY): Payer: Self-pay | Admitting: Orthopedic Surgery

## 2014-02-17 ENCOUNTER — Ambulatory Visit (HOSPITAL_COMMUNITY)
Admission: RE | Admit: 2014-02-17 | Discharge: 2014-02-17 | Disposition: A | Discharge status: Home / Self Care | Payer: 59 | Source: Ambulatory Visit | Attending: Orthopedic Surgery | Admitting: Orthopedic Surgery

## 2014-02-17 DIAGNOSIS — M542 Cervicalgia: Secondary | ICD-10-CM

## 2014-06-01 IMAGING — CR DG CHEST 2V
2 series · 2 of 2 positions shown · non-contrast
Comparison: None.

CLINICAL DATA: Cough, congestion and shortness of breath.

EXAM:
CHEST  2 VIEW

[PA]
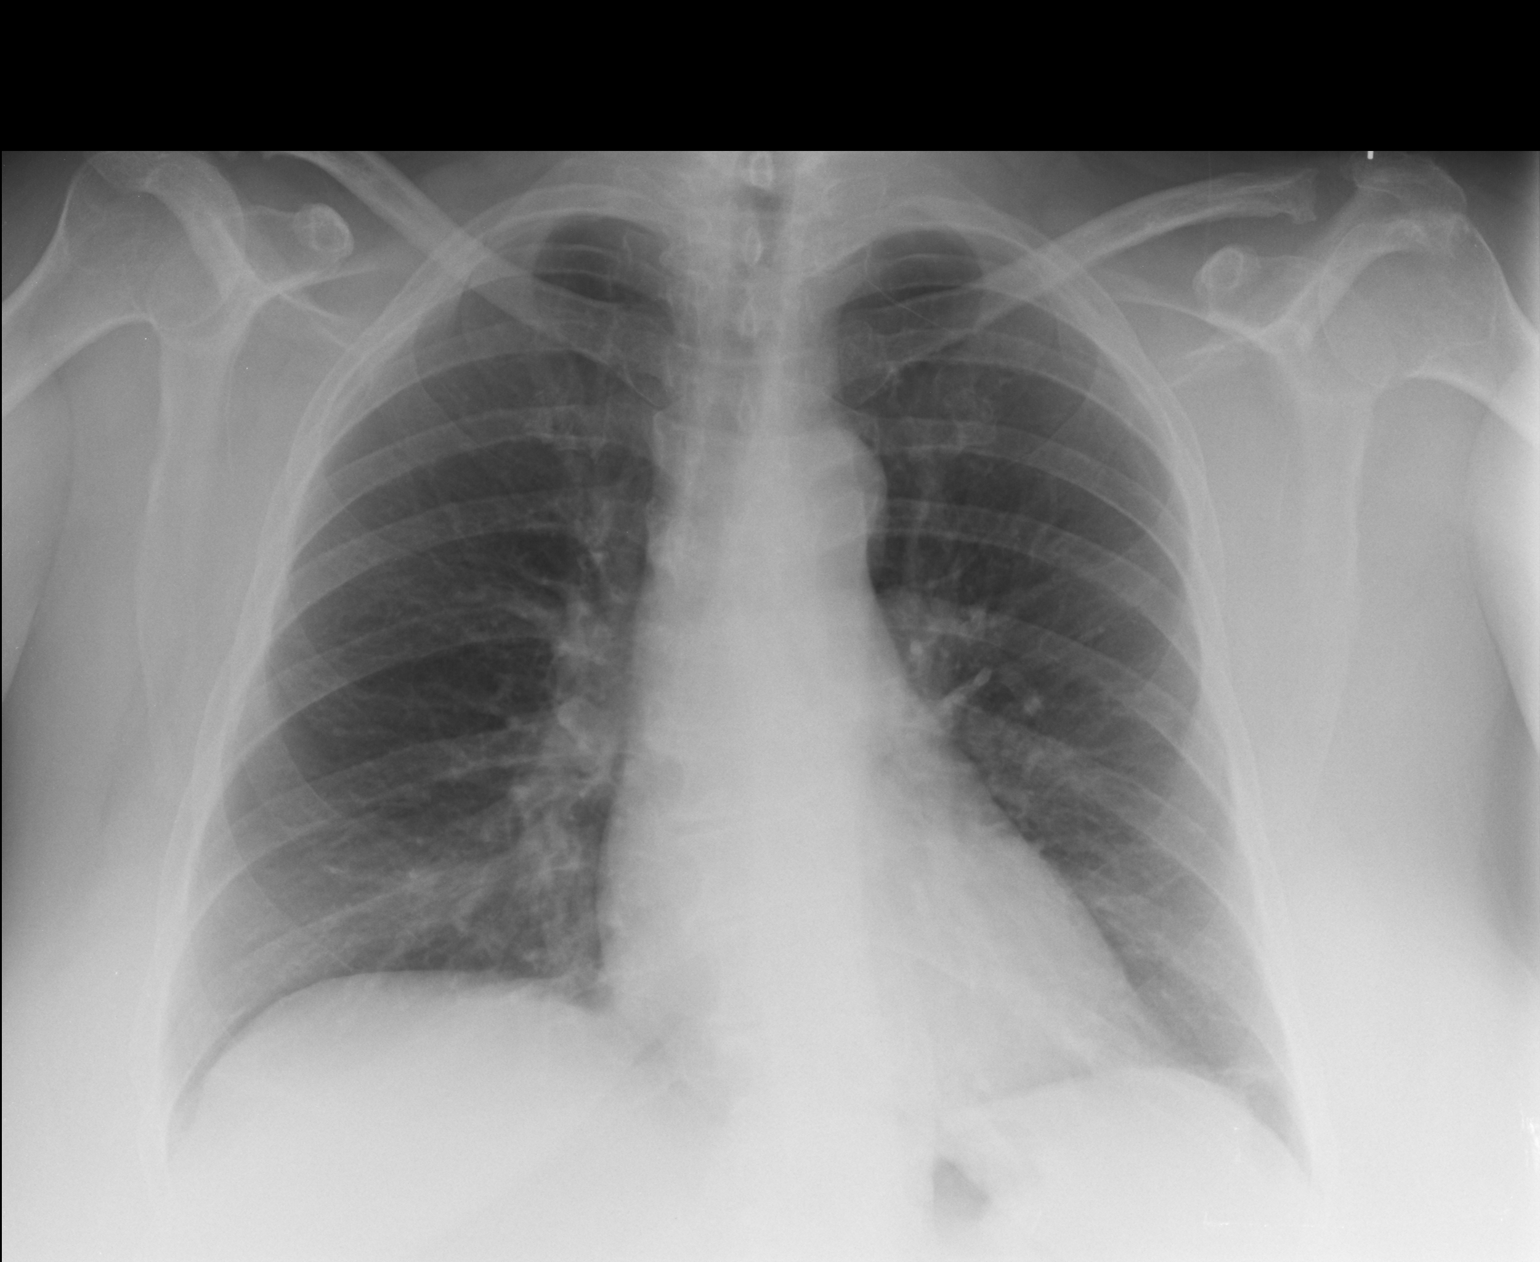

[lateral]
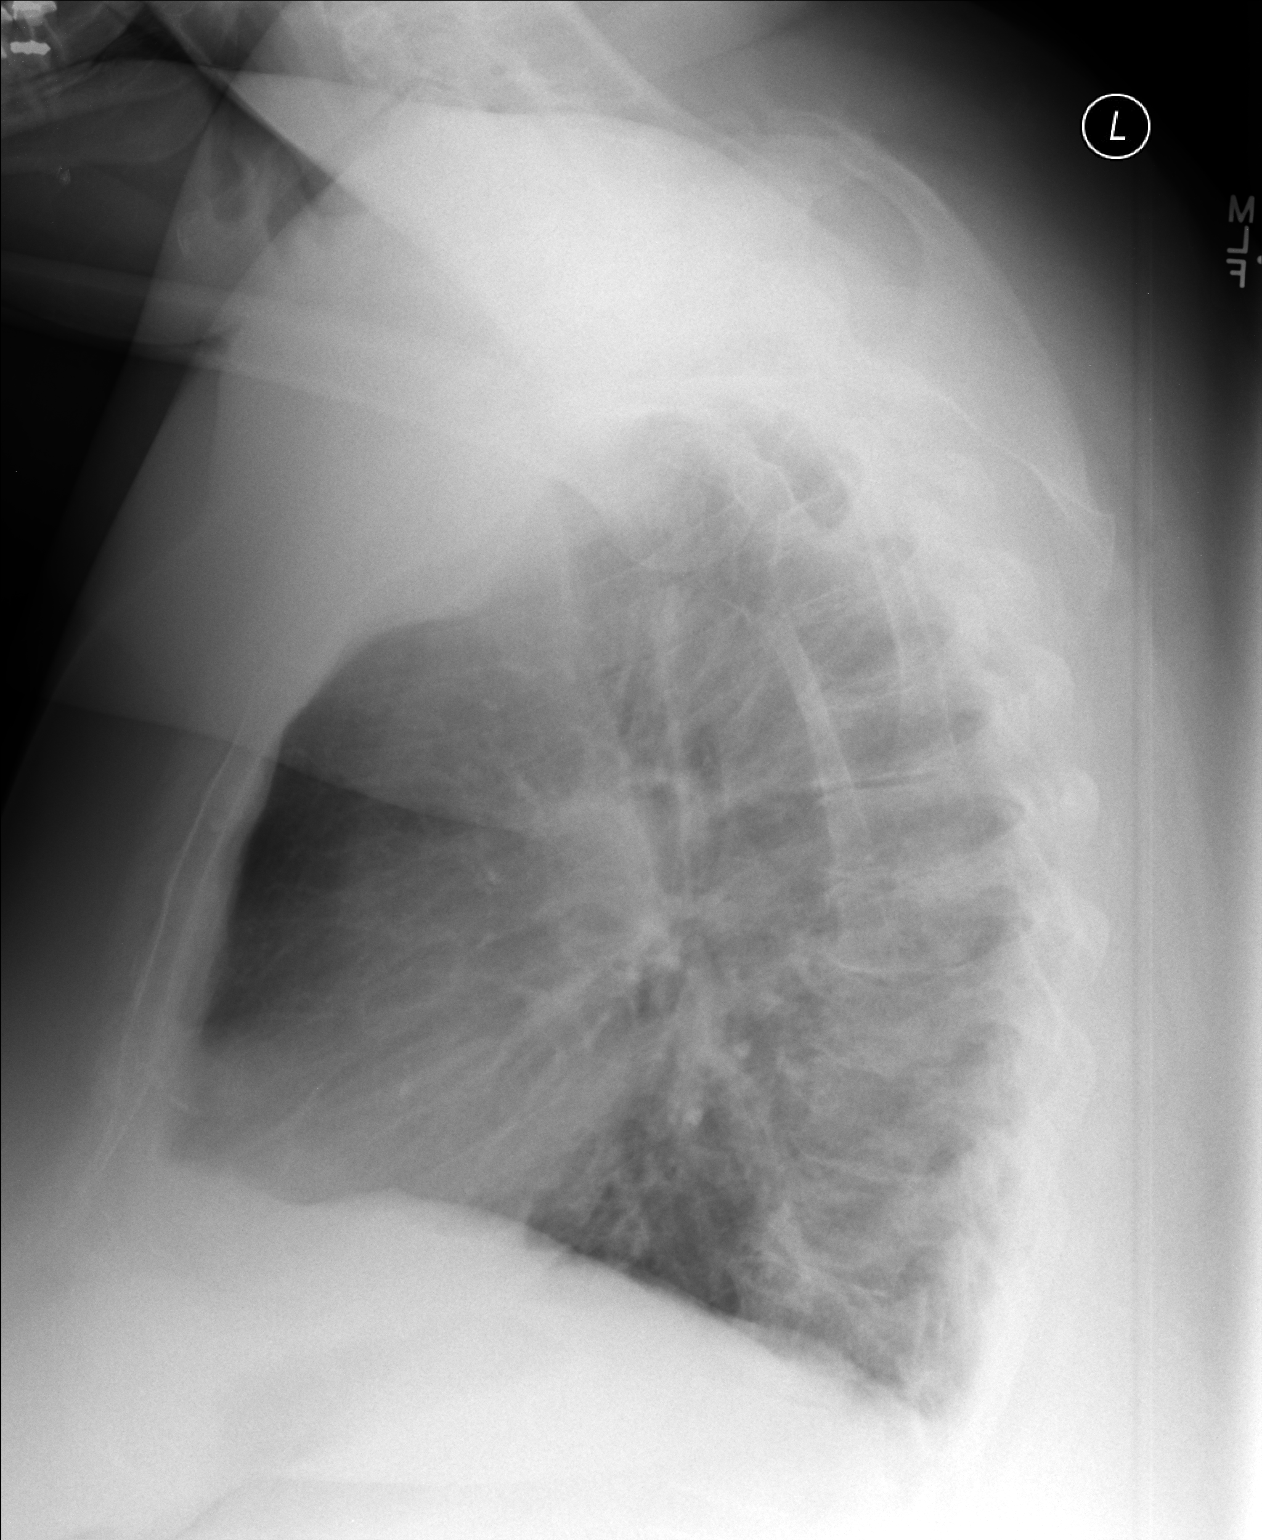

[2 of 2 positions shown; findings below may reference images not displayed]

FINDINGS: Trachea is midline. Heart size normal. Minimal linear opacification
of the left lung base. Probable prominent vascular markings at the
right lung base. Lungs are otherwise clear. No pleural fluid.
Degenerative changes are seen in the spine.
IMPRESSION: Probable linear scarring or atelectasis at the left lung base.

## 2015-02-21 ENCOUNTER — Encounter: Payer: Self-pay | Admitting: Emergency Medicine

## 2015-12-13 ENCOUNTER — Ambulatory Visit (INDEPENDENT_AMBULATORY_CARE_PROVIDER_SITE_OTHER): Payer: 59 | Admitting: Physician Assistant

## 2015-12-13 VITALS — BP 140/82 | HR 78 | Temp 98.3°F | Resp 18 | Ht 64.0 in | Wt 279.0 lb

## 2015-12-13 DIAGNOSIS — H109 Unspecified conjunctivitis: Secondary | ICD-10-CM | POA: Diagnosis not present

## 2015-12-13 MED ORDER — ERYTHROMYCIN 5 MG/GM OP OINT: TOPICAL_OINTMENT | Freq: Four times a day (QID) | OPHTHALMIC | Status: DC

## 2015-12-13 MED FILL — ERYTHROMYCIN EYE OINTMENT: 5 | 3 days supply | Qty: 4 | Fill #0

## 2015-12-13 NOTE — Progress Notes (Signed)
[facility] [address], [place] [address] [phone]  Date:  12/13/2015   Name:  Charlene Tapia   DOB:  1951/05/14   MRN:  [id number]  PCP:  Jenny Reichmann, MD   Chief Complaint  Patient presents with  . Eye Pain    itching,swelling, and red/ x3 days    History of Present Illness:  Charlene Tapia is a 65 y.o. female patient who presents to [facility] for eyelid swelling.    Started itching 3 days ago.  She had a redline across the upper eyelid.  That morning she woke up and had more itching.  Irritated feeling as well.   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   Works at the [employer].  There was no matted shut or crusting.  Watering a lot.  No sneezing though has allergies.  She put artificial tears which did not help.  No change in your vision.  No known exposure to pink eye.     Patient Active Problem List   Diagnosis Date Noted  . Arthritis of right knee 09/29/2013  . Arthritis of left knee 08/16/2013  . Arthritis of knee, left 08/15/2013  . Degenerative arthritis of left knee 05/04/2013  . Obesity, unspecified 05/04/2013  . Left knee pain 11/10/2012  . Trigger middle finger of right hand 10/31/2010  . 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 TROCHANTERIC BURSITIS, RIGHT 12/09/2008    Past Medical History:  Diagnosis Date  . Allergy   . High cholesterol   . Osteoarthritis    "knees; thumbs" (10/01/2013)    Past Surgical History:  Procedure Laterality Date  . GANGLION CYST EXCISION Right 1990's  . INGUINAL HERNIA REPAIR Bilateral 1954   "age 67 month"  . JOINT REPLACEMENT    . TOTAL KNEE ARTHROPLASTY Left 08/16/2013   Procedure: TOTAL KNEE ARTHROPLASTY;  Surgeon: Kerin Salen, MD;  Location: [facility];  Service: Orthopedics;  Laterality: Left;  . TOTAL KNEE ARTHROPLASTY Right 09/29/2013  . TOTAL KNEE ARTHROPLASTY Right 09/29/2013   Procedure: TOTAL KNEE ARTHROPLASTY;  Surgeon: Kerin Salen, MD;  Location: [facility];  Service: Orthopedics;  Laterality: Right;    Social History  Substance Use Topics  . Smoking status: Never Smoker  . Smokeless tobacco: Never Used  . Alcohol use Yes     Comment: 09/29/2013 "GLASS OF WINE < ONCE/MONTH"    Family History  Problem Relation Age of Onset  . Heart disease Father   . Hypertension Father     Allergies  Allergen Reactions  . Phisohex [Hexachlorophene] Rash    Gets blisters    Medication list has been reviewed and updated.  Current Outpatient Prescriptions on File Prior to Visit  Medication Sig Dispense Refill  . fluticasone (FLONASE) 50 MCG/ACT nasal spray Place 2 sprays into both nostrils daily as needed for allergies or rhinitis.    Marland Kitchen acetaminophen (TYLENOL) 325 MG tablet Take 650 mg by mouth every 6 (six) hours as needed for mild pain or moderate pain.    Marland Kitchen aspirin EC 325 MG tablet Take 1 tablet (325 mg total) by mouth 2 (two) times daily. (Patient not taking: Reported on 12/13/2015) 30 tablet 0  . Hypromellose (ARTIFICIAL TEARS OP) Apply 1 drop to eye 2 (two) times daily as needed (for dry eyes. [employer] brand).    . methocarbamol (ROBAXIN) 500 MG tablet Take 1 tablet (500 mg total) by mouth 2 (  two) times daily as needed for muscle spasms. (Patient not taking: Reported on 12/13/2015) 60 tablet 0  . oxyCODONE-acetaminophen (ROXICET) 5-325 MG per tablet Take 1 tablet by mouth every 4 (four) hours as needed. (Patient not taking: Reported on 12/13/2015) 60 tablet 0  . traMADol (ULTRAM) 50 MG tablet Take 1 tablet (50 mg total) by mouth at bedtime as needed for pain. (Patient not taking: Reported on 12/13/2015) 30 tablet 2   No current facility-administered medications on file prior to visit.     ROS ROS otherwise unremarkable unless listed above.   Physical Examination: BP 140/82   Pulse 78   Temp 98.3 F (36.8 C) (Oral)   Resp 18   Ht 5\' 4"  (1.626 m)   Wt 279 lb (126.6 kg)   SpO2 96%   BMI 47.89 kg/m  Ideal Body  Weight: Weight in (lb) to have BMI = 25: 145.3  Physical Exam  Constitutional: She is oriented to person, place, and time. She appears well-developed and well-nourished. No distress.  HENT:  Head: Normocephalic and atraumatic.  Right Ear: External ear normal.  Left Ear: External ear normal.  Nose: No mucosal edema or rhinorrhea.  Mouth/Throat: No oropharyngeal exudate, posterior oropharyngeal edema or posterior oropharyngeal erythema.  Eyes: EOM are normal. Pupils are equal, round, and reactive to light. No foreign body present in the right eye. Right conjunctiva is not injected. Left conjunctiva is injected. Right eye exhibits normal extraocular motion and no nystagmus. Left eye exhibits normal extraocular motion and no nystagmus.  Mild upper eyelid edema with redness.   No cobblestoning detected with lid eversion  Cardiovascular: Normal rate.   Pulmonary/Chest: Effort normal. No respiratory distress.  Neurological: She is alert and oriented to person, place, and time.  Skin: She is not diaphoretic.  Psychiatric: She has a normal mood and affect. Her behavior is normal.   Assessment and Plan: Charlene Tapia is a 65 y.o. female who is here today for eye redness. --will treat for possible abx.  Also advised otc antihistamine eye drops.  Asked for irrigation of eye --and advised starting zyrtec.   Conjunctivitis of left eye - Plan: DISCONTINUED: erythromycin ophthalmic ointment  Ivar Drape, PA-C [facility] 12/13/2015 9:04 AM

## 2015-12-13 NOTE — Patient Instructions (Signed)
Please use an over the counter anti-histamine eye drop. Please flush the eye with normal saline first.   Please let me know if you have no improvement.   Bacterial Conjunctivitis Bacterial conjunctivitis, commonly called pink eye, is an inflammation of the clear membrane that covers the white part of the eye (conjunctiva). The inflammation can also happen on the underside of the eyelids. The blood vessels in the conjunctiva become inflamed, causing the eye to become red or pink. Bacterial conjunctivitis may spread easily from one eye to another and from person to person (contagious).  CAUSES  Bacterial conjunctivitis is caused by bacteria. The bacteria may come from your own skin, your upper respiratory tract, or from someone else with bacterial conjunctivitis. SYMPTOMS  The normally white color of the eye or the underside of the eyelid is usually pink or red. The pink eye is usually associated with irritation, tearing, and some sensitivity to light. Bacterial conjunctivitis is often associated with a thick, yellowish discharge from the eye. The discharge may turn into a crust on the eyelids overnight, which causes your eyelids to stick together. If a discharge is present, there may also be some blurred vision in the affected eye. DIAGNOSIS  Bacterial conjunctivitis is diagnosed by your caregiver through an eye exam and the symptoms that you report. Your caregiver looks for changes in the surface tissues of your eyes, which may point to the specific type of conjunctivitis. A sample of any discharge may be collected on a cotton-tip swab if you have a severe case of conjunctivitis, if your cornea is affected, or if you keep getting repeat infections that do not respond to treatment. The sample will be sent to a lab to see if the inflammation is caused by a bacterial infection and to see if the infection will respond to antibiotic medicines. TREATMENT   Bacterial conjunctivitis is treated with antibiotics.  Antibiotic eyedrops are most often used. However, antibiotic ointments are also available. Antibiotics pills are sometimes used. Artificial tears or eye washes may ease discomfort. HOME CARE INSTRUCTIONS   To ease discomfort, apply a cool, clean washcloth to your eye for 10-20 minutes, 3-4 times a day.  Gently wipe away any drainage from your eye with a warm, wet washcloth or a cotton ball.  Wash your hands often with soap and water. Use paper towels to dry your hands.  Do not share towels or washcloths. This may spread the infection.  Change or wash your pillowcase every day.  You should not use eye makeup until the infection is gone.  Do not operate machinery or drive if your vision is blurred.  Stop using contact lenses. Ask your caregiver how to sterilize or replace your contacts before using them again. This depends on the type of contact lenses that you use.  When applying medicine to the infected eye, do not touch the edge of your eyelid with the eyedrop bottle or ointment tube. SEEK IMMEDIATE MEDICAL CARE IF:   Your infection has not improved within 3 days after beginning treatment.  You had yellow discharge from your eye and it returns.  You have increased eye pain.  Your eye redness is spreading.  Your vision becomes blurred.  You have a fever or persistent symptoms for more than 2-3 days.  You have a fever and your symptoms suddenly get worse.  You have facial pain, redness, or swelling. MAKE SURE YOU:   Understand these instructions.  Will watch your condition.  Will get help right away  if you are not doing well or get worse.   This information is not intended to replace advice given to you by your health care provider. Make sure you discuss any questions you have with your health care provider.   Document Released: 05/06/2005 Document Revised: 05/27/2014 Document Reviewed: 10/07/2011 Elsevier Interactive Patient Education [employer].

## 2016-06-26 DIAGNOSIS — I872 Venous insufficiency (chronic) (peripheral): Secondary | ICD-10-CM | POA: Diagnosis not present

## 2016-06-26 MED FILL — METHYLPREDNISOLONE 4 MG TAB: 4 | 6 days supply | Qty: 21 | Fill #0

## 2016-06-26 MED FILL — hydrOXYzine HCL 10 MG TABS: 10 | 15 days supply | Qty: 30 | Fill #0

## 2016-06-26 MED FILL — CLOBETASOL 0.05% OINTMENT: 0.05 | 10 days supply | Qty: 60 | Fill #0

## 2016-06-26 MED FILL — CEPHALEXIN 500 MG CAPSULE: 500 | 12 days supply | Qty: 24 | Fill #0

## 2016-07-23 DIAGNOSIS — I872 Venous insufficiency (chronic) (peripheral): Secondary | ICD-10-CM | POA: Diagnosis not present

## 2016-07-25 MED FILL — CLOBETASOL 0.05% OINTMENT: 0.05 | 10 days supply | Qty: 60 | Fill #1

## 2018-07-23 ENCOUNTER — Ambulatory Visit (INDEPENDENT_AMBULATORY_CARE_PROVIDER_SITE_OTHER): Payer: 59 | Admitting: Family Medicine

## 2018-07-23 ENCOUNTER — Other Ambulatory Visit: Payer: Self-pay

## 2018-07-23 ENCOUNTER — Encounter: Payer: Self-pay | Admitting: Family Medicine

## 2018-07-23 VITALS — BP 134/86 | HR 78 | Temp 98.7°F | Ht 64.0 in | Wt 258.0 lb

## 2018-07-23 DIAGNOSIS — Z1239 Encounter for other screening for malignant neoplasm of breast: Secondary | ICD-10-CM

## 2018-07-23 DIAGNOSIS — Z13228 Encounter for screening for other metabolic disorders: Secondary | ICD-10-CM | POA: Diagnosis not present

## 2018-07-23 DIAGNOSIS — Z23 Encounter for immunization: Secondary | ICD-10-CM | POA: Diagnosis not present

## 2018-07-23 DIAGNOSIS — Z13220 Encounter for screening for lipoid disorders: Secondary | ICD-10-CM

## 2018-07-23 DIAGNOSIS — Z1211 Encounter for screening for malignant neoplasm of colon: Secondary | ICD-10-CM

## 2018-07-23 DIAGNOSIS — M791 Myalgia, unspecified site: Secondary | ICD-10-CM | POA: Diagnosis not present

## 2018-07-23 DIAGNOSIS — E7800 Pure hypercholesterolemia, unspecified: Secondary | ICD-10-CM

## 2018-07-23 DIAGNOSIS — Z1321 Encounter for screening for nutritional disorder: Secondary | ICD-10-CM | POA: Diagnosis not present

## 2018-07-23 DIAGNOSIS — Z1329 Encounter for screening for other suspected endocrine disorder: Secondary | ICD-10-CM

## 2018-07-23 DIAGNOSIS — Z0001 Encounter for general adult medical examination with abnormal findings: Secondary | ICD-10-CM | POA: Diagnosis not present

## 2018-07-23 DIAGNOSIS — K429 Umbilical hernia without obstruction or gangrene: Secondary | ICD-10-CM | POA: Diagnosis not present

## 2018-07-23 DIAGNOSIS — Z0000 Encounter for general adult medical examination without abnormal findings: Secondary | ICD-10-CM

## 2018-07-23 DIAGNOSIS — Z780 Asymptomatic menopausal state: Secondary | ICD-10-CM

## 2018-07-23 DIAGNOSIS — Z130 Encounter for screening for diseases of the blood and blood-forming organs and certain disorders involving the immune mechanism: Secondary | ICD-10-CM | POA: Diagnosis not present

## 2018-07-23 DIAGNOSIS — Z1159 Encounter for screening for other viral diseases: Secondary | ICD-10-CM | POA: Diagnosis not present

## 2018-07-23 NOTE — Progress Notes (Signed)
3/5/202012:16 PM  Charlene Tapia 1950/10/27, 68 y.o. female [id number]  Chief Complaint  Patient presents with  . Annual Exam    completed 6 cit, functional stat, and has not made any arrangements with living wiill or poa. Will brng copy when it is done for chart. Has not been to the doctor in a long time due to taking care of elderly demented relatives    HPI:   Patient is a 68 y.o. 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, they have recently passed away  Cervical Cancer Screening: n/a Breast Cancer Screening: ordering today Colorectal Cancer Screening: ordering today, no fhx colon cancer Bone Density Testing: ordering today HIV Screening: ordering today Seasonal Influenza Vaccination: has had at work Td/Tdap Vaccination: needs today Pneumococcal Vaccination: prevnar 13 today Zoster Vaccination: will order thru [place] Frequency of Dental evaluation: has seen recenetly Frequency of Eye evaluation: she will make an appt Trying to walk more Active yard work, splinting wood, fertilizer, farm work  Occasional seems to be driven by weather changes Malaise, body aches, soreness, lead feeling in her legs Happens in the morning Take ibuprofen 857m Able to power thru it occ cramping of hands and legs  [place] [employer] No children Does not smoke, drinks rarely socially, wine   6CIT Screen 07/23/2018  What Year? 0 points  What month? 0 points  What time? 0 points  Count back from 20 0 points  Months in reverse 0 points  Repeat phrase 0 points  Total Score 0   Functional Status Survey: Is the patient deaf or have difficulty hearing?: No Does the patient have difficulty seeing, even when wearing glasses/contacts?: No Does the patient have difficulty concentrating, remembering, or making decisions?: No Does the patient have difficulty walking or climbing  stairs?: Yes(KNEE REPLACEMENT) Does the patient have difficulty dressing or bathing?: No Does the patient have difficulty doing errands alone such as visiting a doctor's office or shopping?: No  Fall Risk  07/23/2018 12/13/2015  Falls in the past year? 0 No  Number falls in past yr: 0 -  Injury with Fall? 0 -  Risk for fall due to : Impaired balance/gait -     [facility] 07/23/2018 12/13/2015  Decreased Interest 0 0  Down, Depressed, Hopeless 0 0  PHQ - 2 Score 0 0    Allergies  Allergen Reactions  . Phisohex [Hexachlorophene] Rash    Gets blisters    Prior to Admission medications   Medication Sig Start Date End Date Taking? Authorizing Provider  fluticasone (FLONASE) 50 MCG/ACT nasal spray Place 2 sprays into both nostrils daily as needed for allergies or rhinitis.   Yes [provider]    Past Medical History:  Diagnosis Date  . Allergy   . High cholesterol   . Osteoarthritis    "knees; thumbs" (10/01/2013)    Past Surgical History:  Procedure Laterality Date  . GANGLION CYST EXCISION Right 1990's  . INGUINAL HERNIA REPAIR Bilateral 1954   "age 68 month  . JOINT REPLACEMENT    . 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 TOTAL KNEE ARTHROPLASTY Right 09/29/2013  . TOTAL KNEE ARTHROPLASTY Right 09/29/2013   Procedure: TOTAL KNEE ARTHROPLASTY;  Surgeon: FPilar Plate  Karmen Bongo, MD;  Location: [place];  Service: Orthopedics;  Laterality: Right;    Social History   Tobacco Use  . Smoking status: Never Smoker  . Smokeless tobacco: Never Used  Substance Use Topics  . Alcohol use: Yes    Comment: 09/29/2013 "GLASS OF WINE < ONCE/MONTH"    Family History  Problem Relation Age of Onset  . Heart disease Father   . Hypertension Father     Review of Systems  Constitutional: Negative for chills and fever.  Respiratory: Negative for  cough and shortness of breath.   Cardiovascular: Negative for chest pain, palpitations and leg swelling.  Gastrointestinal: Negative for abdominal pain, nausea and vomiting.  Musculoskeletal: Positive for joint pain and myalgias.  Neurological: Negative for tingling and focal weakness.  All other systems reviewed and are negative. per hpi   OBJECTIVE:  Blood pressure 134/86, pulse 78, temperature 98.7 F (37.1 C), temperature source Oral, height 5' 4"  (1.626 m), weight 258 lb (117 kg), SpO2 94 %. Body mass index is 44.29 kg/m.   Physical Exam Vitals signs and nursing note reviewed.  Constitutional:      Appearance: She is well-developed.  HENT:     Head: Normocephalic and atraumatic.     Right Ear: Hearing, tympanic membrane, ear canal and external ear normal.     Left Ear: Hearing, tympanic membrane, ear canal and external ear normal.  Eyes:     Conjunctiva/sclera: Conjunctivae normal.     Pupils: Pupils are equal, round, and reactive to light.  Neck:     Musculoskeletal: Neck supple.     Thyroid: No thyromegaly.  Cardiovascular:     Rate and Rhythm: Normal rate and regular rhythm.     Heart sounds: Normal heart sounds. No murmur. No friction rub. No gallop.   Pulmonary:     Effort: Pulmonary effort is normal.     Breath sounds: Normal breath sounds. No wheezing or rales.  Abdominal:     General: Bowel sounds are normal. There is no distension.     Palpations: Abdomen is soft. There is no mass.     Tenderness: There is no abdominal tenderness.     Hernia: A hernia is present. Hernia is present in the umbilical area (large, not reducible, not tender).  Musculoskeletal: Normal range of motion.     Right lower leg: No edema.     Left lower leg: No edema.  Lymphadenopathy:     Cervical: No cervical adenopathy.  Skin:    General: Skin is warm and dry.  Neurological:     General: No focal deficit present.     Mental Status: She is alert and oriented to person, place, and  time.     Cranial Nerves: No cranial nerve deficit.     Gait: Gait normal.     Deep Tendon Reflexes: Reflexes are normal and symmetric.  Psychiatric:        Mood and Affect: Mood normal.     ASSESSMENT and PLAN  1. Annual physical exam Routine HCM labs ordered. HCM reviewed/discussed. Anticipatory guidance regarding healthy weight, lifestyle and choices given.   2. Need for vaccination - Pneumococcal conjugate vaccine 13-valent IM  3. Screening for breast cancer - MM Digital Screening; Future  4. Special screening for malignant neoplasms, colon - Cologuard  5. Need for hepatitis C screening test - Hepatitis C antibody  6. Screening for endocrine, nutritional, metabolic and immunity disorder - CMP14+EGFR  7. Screening for thyroid disorder - TSH  8. Screening for lipid disorders - Lipid panel  9. Postmenopausal estrogen deficiency - DG Bone Density; Future  10. Myalgia - Sedimentation Rate - C-reactive protein  11. Umbilical hernia without obstruction and without gangrene - Ambulatory referral to General Surgery  Other orders - Td vaccine greater than or equal to 7yo preservative free IM   Return in about 4 weeks (around 08/20/2018).    Rutherford Guys, MD [facility] [place], [place] [address] Ph.  [phone] Fax [phone]

## 2018-07-24 LAB — CMP14+EGFR
ALT: 19 IU/L (ref 0–32)
AST: 16 IU/L (ref 0–40)
Albumin/Globulin Ratio: 1.7 (ref 1.2–2.2)
Albumin: 4.2 g/dL (ref 3.8–4.8)
Alkaline Phosphatase: 70 IU/L (ref 39–117)
BUN/Creatinine Ratio: 14 (ref 12–28)
BUN: 9 mg/dL (ref 8–27)
Bilirubin Total: 0.5 mg/dL (ref 0.0–1.2)
CO2: 21 mmol/L (ref 20–29)
Calcium: 9.6 mg/dL (ref 8.7–10.3)
Chloride: 101 mmol/L (ref 96–106)
Creatinine, Ser: 0.65 mg/dL (ref 0.57–1.00)
GFR calc Af Amer: 107 mL/min/{1.73_m2} (ref >59)
GFR calc non Af Amer: 93 mL/min/{1.73_m2} (ref >59)
Globulin, Total: 2.5 g/dL (ref 1.5–4.5)
Glucose: 87 mg/dL (ref 65–99)
Potassium: 4.2 mmol/L (ref 3.5–5.2)
Sodium: 142 mmol/L (ref 134–144)
Total Protein: 6.7 g/dL (ref 6.0–8.5)

## 2018-07-24 LAB — LIPID PANEL
Chol/HDL Ratio: 4.8 ratio — ABNORMAL HIGH (ref 0.0–4.4)
Cholesterol, Total: 227 mg/dL — ABNORMAL HIGH (ref 100–199)
HDL: 47 mg/dL (ref >39)
LDL Calculated: 157 mg/dL — ABNORMAL HIGH (ref 0–99)
Triglycerides: 116 mg/dL (ref 0–149)
VLDL Cholesterol Cal: 23 mg/dL (ref 5–40)

## 2018-07-24 LAB — C-REACTIVE PROTEIN: CRP: 2 mg/L (ref 0–10)

## 2018-07-24 LAB — TSH: TSH: 1.62 u[IU]/mL (ref 0.450–4.500)

## 2018-07-24 LAB — SEDIMENTATION RATE: Sed Rate: 3 mm/hr (ref 0–40)

## 2018-07-24 LAB — HEPATITIS C ANTIBODY: Hep C Virus Ab: <0.1 s/co ratio (ref 0.0–0.9)

## 2018-08-05 DIAGNOSIS — K429 Umbilical hernia without obstruction or gangrene: Secondary | ICD-10-CM | POA: Diagnosis not present

## 2018-08-07 MED ORDER — ATORVASTATIN CALCIUM 10 MG PO TABS: 10.0000 mg | ORAL_TABLET | Freq: Every day | ORAL | 3 refills | Status: DC

## 2018-08-07 NOTE — Addendum Note (Signed)
Addended by: Rutherford Guys on: 08/07/2018 09:36 AM   Modules accepted: Orders

## 2018-08-27 ENCOUNTER — Ambulatory Visit: Payer: 59 | Admitting: Family Medicine

## 2018-09-18 ENCOUNTER — Telehealth: Payer: 59 | Admitting: Family

## 2018-09-18 DIAGNOSIS — L309 Dermatitis, unspecified: Secondary | ICD-10-CM | POA: Diagnosis not present

## 2018-09-18 MED ORDER — CLOBETASOL PROP EMOLLIENT BASE 0.05 % EX CREA: 1.0000 "application " | TOPICAL_CREAM | Freq: Two times a day (BID) | CUTANEOUS | 0 refills | Status: DC

## 2018-09-18 NOTE — Progress Notes (Signed)
E Visit for Rash  We are sorry that you are not feeling well. Here is how we plan to help!  I have sent in the cream you requested to be used twice a day.    HOME CARE:   Take cool showers and avoid direct sunlight.  Apply cool compress or wet dressings.  Take a bath in an oatmeal bath.  Sprinkle content of one Aveeno packet under running faucet with comfortably warm water.  Bathe for 15-20 minutes, 1-2 times daily.  Pat dry with a towel. Do not rub the rash.  Use hydrocortisone cream.  Take an antihistamine like Benadryl for widespread rashes that itch.  The adult dose of Benadryl is 25-50 mg by mouth 4 times daily.  Caution:  This type of medication may cause sleepiness.  Do not drink alcohol, drive, or operate dangerous machinery while taking antihistamines.  Do not take these medications if you have prostate enlargement.  Read package instructions thoroughly on all medications that you take.  GET HELP RIGHT AWAY IF:   Symptoms don't go away after treatment.  Severe itching that persists.  If you rash spreads or swells.  If you rash begins to smell.  If it blisters and opens or develops a yellow-brown crust.  You develop a fever.  You have a sore throat.  You become short of breath.  MAKE SURE YOU:  Understand these instructions. Will watch your condition. Will get help right away if you are not doing well or get worse.  Thank you for choosing an e-visit. Your e-visit answers were reviewed by a board certified advanced clinical practitioner to complete your personal care plan. Depending upon the condition, your plan could have included both over the counter or prescription medications. Please review your pharmacy choice. Be sure that the pharmacy you have chosen is open so that you can pick up your prescription now.  If there is a problem you may message your provider in Center to have the prescription routed to another pharmacy. Your safety is important to [place].  If you have drug allergies check your prescription carefully.  For the next 24 hours, you can use MyChart to ask questions about today's visit, request a non-urgent call back, or ask for a work or school excuse from your e-visit provider. You will get an email in the next two days asking about your experience. I hope that your e-visit has been valuable and will speed your recovery.    Greater than 5 minutes, yet less than 10 minutes of time have been spent researching, coordinating, and implementing care for this patient today.  Thank you for the details you included in the comment boxes. Those details are very helpful in determining the best course of treatment for you and help [place] to provide the best care.  Greater than 5 minutes, yet less than 10 minutes of time have been spent researching, coordinating, and implementing care for this patient today.  Thank you for the details you included in the comment boxes. Those details are very helpful in determining the best course of treatment for you and help [place] to provide the best care.

## 2018-09-28 DIAGNOSIS — Z1211 Encounter for screening for malignant neoplasm of colon: Secondary | ICD-10-CM | POA: Diagnosis not present

## 2018-10-22 ENCOUNTER — Encounter: Payer: Self-pay | Admitting: Family Medicine

## 2018-10-23 LAB — COLOGUARD: Cologuard: NEGATIVE

## 2018-11-16 ENCOUNTER — Ambulatory Visit: Payer: 59 | Admitting: Family Medicine

## 2019-02-04 ENCOUNTER — Other Ambulatory Visit: Payer: Self-pay | Admitting: Family

## 2019-07-29 ENCOUNTER — Ambulatory Visit (INDEPENDENT_AMBULATORY_CARE_PROVIDER_SITE_OTHER): Payer: 59 | Admitting: Pediatrics

## 2019-07-29 ENCOUNTER — Other Ambulatory Visit: Payer: Self-pay

## 2019-07-29 VITALS — BP 178/83 | Ht 62.5 in | Wt 286.0 lb

## 2019-07-29 DIAGNOSIS — M25551 Pain in right hip: Secondary | ICD-10-CM | POA: Diagnosis not present

## 2019-07-29 MED ORDER — PREDNISONE 10 MG PO TABS: ORAL_TABLET | ORAL | 0 refills | Status: DC

## 2019-07-29 MED ORDER — METHOCARBAMOL 500 MG PO TABS: 500.0000 mg | ORAL_TABLET | Freq: Three times a day (TID) | ORAL | 0 refills | Status: DC | PRN

## 2019-07-29 MED FILL — predniSONE 10 MG TABS: 10 | 6 days supply | Qty: 21 | Fill #0

## 2019-07-29 MED FILL — METHOCARBAMOL 500 MG TABS: 500 | 14 days supply | Qty: 40 | Fill #0

## 2019-07-29 NOTE — Progress Notes (Signed)
Charlene Tapia - 69 y.o. female MRN [id number]  Date of birth: Sep 09, 1950  SUBJECTIVE:   CC: right hip pain and cramping   69 yo female presenting with 3 months of right buttocks and hip cramping. She reports that pain started in mid December, would occur suddenly. It progressed to more frequently and was especially aggravated by driving- sometimes buttocks and side of hip will crmap after driving to and from work. She is also having weakness and pain on inside of right leg (sometimes feels like a "stretched rubber band") and she is also getting occasion numbness/tingling down the front of her leg. No pain into the groin. She walks for exericse and is able to do this still but does sometimes feel weakness in her right leg. She has started using a cane and taking smaller steps to support her right leg.   PMHx: No history of back pain. H/o neck pain, improved with PT Bilateral knee replacements in 2015  H/o HTN. No history of diabetes.  Anxiety/bad reaction with going in MRI machine in the past  ROS: No unexpected weight loss, fever, chills, swelling, instability, muscle pain, numbness/tingling, redness, otherwise see HPI   PMHx - Updated and reviewed.  Contributory factors include: Negative PSHx - Updated and reviewed.  Contributory factors include:  Negative FHx - Updated and reviewed.  Contributory factors include:  Negative Social Hx - U pdated and reviewed. Contributory factors include: Negative Medications - reviewed   DATA REVIEWED: Prior records   PHYSICAL EXAM:  VS: BP:(!) 178/83  HR: bpm  TEMP: ( )  RESP:   HT:5' 2.5" (158.8 cm)   WT:286 lb (129.7 kg)  BMI:51.44 PHYSICAL EXAM: Gen: NAD, alert, cooperative with exam, well-appearing HEENT: clear conjunctiva,  CV:  no edema, capillary refill brisk, normal rate Resp: non-labored Skin: no rashes, normal turgor  Neuro: no gross deficits.  Psych:  alert and oriented  Lumbar spine: - Inspection: no gross deformity or  asymmetry, swelling or ecchymosis - Palpation: No TTP over the spinous processes, paraspinal muscles, or SI joints b/l - ROM: full active ROM of the lumbar spine in flexion and extension. After lumbar extension, she feels cramping in R hip. - Strength: 5/5 strength of lower extremity in L4-S1 nerve root distributions b/l; normal gait - Neuro: sensation intact in the L4-S1 nerve root distribution b/l, 2+ S1 reflexes (L4 reflexes absent with TKR) - Special testing: Negative straight leg raise, negative slump  Right Hip:  - Inspection: No gross deformity, no swelling, erythema, or ecchymosis - Palpation: No TTP, specifically none over greater trochanter - ROM: Normal range of motion on Flexion, extension, abduction, internal and external rotation - Strength: Normal strength. - Neuro/vasc: NV intact distally - Special Tests: Negative FABER and FADIR.   Tense gluteus maximus and medius appreciated after lumbar extension    ASSESSMENT & PLAN: Suspect that patient's right hip cramping and pain for the past 3 months, as well as weakness in adductor strength and numbness/tingling down front of leg are from L3 radiculopathy. Unclear if she has compression from disc protrusion vs arthritic changes in spine. Discussed various options for treatment and next steps at this time. Patient opted for prednisone dose pack to settle down neuropathic pain, muscle relaxer as needed, and referral to PT for lumbar spine. If no improvement with PT, will obtain lumbar x-rays. Patient would like to try PT first. She also does not think she can tolerate an MRI (if we feel this is needed in the future)  as she had a very bad experience when she tried to obtain MRI of her neck several years ago.  I was the preceptor for this visit and available for immediate consultation Shellia Cleverly, DO

## 2019-07-29 NOTE — Patient Instructions (Signed)
I think the pain in your hip is coming from nerves in your lumbar spine.  Start PT Take prednsione as prescribed Take ibuprofen and robaxin as needed Return in 1 month

## 2019-08-04 ENCOUNTER — Ambulatory Visit: Payer: 59 | Attending: Sports Medicine | Admitting: Physical Therapy

## 2019-08-04 ENCOUNTER — Encounter: Payer: Self-pay | Admitting: Physical Therapy

## 2019-08-04 ENCOUNTER — Other Ambulatory Visit: Payer: Self-pay

## 2019-08-04 DIAGNOSIS — R2689 Other abnormalities of gait and mobility: Secondary | ICD-10-CM | POA: Diagnosis not present

## 2019-08-04 DIAGNOSIS — M25551 Pain in right hip: Secondary | ICD-10-CM | POA: Insufficient documentation

## 2019-08-04 DIAGNOSIS — M25651 Stiffness of right hip, not elsewhere classified: Secondary | ICD-10-CM | POA: Insufficient documentation

## 2019-08-05 NOTE — Therapy (Signed)
[place], [place], [address] Phone: [phone]   Fax:  [phone]  Physical Therapy Evaluation  Patient Details  Name: Charlene Tapia MRN: [id number] Date of Birth: 1950-06-07 Referring Provider (PT): Dr Amedeo Kinsman    Encounter Date: 08/04/2019  PT End of Session - 08/04/19 0827    Visit Number  1    Number of Visits  12    Date for PT Re-Evaluation  09/15/19    Authorization Type  [facility]    PT Start Time  0805    PT Stop Time  0845    PT Time Calculation (min)  40 min    Activity Tolerance  Patient tolerated treatment well    Behavior During Therapy  [facility] for tasks assessed/performed       Past Medical History:  Diagnosis Date  . Allergy   . High cholesterol   . Osteoarthritis    "knees; thumbs" (10/01/2013)    Past Surgical History:  Procedure Laterality Date  . GANGLION CYST EXCISION Right 1990's  . INGUINAL HERNIA REPAIR Bilateral 1954   "age 32 month"  . JOINT REPLACEMENT    . TOTAL KNEE ARTHROPLASTY Left 08/16/2013   Procedure: TOTAL KNEE ARTHROPLASTY;  Surgeon: Kerin Salen, MD;  Location: [place];  Service: Orthopedics;  Laterality: Left;  . TOTAL KNEE ARTHROPLASTY Right 09/29/2013  . TOTAL KNEE ARTHROPLASTY Right 09/29/2013   Procedure: TOTAL KNEE ARTHROPLASTY;  Surgeon: Kerin Salen, MD;  Location: [place];  Service: Orthopedics;  Laterality: Right;    There were no vitals filed for this visit.   Subjective Assessment - 08/04/19 0810    Subjective  Patient began having pain in December of 2020 in her right hip. She has increased pain when she is driving. She has been on prednisone which has helped. Every once and while she goes down the front of her thigh.    Pertinent History  Bilateral knee replacement, arthritix of the knees and thumb.    How long can you sit comfortably?  sitting in the car causes pain    How long can you stand comfortably?  dosne't feel stable    How  long can you walk comfortably?  dosen't feel stable    Diagnostic tests  nothing    Patient Stated Goals  to have less pain    Currently in Pain?  Yes    Pain Score  3     Pain Location  Hip    Pain Orientation  Right    Pain Descriptors / Indicators  Aching    Pain Type  Chronic pain    Pain Onset  More than a month ago    Pain Frequency  Constant    Aggravating Factors   Sitting in a car    Pain Relieving Factors  prednisone; ibuprofin    Effect of Pain on Daily Activities  pain while driving         [facility] PT Assessment - 08/05/19 0001      Assessment   Medical Diagnosis  Right sided hip pain     Referring Provider (PT)  Dr Amedeo Kinsman     Onset Date/Surgical Date  --   Decemebr 2020    Hand Dominance  Right    Next MD Visit  1 month     Prior Therapy  For her neck       Precautions   Precautions  None  Restrictions   Weight Bearing Restrictions  No      Balance Screen   Has the patient fallen in the past 6 months  No    Has the patient had a decrease in activity level because of a fear of falling?   No    Is the patient reluctant to leave their home because of a fear of falling?   No      Home Environment   Additional Comments  No steps       Prior Function   Level of Independence  Independent    Vocation  Full time employment    Vocation Requirements  Works at the [employer] for [employer]       Cognition   Overall Cognitive Status  Within Functional Limits for tasks assessed    Attention  Focused    Focused Attention  Appears intact    Memory  Appears intact    Awareness  Appears intact    Problem Solving  Appears intact      Observation/Other Assessments   Focus on Therapeutic Outcomes (FOTO)   46% limitation 31% expected       Sensation   Light Touch  Appears Intact    Additional Comments  can radiate into the fornt of the thigh       Coordination   Gross Motor Movements are Fluid and Coordinated  Yes    Fine Motor  Movements are Fluid and Coordinated  Yes      AROM   Right Hip Flexion  80   with pain    Right Hip Internal Rotation   --   limited IR with mild pain      PROM   Right Hip Flexion  85   with pain    Right Hip Internal Rotation   --   limited with mild pain      Strength   Right Hip Flexion  4/5    Right Hip ABduction  4+/5    Right Hip ADduction  4+/5    Right Knee Flexion  5/5    Right Knee Extension  5/5      Palpation   Palpation comment  tendenress to palpation       Special Tests   Other special tests  FABR test (-)       Ambulation/Gait   Gait Comments  decreased bilateral hip flexion; right foot supination in standing ; patient using a cane on the right side. Therapy will work on gait pattern                 Objective measurements completed on examination: See above findings.              PT Education - 08/04/19 0826    Person(s) Educated  Patient    Methods  Explanation;Demonstration;Tactile cues;Verbal cues    Comprehension  Returned demonstration;Verbal cues required;Tactile cues required;Verbalized understanding       PT Short Term Goals - 08/04/19 1447      PT SHORT TERM GOAL #1   Title  Patient will ambualte 500' without AD with no reports of instability    Time  3    Period  Weeks    Status  New    Target Date  08/25/19      PT SHORT TERM GOAL #2   Title  Patient will increase right hip flexion to 95 degrees    Time  3  Period  Weeks    Status  New    Target Date  08/25/19      PT SHORT TERM GOAL #3   Title  Patient will increase right hip flexion strength to 5/5    Time  3    Period  Weeks    Status  New    Target Date  08/25/19        PT Long Term Goals - 08/04/19 1451      PT LONG TERM GOAL #1   Title  Patient will drive 30 minutes without increased low back pain/ right hip pain    Time  6    Period  Weeks    Status  New    Target Date  09/15/19      PT LONG TERM GOAL #2   Title  Patient will stand fo  1 hour without report of increased right hip pain    Time  6    Period  Weeks    Status  New    Target Date  09/15/19      PT LONG TERM GOAL #3   Title  Patient will demonstrate a 31 % limitation on FOTO to show improved function    Time  6    Period  Weeks    Status  New    Target Date  09/15/19             Plan - 08/04/19 0827    Clinical Impression Statement  Patient is a 69 year old female with right hip pain that radiates into her right thigh. She has taken a dose pack of prednisone which hs heled but she continues to have pain. She has limited active and passive hip flexion. Hip flexion past 85 degrees reporduces her pain which is likely giveng her difficulty driving. She would benefit from skilled therapy to reduce pain adn improve hip movement and strength.    Personal Factors and Comorbidities  Comorbidity 1;Comorbidity 2;Profession   sits at work   Comorbidities  obeisty, bilateral knee replacement    Examination-Activity Limitations  Sit;Locomotion Level    Examination-Participation Restrictions  Driving    Stability/Clinical Decision Making  Evolving/Moderate complexity    Clinical Decision Making  Moderate    Rehab Potential  Excellent    PT Frequency  2x / week    PT Duration  6 weeks    PT Treatment/Interventions  ADLs/Self Care Home Management;Cryotherapy;Electrical Stimulation;Moist Heat;Traction;Ultrasound;Gait training;Functional mobility training;Therapeutic activities;Therapeutic exercise;Neuromuscular re-education;Patient/family education;Manual techniques;Dry needling;Passive range of motion;Taping    PT Next Visit Plan  patients biggest deficit is in active and passive left hip flexion. She would benefit from supine PA glides of the hip and inferior mobilization. She would benefit from soft tissue mobilization to the hip and the back. The patient reports she is unablto lie on eighter side which maymake manual to the back a little more dfficult; consider  leaning on a ball. consider standing and supine core and hip strengthening. Add 2-3 exercises next visit for home. patient was unabe to perfrom self hip flexion without pain    PT Home Exercise Plan  LTR, tennis ball trigger point release; seated hamstring stretch for work       Patient will benefit from skilled therapeutic intervention in order to improve the following deficits and impairments:  Abnormal gait, Difficulty walking, Decreased activity tolerance, Decreased strength, Decreased safety awareness, Increased fascial restricitons, Obesity, Decreased range of motion, Increased muscle spasms  Visit Diagnosis: Pain in  right hip  Other abnormalities of gait and mobility  Stiffness of right hip, not elsewhere classified     Problem List Patient Active Problem List   Diagnosis Date Noted  . Arthritis of right knee 09/29/2013  . Arthritis of left knee 08/16/2013  . Arthritis of knee, left 08/15/2013  . Degenerative arthritis of left knee 05/04/2013  . Obesity, unspecified 05/04/2013  . Left knee pain 11/10/2012  . Trigger middle finger of right hand 10/31/2010  . 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 [name], RIGHT 12/09/2008    Carney Living 08/05/2019, 9:34 AM  [facility] [address] [place], [place], [address] Phone: [phone]   Fax:  [phone]  Name: CONCEPSION MCALISTER MRN: [id number] Date of Birth: 1951-01-13

## 2019-08-09 ENCOUNTER — Other Ambulatory Visit: Payer: Self-pay

## 2019-08-09 ENCOUNTER — Ambulatory Visit: Payer: 59

## 2019-08-09 DIAGNOSIS — M25551 Pain in right hip: Secondary | ICD-10-CM | POA: Diagnosis not present

## 2019-08-09 DIAGNOSIS — M25651 Stiffness of right hip, not elsewhere classified: Secondary | ICD-10-CM

## 2019-08-09 DIAGNOSIS — R2689 Other abnormalities of gait and mobility: Secondary | ICD-10-CM | POA: Diagnosis not present

## 2019-08-09 NOTE — Therapy (Signed)
[place], [place], [address] Phone: [phone]   Fax:  [phone]  Physical Therapy Treatment  Patient Details  Name: Charlene Tapia MRN: [id number] Date of Birth: 17-Sep-1950 Referring Provider (PT): Dr Amedeo Kinsman    Encounter Date: 08/09/2019  PT End of Session - 08/09/19 0711    Visit Number  2    Number of Visits  12    Date for PT Re-Evaluation  09/15/19    Authorization Type  [facility] U[facility]    PT Start Time  0705    PT Stop Time  0745    PT Time Calculation (min)  40 min    Activity Tolerance  Patient tolerated treatment well    Behavior During Therapy  [facility] for tasks assessed/performed       Past Medical History:  Diagnosis Date  . Allergy   . High cholesterol   . Osteoarthritis    "knees; thumbs" (10/01/2013)    Past Surgical History:  Procedure Laterality Date  . GANGLION CYST EXCISION Right 1990's  . INGUINAL HERNIA REPAIR Bilateral 1954   "age 52 month"  . JOINT REPLACEMENT    . TOTAL KNEE ARTHROPLASTY Left 08/16/2013   Procedure: TOTAL KNEE ARTHROPLASTY;  Surgeon: Kerin Salen, MD;  Location: [place];  Service: Orthopedics;  Laterality: Left;  . TOTAL KNEE ARTHROPLASTY Right 09/29/2013  . TOTAL KNEE ARTHROPLASTY Right 09/29/2013   Procedure: TOTAL KNEE ARTHROPLASTY;  Surgeon: Kerin Salen, MD;  Location: [place];  Service: Orthopedics;  Laterality: Right;    There were no vitals filed for this visit.  Subjective Assessment - 08/09/19 0703    Subjective  She reports pain i hip and MD said may be coming form her back.    Pain now  in lateral and anterior hip to knee.  Did HEp every other day as she gets stiff and sore  post exercise.    Pain Score  2     Pain Location  Hip    Pain Orientation  Right    Pain Descriptors / Indicators  Aching    Pain Type  Chronic pain    Pain Onset  More than a month ago    Pain Frequency  Constant    Aggravating Factors   sitting    Pain  Relieving Factors  meds                       [facility] Adult PT Treatment/Exercise - 08/09/19 0001      Exercises   Exercises  Knee/Hip      Knee/Hip Exercises: Stretches   Passive Hamstring Stretch  Right;2 reps;30 seconds    Piriformis Stretch  Right;2 reps;30 seconds    Other Knee/Hip Stretches  Trunk flexion ad side bend to Lt x 10 each    Other Knee/Hip Stretches  LTR x 10 RT/LT       Knee/Hip Exercises: Aerobic   Nustep  L4 LE 5 min      Knee/Hip Exercises: Supine   Other Supine Knee/Hip Exercises  PPT with legs on large bolster.  2 sets of 12 with hamstring engaged heels into bolster.       Manual Therapy   Manual Therapy  Joint mobilization;Passive ROM    Joint Mobilization  AP glides RT hip ,      Passive ROM  SLR and  rotation Rt hip     Manual Traction  gentle manual traction right  LE  2 sets of 100                PT Short Term Goals - 08/04/19 1447      PT SHORT TERM GOAL #1   Title  Patient will ambualte 500' without AD with no reports of instability    Time  3    Period  Weeks    Status  New    Target Date  08/25/19      PT SHORT TERM GOAL #2   Title  Patient will increase right hip flexion to 95 degrees    Time  3    Period  Weeks    Status  New    Target Date  08/25/19      PT SHORT TERM GOAL #3   Title  Patient will increase right hip flexion strength to 5/5    Time  3    Period  Weeks    Status  New    Target Date  08/25/19        PT Long Term Goals - 08/04/19 1451      PT LONG TERM GOAL #1   Title  Patient will drive 30 minutes without increased low back pain/ right hip pain    Time  6    Period  Weeks    Status  New    Target Date  09/15/19      PT LONG TERM GOAL #2   Title  Patient will stand fo 1 hour without report of increased right hip pain    Time  6    Period  Weeks    Status  New    Target Date  09/15/19      PT LONG TERM GOAL #3   Title  Patient will demonstrate a 31 % limitation on FOTO to show  improved function    Time  6    Period  Weeks    Status  New    Target Date  09/15/19            Plan - 08/09/19 0702    Clinical Impression Statement  Able to do the exercises without increased pain. flexion of trunk did not incr pain.  she reported feeling some better post.  Cautioned to stop or ease off if increased pain    PT Treatment/Interventions  ADLs/Self Care Home Management;Cryotherapy;Electrical Stimulation;Moist Heat;Traction;Ultrasound;Gait training;Functional mobility training;Therapeutic activities;Therapeutic exercise;Neuromuscular re-education;Patient/family education;Manual techniques;Dry needling;Passive range of motion;Taping    PT Next Visit Plan  Continue and add to HEP as able    PT Home Exercise Plan  LTR, tennis ball trigger point release; seated hamstring stretch for work, PPT    Consulted and Agree with Plan of Care  Patient       Patient will benefit from skilled therapeutic intervention in order to improve the following deficits and impairments:  Abnormal gait, Difficulty walking, Decreased activity tolerance, Decreased strength, Decreased safety awareness, Increased fascial restricitons, Obesity, Decreased range of motion, Increased muscle spasms  Visit Diagnosis: Other abnormalities of gait and mobility  Stiffness of right hip, not elsewhere classified  Pain in right hip     Problem List Patient Active Problem List   Diagnosis Date Noted  . Arthritis of right knee 09/29/2013  . Arthritis of left knee 08/16/2013  . Arthritis of knee, left 08/15/2013  . Degenerative arthritis of left knee 05/04/2013  . Obesity, unspecified 05/04/2013  . Left knee pain 11/10/2012  . Trigger middle finger of right  hand 10/31/2010  . 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 [name], RIGHT 12/09/2008    Darrel Hoover PT 08/09/2019, 7:48 AM  [facility] [address] [place], [place], [address] Phone: [phone]   Fax:  [phone]  Name: Charlene Tapia MRN: [id number] Date of Birth: 1950/12/04

## 2019-08-17 ENCOUNTER — Encounter: Payer: Self-pay | Admitting: Physical Therapy

## 2019-08-17 ENCOUNTER — Other Ambulatory Visit: Payer: Self-pay

## 2019-08-17 ENCOUNTER — Ambulatory Visit: Payer: 59 | Admitting: Physical Therapy

## 2019-08-17 DIAGNOSIS — M25551 Pain in right hip: Secondary | ICD-10-CM

## 2019-08-17 DIAGNOSIS — M25651 Stiffness of right hip, not elsewhere classified: Secondary | ICD-10-CM | POA: Diagnosis not present

## 2019-08-17 DIAGNOSIS — R2689 Other abnormalities of gait and mobility: Secondary | ICD-10-CM | POA: Diagnosis not present

## 2019-08-17 NOTE — Therapy (Signed)
[place], [place], [address] Phone: [phone]   Fax:  [phone]  Physical Therapy Treatment  Patient Details  Name: Charlene Tapia MRN: [id number] Date of Birth: Jul 09, 1950 Referring Provider (PT): Dr Amedeo Kinsman    Encounter Date: 08/17/2019  PT End of Session - 08/17/19 0828    Visit Number  3    Number of Visits  12    Date for PT Re-Evaluation  09/15/19    Authorization Type  [facility] U[facility]    PT Start Time  0745    PT Stop Time  0825    PT Time Calculation (min)  40 min    Activity Tolerance  Patient tolerated treatment well    Behavior During Therapy  [facility] for tasks assessed/performed       Past Medical History:  Diagnosis Date  . Allergy   . High cholesterol   . Osteoarthritis    "knees; thumbs" (10/01/2013)    Past Surgical History:  Procedure Laterality Date  . GANGLION CYST EXCISION Right 1990's  . INGUINAL HERNIA REPAIR Bilateral 1954   "age 39 month"  . JOINT REPLACEMENT    . TOTAL KNEE ARTHROPLASTY Left 08/16/2013   Procedure: TOTAL KNEE ARTHROPLASTY;  Surgeon: Kerin Salen, MD;  Location: [place];  Service: Orthopedics;  Laterality: Left;  . TOTAL KNEE ARTHROPLASTY Right 09/29/2013  . TOTAL KNEE ARTHROPLASTY Right 09/29/2013   Procedure: TOTAL KNEE ARTHROPLASTY;  Surgeon: Kerin Salen, MD;  Location: [place];  Service: Orthopedics;  Laterality: Right;    There were no vitals filed for this visit.  Subjective Assessment - 08/17/19 0803    Subjective  Patient reports she is doing well. She did something over the weekend to aggravate her hip.    Patient Stated Goals  to have less pain    Currently in Pain?  Yes    Pain Score  2     Pain Location  Hip    Pain Orientation  Right    Pain Descriptors / Indicators  Aching    Pain Type  Chronic pain    Pain Onset  More than a month ago    Pain Frequency  Constant         [facility] PT Assessment - 08/17/19 0001      AROM    Overall AROM Comments  Patient exhibits lumbar motion grossly WFL and non painful in all directions                   [facility] Adult PT Treatment/Exercise - 08/17/19 0001      Exercises   Exercises  Knee/Hip      Knee/Hip Exercises: Stretches   Other Knee/Hip Stretches  Seated trunk flexion fwd and lateral using a stool to push out x10 each    Other Knee/Hip Stretches  Seated trunk rotation stretch x10 each      Knee/Hip Exercises: Aerobic   Nustep  L4 x 6 min LE only      Knee/Hip Exercises: Standing   Hip Flexion  2 sets;10 reps    Hip Flexion Limitations  marching    Hip Abduction  2 sets;10 reps    Hip Extension  2 sets;10 reps      Knee/Hip Exercises: Seated   Other Seated Knee/Hip Exercises  Seated pelvic tilts 2x10      Manual Therapy   Manual Therapy  Manual Traction    Manual Traction  LAD to RLE x  5 min with gentle mobs             PT Education - 08/17/19 0804    Education Details  HEP, stretching at a slower pace    Person(s) Educated  Patient    Methods  Explanation;Demonstration;Verbal cues    Comprehension  Verbalized understanding;Returned demonstration;Verbal cues required;Need further instruction       PT Short Term Goals - 08/04/19 1447      PT SHORT TERM GOAL #1   Title  Patient will ambualte 500' without AD with no reports of instability    Time  3    Period  Weeks    Status  New    Target Date  08/25/19      PT SHORT TERM GOAL #2   Title  Patient will increase right hip flexion to 95 degrees    Time  3    Period  Weeks    Status  New    Target Date  08/25/19      PT SHORT TERM GOAL #3   Title  Patient will increase right hip flexion strength to 5/5    Time  3    Period  Weeks    Status  New    Target Date  08/25/19        PT Long Term Goals - 08/04/19 1451      PT LONG TERM GOAL #1   Title  Patient will drive 30 minutes without increased low back pain/ right hip pain    Time  6    Period  Weeks    Status  New     Target Date  09/15/19      PT LONG TERM GOAL #2   Title  Patient will stand fo 1 hour without report of increased right hip pain    Time  6    Period  Weeks    Status  New    Target Date  09/15/19      PT LONG TERM GOAL #3   Title  Patient will demonstrate a 31 % limitation on FOTO to show improved function    Time  6    Period  Weeks    Status  New    Target Date  09/15/19            Plan - 08/17/19 0829    Clinical Impression Statement  Patient doing well with stretches she is doing at home. No increased pain with stretches this visit and she was instructed on methods to perform seated stretches at home consisting of trunk rotation and using computer chair to do forward and lateral trunk flexion stretch. Standing strengthening initiated this visit, patient reported feeling a cramp coming on so used supine LAD to right leg with good relief. She would benefit from continued skilled PT to reduce pain with activity.    PT Treatment/Interventions  ADLs/Self Care Home Management;Cryotherapy;Electrical Stimulation;Moist Heat;Traction;Ultrasound;Gait training;Functional mobility training;Therapeutic activities;Therapeutic exercise;Neuromuscular re-education;Patient/family education;Manual techniques;Dry needling;Passive range of motion;Taping    PT Next Visit Plan  Continue and add to HEP as able    PT Home Exercise Plan  LTR, tennis ball trigger point release; seated hamstring stretch for work, PPT    Consulted and Agree with Plan of Care  Patient       Patient will benefit from skilled therapeutic intervention in order to improve the following deficits and impairments:  Abnormal gait, Difficulty walking, Decreased activity tolerance, Decreased strength, Decreased safety awareness, Increased fascial restricitons,  Obesity, Decreased range of motion, Increased muscle spasms  Visit Diagnosis: Other abnormalities of gait and mobility  Stiffness of right hip, not elsewhere  classified  Pain in right hip     Problem List Patient Active Problem List   Diagnosis Date Noted  . Arthritis of right knee 09/29/2013  . Arthritis of left knee 08/16/2013  . Arthritis of knee, left 08/15/2013  . Degenerative arthritis of left knee 05/04/2013  . Obesity, unspecified 05/04/2013  . Left knee pain 11/10/2012  . Trigger middle finger of right hand 10/31/2010  . 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 TROCHANTERIC BURSITIS, RIGHT 12/09/2008    Hilda Blades, PT, DPT, LAT, ATC 08/17/19  8:47 AM Phone: [phone] Fax: [facility] [facility] [address] [place], [place], [address] Phone: [phone]   Fax:  [phone]  Name: Charlene Tapia MRN: [id number] Date of Birth: [birth year]

## 2019-08-23 ENCOUNTER — Ambulatory Visit: Payer: 59 | Attending: Sports Medicine | Admitting: Physical Therapy

## 2019-08-23 ENCOUNTER — Encounter: Payer: Self-pay | Admitting: Physical Therapy

## 2019-08-23 ENCOUNTER — Other Ambulatory Visit: Payer: Self-pay

## 2019-08-23 DIAGNOSIS — R2689 Other abnormalities of gait and mobility: Secondary | ICD-10-CM | POA: Insufficient documentation

## 2019-08-23 DIAGNOSIS — M25551 Pain in right hip: Secondary | ICD-10-CM | POA: Diagnosis not present

## 2019-08-23 DIAGNOSIS — M25651 Stiffness of right hip, not elsewhere classified: Secondary | ICD-10-CM | POA: Diagnosis not present

## 2019-08-23 NOTE — Therapy (Signed)
[place], [place], [address] Phone: [phone]   Fax:  [phone]  Physical Therapy Treatment  Patient Details  Name: Charlene Tapia MRN: [id number] Date of Birth: January 24, 1951 Referring Provider (PT): Dr Amedeo Kinsman    Encounter Date: 08/23/2019  PT End of Session - 08/23/19 0710    Visit Number  4    Number of Visits  12    Date for PT Re-Evaluation  09/15/19    Authorization Type  [facility] [employer]    PT Start Time  0702    PT Stop Time  0742    PT Time Calculation (min)  40 min    Activity Tolerance  Patient tolerated treatment well    Behavior During Therapy  [name] for tasks assessed/performed       Past Medical History:  Diagnosis Date  . Allergy   . High cholesterol   . Osteoarthritis    "knees; thumbs" (10/01/2013)    Past Surgical History:  Procedure Laterality Date  . GANGLION CYST EXCISION Right 1990's  . INGUINAL HERNIA REPAIR Bilateral 1954   "age 90 month"  . JOINT REPLACEMENT    . TOTAL KNEE ARTHROPLASTY Left 08/16/2013   Procedure: TOTAL KNEE ARTHROPLASTY;  Surgeon: Kerin Salen, MD;  Location: [place];  Service: Orthopedics;  Laterality: Left;  . TOTAL KNEE ARTHROPLASTY Right 09/29/2013  . TOTAL KNEE ARTHROPLASTY Right 09/29/2013   Procedure: TOTAL KNEE ARTHROPLASTY;  Surgeon: Kerin Salen, MD;  Location: [place];  Service: Orthopedics;  Laterality: Right;    There were no vitals filed for this visit.  Subjective Assessment - 08/23/19 0706    Subjective  Patient reports she is doing well. She is having less pain overall and wasn't as sore following last visit. She is not using her cane because she feels more stable and the hip is not giving out on her.    Patient Stated Goals  to have less pain    Currently in Pain?  Yes    Pain Score  0-No pain    Pain Location  Hip    Pain Orientation  Right    Pain Descriptors / Indicators  --    Pain Type  --    Pain Onset  --    Pain  Frequency  --                       [facility] Adult PT Treatment/Exercise - 08/23/19 0001      Exercises   Exercises  Knee/Hip      Knee/Hip Exercises: Stretches   Other Knee/Hip Stretches  Seated trunk flexion fwd and lateral using a stool to push out x5 each    Other Knee/Hip Stretches  Seated trunk rotation stretch x5 each      Knee/Hip Exercises: Aerobic   Nustep  L4 x 6 min LE only      Knee/Hip Exercises: Standing   Hip Flexion  2 sets;10 reps    Hip Flexion Limitations  marching    Hip Abduction  2 sets;10 reps    Hip Extension  2 sets;10 reps    Lateral Step Up  10 reps;Step Height: 6";Hand Hold: 1    Forward Step Up  10 reps;Step Height: 6";Hand Hold: 1      Knee/Hip Exercises: Seated   Other Seated Knee/Hip Exercises  Seated pelvic tilts x15             PT Education -  08/23/19 0709    Education Details  HEP    Person(s) Educated  Patient    Methods  Explanation;Demonstration;Verbal cues;Handout    Comprehension  Verbalized understanding;Returned demonstration;Verbal cues required;Need further instruction       PT Short Term Goals - 08/04/19 1447      PT SHORT TERM GOAL #1   Title  Patient will ambualte 500' without AD with no reports of instability    Time  3    Period  Weeks    Status  New    Target Date  08/25/19      PT SHORT TERM GOAL #2   Title  Patient will increase right hip flexion to 95 degrees    Time  3    Period  Weeks    Status  New    Target Date  08/25/19      PT SHORT TERM GOAL #3   Title  Patient will increase right hip flexion strength to 5/5    Time  3    Period  Weeks    Status  New    Target Date  08/25/19        PT Long Term Goals - 08/04/19 1451      PT LONG TERM GOAL #1   Title  Patient will drive 30 minutes without increased low back pain/ right hip pain    Time  6    Period  Weeks    Status  New    Target Date  09/15/19      PT LONG TERM GOAL #2   Title  Patient will stand fo 1 hour without  report of increased right hip pain    Time  6    Period  Weeks    Status  New    Target Date  09/15/19      PT LONG TERM GOAL #3   Title  Patient will demonstrate a 31 % limitation on FOTO to show improved function    Time  6    Period  Weeks    Status  New    Target Date  09/15/19            Plan - 08/23/19 0710    Clinical Impression Statement  Patient progressing well with stretching and strengthening exercises. No increased pain or cramping reported this visit. She does continue to exhibit limited hip flexion on right. She would benefit from continued skilled PT to reduce pain with activity.    PT Treatment/Interventions  ADLs/Self Care Home Management;Cryotherapy;Electrical Stimulation;Moist Heat;Traction;Ultrasound;Gait training;Functional mobility training;Therapeutic activities;Therapeutic exercise;Neuromuscular re-education;Patient/family education;Manual techniques;Dry needling;Passive range of motion;Taping    PT Next Visit Plan  Continue and add to HEP as able, core and hip strenthening    PT Home Exercise Plan  LTR (can perform seated trunk roations), tennis ball trigger point release, seated hamstring stretch for work, PPT, standing hip abduction, extension, and marching,    Consulted and Agree with Plan of Care  Patient       Patient will benefit from skilled therapeutic intervention in order to improve the following deficits and impairments:  Abnormal gait, Difficulty walking, Decreased activity tolerance, Decreased strength, Decreased safety awareness, Increased fascial restricitons, Obesity, Decreased range of motion, Increased muscle spasms  Visit Diagnosis: Other abnormalities of gait and mobility  Stiffness of right hip, not elsewhere classified  Pain in right hip     Problem List Patient Active Problem List   Diagnosis Date Noted  . Arthritis of right knee  09/29/2013  . Arthritis of left knee 08/16/2013  . Arthritis of knee, left 08/15/2013  .  Degenerative arthritis of left knee 05/04/2013  . Obesity, unspecified 05/04/2013  . Left knee pain 11/10/2012  . Trigger middle finger of right hand 10/31/2010  . 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 [name], RIGHT 12/09/2008    Bobette Mo 08/23/2019, 7:41 AM  [facility] [address] [place], [place], [address] Phone: [phone]   Fax:  [phone]  Name: Charlene Tapia MRN: [id number] Date of Birth: 1951/02/02

## 2019-08-26 ENCOUNTER — Ambulatory Visit: Payer: 59 | Admitting: Physical Therapy

## 2019-08-26 ENCOUNTER — Encounter: Payer: Self-pay | Admitting: Physical Therapy

## 2019-08-26 ENCOUNTER — Other Ambulatory Visit: Payer: Self-pay

## 2019-08-26 DIAGNOSIS — M25551 Pain in right hip: Secondary | ICD-10-CM

## 2019-08-26 DIAGNOSIS — M25651 Stiffness of right hip, not elsewhere classified: Secondary | ICD-10-CM | POA: Diagnosis not present

## 2019-08-26 DIAGNOSIS — R2689 Other abnormalities of gait and mobility: Secondary | ICD-10-CM

## 2019-08-26 NOTE — Therapy (Signed)
[place], [place], [address] Phone: [phone]   Fax:  [phone]  Physical Therapy Treatment  Patient Details  Name: Charlene Tapia MRN: [id number] Date of Birth: 08/02/1950 Referring Provider (PT): Dr Amedeo Kinsman    Encounter Date: 08/26/2019  PT End of Session - 08/26/19 0713    Visit Number  5    Number of Visits  12    Date for PT Re-Evaluation  09/15/19    Authorization Type  [facility]    PT Start Time  0705    PT Stop Time  0743    PT Time Calculation (min)  38 min    Activity Tolerance  Patient tolerated treatment well    Behavior During Therapy  [facility] for tasks assessed/performed       Past Medical History:  Diagnosis Date  . Allergy   . High cholesterol   . Osteoarthritis    "knees; thumbs" (10/01/2013)    Past Surgical History:  Procedure Laterality Date  . GANGLION CYST EXCISION Right 1990's  . INGUINAL HERNIA REPAIR Bilateral 1954   "age 89 month"  . JOINT REPLACEMENT    . TOTAL KNEE ARTHROPLASTY Left 08/16/2013   Procedure: TOTAL KNEE ARTHROPLASTY;  Surgeon: Kerin Salen, MD;  Location: [place];  Service: Orthopedics;  Laterality: Left;  . TOTAL KNEE ARTHROPLASTY Right 09/29/2013  . TOTAL KNEE ARTHROPLASTY Right 09/29/2013   Procedure: TOTAL KNEE ARTHROPLASTY;  Surgeon: Kerin Salen, MD;  Location: [place];  Service: Orthopedics;  Laterality: Right;    There were no vitals filed for this visit.  Subjective Assessment - 08/26/19 0710    Subjective  Patient reports she is doing well, she feels working the muscles is alleviating the problem.    Patient Stated Goals  to have less pain    Currently in Pain?  No/denies         [facility] PT Assessment - 08/26/19 0001      PROM   Right Hip Flexion  90      Strength   Right Hip Flexion  4/5                   [facility] Adult PT Treatment/Exercise - 08/26/19 0001      Exercises   Exercises  Knee/Hip      Knee/Hip  Exercises: Aerobic   Nustep  L4 x 6 min LE only      Knee/Hip Exercises: Standing   Hip Flexion  2 sets;10 reps    Hip Flexion Limitations  marching    Hip Abduction  2 sets;10 reps    Abduction Limitations  2nd set with yellow band around knees    Hip Extension  2 sets;10 reps    Extension Limitations  2nd set with yellow band around knees    Lateral Step Up  10 reps;Step Height: 6";Hand Hold: 1    Forward Step Up  10 reps;Step Height: 6";Hand Hold: 1      Knee/Hip Exercises: Seated   Long Arc Quad  15 reps    Other Seated Knee/Hip Exercises  Seated pelvic tilts x15      Knee/Hip Exercises: Supine   Straight Leg Raises  10 reps    Other Supine Knee/Hip Exercises  Posterior pelvic tilt x10             PT Education - 08/26/19 0712    Education Details  HEP    Person(s) Educated  Patient  Methods  Explanation;Demonstration;Verbal cues    Comprehension  Verbalized understanding;Returned demonstration;Verbal cues required;Need further instruction       PT Short Term Goals - 08/26/19 0734      PT SHORT TERM GOAL #1   Title  Patient will ambualte 500' without AD with no reports of instability    Time  3    Period  Weeks    Status  Achieved    Target Date  --      PT SHORT TERM GOAL #2   Title  Patient will increase right hip flexion to 95 degrees    Time  3    Period  Weeks    Status  On-going    Target Date  08/25/19      PT SHORT TERM GOAL #3   Title  Patient will increase right hip flexion strength to 5/5    Time  3    Period  Weeks    Status  On-going    Target Date  08/25/19        PT Long Term Goals - 08/04/19 1451      PT LONG TERM GOAL #1   Title  Patient will drive 30 minutes without increased low back pain/ right hip pain    Time  6    Period  Weeks    Status  New    Target Date  09/15/19      PT LONG TERM GOAL #2   Title  Patient will stand fo 1 hour without report of increased right hip pain    Time  6    Period  Weeks    Status  New     Target Date  09/15/19      PT LONG TERM GOAL #3   Title  Patient will demonstrate a 31 % limitation on FOTO to show improved function    Time  6    Period  Weeks    Status  New    Target Date  09/15/19            Plan - 08/26/19 0728    Clinical Impression Statement  Patient tolerated therapy well with no adverse effects. She is progressing well with hip strengthening but continues to demonstrate limitation with right hip flexon motion and reports anterior hip pain at end range. She notes that she feels she can lift the right leg high while performing standing marching exercise. She would benefit from continued skilled PT to reduce right hip pain with activity.    PT Treatment/Interventions  ADLs/Self Care Home Management;Cryotherapy;Electrical Stimulation;Moist Heat;Traction;Ultrasound;Gait training;Functional mobility training;Therapeutic activities;Therapeutic exercise;Neuromuscular re-education;Patient/family education;Manual techniques;Dry needling;Passive range of motion;Taping    PT Next Visit Plan  Continue and add to HEP as able, core and hip strenthening, manual and mobs for right hip PRN    PT Home Exercise Plan  LTR (can perform seated trunk rotations), tennis ball trigger point release, seated hamstring stretch for work, PPT, standing hip abduction, extension, and marching (add yellow band), fwd and lateral step-ups on 6"    Consulted and Agree with Plan of Care  Patient       Patient will benefit from skilled therapeutic intervention in order to improve the following deficits and impairments:  Abnormal gait, Difficulty walking, Decreased activity tolerance, Decreased strength, Decreased safety awareness, Increased fascial restricitons, Obesity, Decreased range of motion, Increased muscle spasms  Visit Diagnosis: Pain in right hip  Stiffness of right hip, not elsewhere classified  Other abnormalities of gait  and mobility     Problem List Patient Active Problem  List   Diagnosis Date Noted  . Arthritis of right knee 09/29/2013  . Arthritis of left knee 08/16/2013  . Arthritis of knee, left 08/15/2013  . Degenerative arthritis of left knee 05/04/2013  . Obesity, unspecified 05/04/2013  . Left knee pain 11/10/2012  . Trigger middle finger of right hand 10/31/2010  . 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 TROCHANTERIC BURSITIS, RIGHT 12/09/2008    Hilda Blades, PT, DPT, LAT, ATC 08/26/19  7:50 AM Phone: [phone] Fax: [facility] [address] [place], [place], [address] Phone: [phone]   Fax:  [phone]  Name: Charlene Tapia MRN: [id number] Date of Birth: Oct 09, 1950

## 2019-08-30 ENCOUNTER — Other Ambulatory Visit: Payer: Self-pay

## 2019-08-30 ENCOUNTER — Ambulatory Visit: Payer: 59

## 2019-08-30 DIAGNOSIS — R2689 Other abnormalities of gait and mobility: Secondary | ICD-10-CM | POA: Diagnosis not present

## 2019-08-30 DIAGNOSIS — M25651 Stiffness of right hip, not elsewhere classified: Secondary | ICD-10-CM

## 2019-08-30 DIAGNOSIS — M25551 Pain in right hip: Secondary | ICD-10-CM | POA: Diagnosis not present

## 2019-08-30 NOTE — Patient Instructions (Signed)
Stand DF stretch  30 sec x 2 2x/day

## 2019-08-30 NOTE — Therapy (Addendum)
[place], [place], [address] Phone: [phone]   Fax:  [phone]  Physical Therapy Treatment/Discharge  Patient Details  Name: Charlene Tapia MRN: [id number] Date of Birth: 1950-09-21 Referring Provider (PT): Dr Charlene Tapia    Encounter Date: 08/30/2019  PT End of Session - 08/30/19 0705    Visit Number  6    Number of Visits  12    Date for PT Re-Evaluation  09/15/19    Authorization Type  [facility]    PT Start Time  0705    PT Stop Time  0744    PT Time Calculation (min)  39 min    Activity Tolerance  Patient tolerated treatment well       Past Medical History:  Diagnosis Date  . Allergy   . High cholesterol   . Osteoarthritis    "knees; thumbs" (10/01/2013)    Past Surgical History:  Procedure Laterality Date  . GANGLION CYST EXCISION Right 1990's  . INGUINAL HERNIA REPAIR Bilateral 1954   "age 48 month"  . JOINT REPLACEMENT    . TOTAL KNEE ARTHROPLASTY Left 08/16/2013   Procedure: TOTAL KNEE ARTHROPLASTY;  Surgeon: Charlene Salen, MD;  Location: [place];  Service: Orthopedics;  Laterality: Left;  . TOTAL KNEE ARTHROPLASTY Right 09/29/2013  . TOTAL KNEE ARTHROPLASTY Right 09/29/2013   Procedure: TOTAL KNEE ARTHROPLASTY;  Surgeon: Charlene Salen, MD;  Location: [place];  Service: Orthopedics;  Laterality: Right;    There were no vitals filed for this visit.  Subjective Assessment - 08/30/19 0707    Subjective  Very milde piain today. walking nbetter . no instability    Currently in Pain?  Yes    Pain Score  1     Pain Location  Hip    Pain Orientation  Right    Pain Descriptors / Indicators  Aching    Pain Type  Chronic pain    Pain Onset  More than a month ago    Pain Frequency  Intermittent    Aggravating Factors   step ups, marching exer    Pain Relieving Factors  meds         [facility] PT Assessment - 08/30/19 0001      Observation/Other Assessments   Focus on Therapeutic  Outcomes (FOTO)   48% limited                   [facility] Adult PT Treatment/Exercise - 08/30/19 0001      Knee/Hip Exercises: Aerobic   Nustep  L4 x 6 min LE only      Knee/Hip Exercises: Standing   Hip Flexion  2 sets;10 reps    Hip Flexion Limitations  marching    Hip Abduction  Right;Left;15 reps    Hip Extension  Left;Right;15 reps    Lateral Step Up  10 reps;Step Height: 6";Hand Hold: 1    Forward Step Up  10 reps;Step Height: 6";Hand Hold: 1      Knee/Hip Exercises: Seated   Long Arc Quad  Right;Left;20 reps    Other Seated Knee/Hip Exercises  Seated pelvic tilts x15      Manual Therapy   Joint Mobilization  AP glides RT hip ,      Manual Traction  LAD to RLE x 5 min with gentle mobs             PT Education - 08/30/19 0755    Education Details  DF stretch  Person(s) Educated  Patient    Methods  Explanation;Demonstration;Verbal cues;Handout;Tactile cues    Comprehension  Verbalized understanding;Returned demonstration       PT Short Term Goals - 08/26/19 0734      PT SHORT TERM GOAL #1   Title  Patient will ambualte 500' without AD with no reports of instability    Time  3    Period  Weeks    Status  Achieved    Target Date  --      PT SHORT TERM GOAL #2   Title  Patient will increase right hip flexion to 95 degrees    Time  3    Period  Weeks    Status  On-going    Target Date  08/25/19      PT SHORT TERM GOAL #3   Title  Patient will increase right hip flexion strength to 5/5    Time  3    Period  Weeks    Status  On-going    Target Date  08/25/19        PT Long Term Goals - 08/30/19 0711      PT LONG TERM GOAL #1   Title  Patient will drive 30 minutes without increased low back pain/ right hip pain    Status  Achieved      PT LONG TERM GOAL #2   Title  Patient will stand fo 1 hour without report of increased right hip pain    Baseline  60 min +    Status  Achieved            Plan - 08/30/19 0751    Clinical  Impression Statement  She is doing well with decreased pain nad improved function. ROM of hip unchanged and is sore post stretching and heavier loaded exercise;m    PT Treatment/Interventions  ADLs/Self Care Home Management;Cryotherapy;Electrical Stimulation;Moist Heat;Traction;Ultrasound;Gait training;Functional mobility training;Therapeutic activities;Therapeutic exercise;Neuromuscular re-education;Patient/family education;Manual techniques;Dry needling;Passive range of motion;Taping    PT Next Visit Plan  Continue and add to HEP as able, core and hip strenthening, manual and mobs for right hip PRN    PT Home Exercise Plan  LTR (can perform seated trunk rotations), tennis ball trigger point release, seated hamstring stretch for work, PPT, standing hip abduction, extension, and marching (add yellow band), fwd and lateral step-ups on 6"    Consulted and Agree with Plan of Care  Patient       Patient will benefit from skilled therapeutic intervention in order to improve the following deficits and impairments:  Abnormal gait, Difficulty walking, Decreased activity tolerance, Decreased strength, Decreased safety awareness, Increased fascial restricitons, Obesity, Decreased range of motion, Increased muscle spasms  Visit Diagnosis: Pain in right hip  Stiffness of right hip, not elsewhere classified     Problem List Patient Active Problem List   Diagnosis Date Noted  . Arthritis of right knee 09/29/2013  . Arthritis of left knee 08/16/2013  . Arthritis of knee, left 08/15/2013  . Degenerative arthritis of left knee 05/04/2013  . Obesity, unspecified 05/04/2013  . Left knee pain 11/10/2012  . Trigger middle finger of right hand 10/31/2010  . 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 [name], RIGHT 12/09/2008    Charlene Tapia PT 08/30/2019, 7:56 AM  [facility] [address] [place], [place], [address] Phone: [phone]   Fax:  [phone]  Name: Charlene Tapia MRN: [id number] Date of Birth: [birth year]  PHYSICAL THERAPY DISCHARGE SUMMARY  Visits from Start of Care: 6  Current functional level related to goals / functional outcomes: See above. She canceled last appointment due to family emergency and did not return  Remaining deficits: Unknown   Education / Equipment: HEP  Plan:                                                    Patient goals were not met. Patient is being discharged due to not returning since the last visit.  ?????   Pearson Forster PT 11/29/19

## 2019-09-02 ENCOUNTER — Ambulatory Visit: Payer: 59

## 2021-08-01 ENCOUNTER — Ambulatory Visit: Payer: 59 | Admitting: Registered Nurse

## 2021-08-01 ENCOUNTER — Other Ambulatory Visit (HOSPITAL_COMMUNITY): Payer: Self-pay

## 2021-08-01 ENCOUNTER — Encounter: Payer: Self-pay | Admitting: Registered Nurse

## 2021-08-01 VITALS — BP 163/72 | HR 76 | Temp 97.9°F | Resp 18 | Ht 62.5 in | Wt 279.8 lb

## 2021-08-01 DIAGNOSIS — Z23 Encounter for immunization: Secondary | ICD-10-CM

## 2021-08-01 DIAGNOSIS — L309 Dermatitis, unspecified: Secondary | ICD-10-CM | POA: Diagnosis not present

## 2021-08-01 MED ORDER — CLOBETASOL PROP EMOLLIENT BASE 0.05 % EX CREA
1.0000 "application " | TOPICAL_CREAM | Freq: Two times a day (BID) | CUTANEOUS | 2 refills | Status: DC
  Filled 2021-08-01: qty 30, 10d supply, fill #0
  Filled 2021-08-22: qty 30, 10d supply, fill #1
  Filled 2021-12-03: qty 30, 10d supply, fill #2

## 2021-08-01 NOTE — Progress Notes (Signed)
? ?New Patient Office Visit ? ?Subjective:  ?Patient ID: Charlene Tapia, female    DOB: 01/03/1951  Age: 71 y.o. MRN: [id number] ? ?CC:  ?Chief Complaint  ?Patient presents with  ? New Patient (Initial Visit)  ?  Patient states she is here to establish care. Patient would like a medication refill  ? ? ?HPI ?Charlene Tapia presents for visit to est care ? ?Histories reviewed and updated with patient.  ? ?Weight ?Interested in losing weight ?Admits food as comfort, particularly junk food. ?Has been active in the past, looking to get back to this ?Interested in meeting with dietician and MWM ? ?Eczema ?A few spots that recur ?Treated well with intermittent clotrimazole use. ?Has run out, would like refill ?Does note use on face or groin. ? ?Cramps ?Hx of OA, bilateral tka ?Still cramps in both legs ?"Ratcheting pain" ?Drinks gatorade/other electrolytes daily with some relief.  ?Has been in PT and used prednisone with good effect.  ? ? ?Past Medical History:  ?Diagnosis Date  ? Allergy   ? High cholesterol   ? Osteoarthritis   ? "knees; thumbs" (10/01/2013)  ? ? ?Past Surgical History:  ?Procedure Laterality Date  ? GANGLION CYST EXCISION Right 01/18/1989  ? INGUINAL HERNIA REPAIR Bilateral 1954  ? "age 68 month"  ? JOINT REPLACEMENT    ? TOTAL KNEE ARTHROPLASTY Left 08/16/2013  ? Procedure: TOTAL KNEE ARTHROPLASTY;  Surgeon: Kerin Salen, MD;  Location: [facility];  Service: Orthopedics;  Laterality: Left;  ? TOTAL KNEE ARTHROPLASTY Right 09/29/2013  ? TOTAL KNEE ARTHROPLASTY Right 09/29/2013  ? Procedure: TOTAL KNEE ARTHROPLASTY;  Surgeon: Kerin Salen, MD;  Location: [facility];  Service: Orthopedics;  Laterality: Right;  ? ? ?Family History  ?Problem Relation Age of Onset  ? Heart disease Father   ? Hypertension Father   ? Alcohol abuse Father   ? ? ?Social History  ? ?Socioeconomic History  ? Marital status: Married  ?  Spouse name: Not on file  ? Number of children: Not on file  ? Years of education: Not on file  ?  Highest education level: Not on file  ?Occupational History  ? Not on file  ?Tobacco Use  ? Smoking status: Never  ? Smokeless tobacco: Never  ?Substance and Sexual Activity  ? Alcohol use: Yes  ?  Comment: Social drinking wine only  ? Drug use: No  ? Sexual activity: Not Currently  ?  Comment: 20 years of age/spouse w/ ED  ?Other Topics Concern  ? Not on file  ?Social History Narrative  ? Not on file  ? ?Social Determinants of Health  ? ?Financial Resource Strain: Not on file  ?Food Insecurity: Not on file  ?Transportation Needs: Not on file  ?Physical Activity: Not on file  ?Stress: Not on file  ?Social Connections: Not on file  ?Intimate Partner Violence: Not on file  ? ? ?ROS ?Review of Systems  ?Constitutional: Negative.   ?HENT: Negative.    ?Eyes: Negative.   ?Respiratory: Negative.    ?Cardiovascular: Negative.   ?Gastrointestinal: Negative.   ?Genitourinary: Negative.   ?Musculoskeletal: Negative.   ?Skin: Negative.   ?Neurological: Negative.   ?Psychiatric/Behavioral: Negative.    ?All other systems reviewed and are negative. ? ?Objective:  ? ?Today's Vitals: BP (!) 163/72   Pulse 76   Temp 97.9 ?F (36.6 ?C) (Temporal)   Resp 18   Ht 5' 2.5" (1.588 m)   Wt 279 lb 12.8 oz (126.9  kg)   SpO2 97%   BMI 50.36 kg/m?  ? ?Physical Exam ?Vitals and nursing note reviewed.  ?Constitutional:   ?   General: She is not in acute distress. ?   Appearance: Normal appearance. She is not ill-appearing, toxic-appearing or diaphoretic.  ?Cardiovascular:  ?   Rate and Rhythm: Normal rate and regular rhythm.  ?   Pulses: Normal pulses.  ?   Heart sounds: Normal heart sounds. No murmur heard. ?  No friction rub. No gallop.  ?Pulmonary:  ?   Effort: Pulmonary effort is normal. No respiratory distress.  ?   Breath sounds: Normal breath sounds. No stridor. No wheezing, rhonchi or rales.  ?Chest:  ?   Chest wall: No tenderness.  ?Skin: ?   General: Skin is warm and dry.  ?   Capillary Refill: Capillary refill takes less than 2  seconds.  ?Neurological:  ?   General: No focal deficit present.  ?   Mental Status: She is alert and oriented to person, place, and time. Mental status is at baseline.  ?Psychiatric:     ?   Mood and Affect: Mood normal.     ?   Behavior: Behavior normal.     ?   Thought Content: Thought content normal.     ?   Judgment: Judgment normal.  ? ? ?Assessment & Plan:  ? ?Problem List Items Addressed This Visit   ?None ?Visit Diagnoses   ? ? Eczema, unspecified type    -  Primary  ? Relevant Medications  ? Clobetasol Prop Emollient Base (CLOBETASOL PROPIONATE E) 0.05 % emollient cream  ? Need for shingles vaccine      ? Relevant Orders  ? Varicella-zoster vaccine IM (Shingrix) (Completed)  ? Need for prophylactic vaccination against Streptococcus pneumoniae (pneumococcus)      ? Relevant Orders  ? Pneumococcal polysaccharide vaccine 23-valent greater than or equal to 2yo subcutaneous/IM (Completed)  ? Obesity, morbid, BMI 50 or higher ([place])      ? Relevant Orders  ? Amb Ref to Medical Weight Management  ? Amb ref to Medical Nutrition Therapy-MNT  ? ?  ? ? ?Outpatient Encounter Medications as of 08/01/2021  ?Medication Sig  ? fluticasone (FLONASE) 50 MCG/ACT nasal spray Place 2 sprays into both nostrils daily as needed for allergies or rhinitis.  ? ibuprofen (ADVIL) 200 MG tablet Take 200 mg by mouth every 6 (six) hours as needed.  ? [DISCONTINUED] Clobetasol Prop Emollient Base (CLOBETASOL PROPIONATE E) 0.05 % emollient cream Apply 1 application topically 2 (two) times daily.  ? Clobetasol Prop Emollient Base (CLOBETASOL PROPIONATE E) 0.05 % emollient cream Apply to affected area 2 (two) times daily.  ? [DISCONTINUED] atorvastatin (LIPITOR) 10 MG tablet Take 1 tablet (10 mg total) by mouth daily. (Patient not taking: Reported on 08/04/2019)  ? [DISCONTINUED] methocarbamol (ROBAXIN) 500 MG tablet Take 1 tablet (500 mg total) by mouth every 8 (eight) hours as needed for muscle spasms.  ? [DISCONTINUED] predniSONE (DELTASONE)  10 MG tablet Take as prescribed on the handout  ? ?No facility-administered encounter medications on file as of 08/01/2021.  ? ? ?Follow-up: Return in about 3 months (around 11/01/2021) for CPE and labs.  ? ?PLAN ?Refill clobetasol ?Shingles and pneumovax given ?Refer to mwm and dietician ?Return in 3 mo for cpe, labs, shingles vaccine #2 ?Can consider GLP1 in the future  ?Patient encouraged to call clinic with any questions, comments, or concerns. ? ?Maximiano Coss, NP ? ?

## 2021-08-01 NOTE — Patient Instructions (Addendum)
Ms. Klenke -  ? ?Great to talk with you, ? ?Check out books by Merlinda Frederick on diet. He takes a good look at historically how Americans relate to diet and the influences that lead [place] there. The Omnivore's Dilemma and In [employer] of Food are good ones. Consider the [facility] by Jenel Lucks is another solid read.  ? ?See attached for info on healthy diet. It's a lot - no need to memorize, there won't be a test.  ? ?See you in around 3 mo for shingles shot #2, physical exam, and labs ? ?Thank you, ? ?Rich  ? ? ? ?If you have lab work done today you will be contacted with your lab results within the next 2 weeks.  If you have not heard from [place] then please contact us. The fastest way to get your results is to register for My Chart. ? ? ?IF you received an x-ray today, you will receive an invoice from [facility]. Please contact [facility] at [phone] with questions or concerns regarding your invoice.  ? ?IF you received labwork today, you will receive an invoice from [facility]. Please contact [facility] at [phone] with questions or concerns regarding your invoice.  ? ?Our billing staff will not be able to assist you with questions regarding bills from these companies. ? ?You will be contacted with the lab results as soon as they are available. The fastest way to get your results is to activate your My Chart account. Instructions are located on the last page of this paperwork. If you have not heard from [place] regarding the results in 2 weeks, please contact this office. ?  ? ? ?

## 2021-08-02 ENCOUNTER — Other Ambulatory Visit (HOSPITAL_COMMUNITY): Payer: Self-pay

## 2021-08-22 ENCOUNTER — Other Ambulatory Visit (HOSPITAL_COMMUNITY): Payer: Self-pay

## 2021-08-23 ENCOUNTER — Other Ambulatory Visit (HOSPITAL_COMMUNITY): Payer: Self-pay

## 2021-08-24 ENCOUNTER — Other Ambulatory Visit (HOSPITAL_COMMUNITY): Payer: Self-pay

## 2021-08-27 ENCOUNTER — Other Ambulatory Visit (HOSPITAL_COMMUNITY): Payer: Self-pay

## 2021-09-25 ENCOUNTER — Encounter: Payer: 59 | Attending: Registered Nurse | Admitting: Registered"

## 2021-09-25 ENCOUNTER — Encounter: Payer: Self-pay | Admitting: Registered"

## 2021-09-25 DIAGNOSIS — Z6841 Body Mass Index (BMI) 40.0 and over, adult: Secondary | ICD-10-CM | POA: Diagnosis not present

## 2021-09-25 DIAGNOSIS — E669 Obesity, unspecified: Secondary | ICD-10-CM

## 2021-09-25 NOTE — Patient Instructions (Signed)
-   Aim to increase fiber intake with whole grains, fruits, vegetables, and nuts.  ? ?- Balance meals with at least 3 of the 5 food groups.  ? ?- Balance snacks with carbohydrates + protein such as: ?Fruit and nuts ?Fruit and peanut butter ?Cheese and crackers ?Peanut butter and crackers ? ?- Aim to eat with husband. Prepare similar meals to create more time in your day.  ? ?- Continue to aim for joyful movement most days of the week.  ?

## 2021-09-25 NOTE — Progress Notes (Signed)
Medical Nutrition Therapy  ?Appointment Start time:  8:03  Appointment End time:  9:26 ? ?Primary concerns today: wants to do deliberate planning of meals + snacks for your work week  ?Referral diagnosis: obesity ?Preferred learning style: no preference indicated ?Learning readiness: ready, change in progress ? ? ?NUTRITION ASSESSMENT  ? ?Pt states her parents were heavy, they all lived to eat, a aprt of the "clean plate club", and had an athletic, muscular build when younger. States she has grown older, she is less active and has osteoarthritis. 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 Reports some back and neck pain. States she sits all day and has not pushed herself to exercise. States she has muscle cramps at times; drinking Gatorade to help. Reports cramps are due to inactivity and sudden activity.  ? ?States she wants to get down to a weight where she can be more mobile and modify her behaviors, slow and steady. Wants direction on good habits to incorporate. States she is a creature of habit and not a bariatric surgery person. States she and husband are exercsing together during the week. Reports she wants to be healthy in her retirement in a few years. States she has been on multiple diets: weight watchers, [facility] diet, etc and decided to stop in her 74s.  ? ?States she is a stress eater and will eat something to feel better. States she likes fruits and vegetables. States her husband is a picky eater and she cooks special meals for him, daily. States he doesn't like to eat out and she does. States she needs to learn how to cook things for herself and wants to be able to pack lunch + snacks ahead of time. States she typically eats breakfast at work between 8:30-9 am and she is usually starving at that time.  ? ? ?Clinical ?Medical Hx: hyperlipidemia, osteoarthritis ?Medications: See list ?Labs: See list ?Notable Signs/Symptoms: none ? ?Lifestyle &  Dietary Hx ? ? ?Estimated daily fluid intake: 76 oz ?Supplements: See list ?Sleep: 6-7 hrs/night ?Stress / self-care: eat ?Current average weekly physical activity: walking and biking 15 min, 6-7 days/week ? ?24-Hr Dietary Recall ?First Meal: 2 oats and honey granola bars + PB  ?Snack (11 am): cashews or cookies or nabs ?Second Meal: sandwich (ham, cheese or tuna) + chips  ?Snack: candy or chocolates ?Third Meal: frozen meal or cheese + crackers + ice cream ?Snack:  ?Beverages: water (2*20 oz + 8 oz; 48 oz), coffee with creamer (2*10 oz; 20 oz), Gatorade (8 oz); 76 oz ? ? ?NUTRITION DIAGNOSIS  ?NB-1.1 Food and nutrition-related knowledge deficit As related to prior exposure to incorrect information.  As evidenced by verbalizes incomplete information. ? ? ?NUTRITION INTERVENTION  ?Nutrition education (E-1) on the following topics:  ?Nutrition education and counseling. Pt was educated on the benefits of eating a variety of food groups at each meal. Discussed the purpose of each food group and ways to create balance with already established regimen. Discussed eating every 3-5 hours to help adequately nourish body and ways to increase water intake. Discussed importance of physical activity. Pt agreed with goals listed.  ? ?Handouts Provided Include  ?Start Simple with MyPlate ? ?Learning Style & Readiness for Change ?Teaching method utilized: Visual & Auditory  ?Demonstrated degree of understanding via: Teach Back  ?Barriers to learning/adherence to lifestyle change: work-life balance ? ?Goals Established by Pt ?Aim to increase fiber intake with whole grains, fruits, vegetables, and  nuts.  ?Balance meals with at least 3 of the 5 food groups.  ?Balance snacks with carbohydrates + protein such as: ?Fruit and nuts ?Fruit and peanut butter ?Cheese and crackers ?Peanut butter and crackers ?Aim to eat with husband. Prepare similar meals to create more time in your day.  ?Continue to aim for joyful movement most days of the  week.  ? ? ?MONITORING & EVALUATION ?Dietary intake, weekly physical activity. ? ?Next Steps  ?Patient is to follow-up in 2 months. ?

## 2021-11-05 ENCOUNTER — Encounter: Payer: 59 | Admitting: Registered Nurse

## 2021-11-12 ENCOUNTER — Encounter: Payer: Self-pay | Admitting: Registered Nurse

## 2021-11-12 ENCOUNTER — Ambulatory Visit (INDEPENDENT_AMBULATORY_CARE_PROVIDER_SITE_OTHER): Payer: 59 | Admitting: Registered Nurse

## 2021-11-12 VITALS — BP 150/84 | HR 76 | Temp 98.3°F | Resp 18 | Ht 62.5 in | Wt 272.4 lb

## 2021-11-12 DIAGNOSIS — M79643 Pain in unspecified hand: Secondary | ICD-10-CM | POA: Diagnosis not present

## 2021-11-12 DIAGNOSIS — Z23 Encounter for immunization: Secondary | ICD-10-CM

## 2021-11-12 DIAGNOSIS — R011 Cardiac murmur, unspecified: Secondary | ICD-10-CM | POA: Diagnosis not present

## 2021-11-12 DIAGNOSIS — Z0000 Encounter for general adult medical examination without abnormal findings: Secondary | ICD-10-CM | POA: Diagnosis not present

## 2021-11-12 DIAGNOSIS — G8929 Other chronic pain: Secondary | ICD-10-CM | POA: Diagnosis not present

## 2021-11-12 DIAGNOSIS — K429 Umbilical hernia without obstruction or gangrene: Secondary | ICD-10-CM

## 2021-11-12 HISTORY — DX: Cardiac murmur, unspecified: R01.1

## 2021-11-12 LAB — TSH: TSH: 0.99 u[IU]/mL (ref 0.35–5.50)

## 2021-11-12 LAB — COMPREHENSIVE METABOLIC PANEL
ALT: 17 U/L (ref 0–35)
AST: 16 U/L (ref 0–37)
Albumin: 4.1 g/dL (ref 3.5–5.2)
Alkaline Phosphatase: 54 U/L (ref 39–117)
BUN: 17 mg/dL (ref 6–23)
CO2: 27 mEq/L (ref 19–32)
Calcium: 9.6 mg/dL (ref 8.4–10.5)
Chloride: 103 mEq/L (ref 96–112)
Creatinine, Ser: 0.71 mg/dL (ref 0.40–1.20)
GFR: 86.79 mL/min (ref >60.00)
Glucose, Bld: 95 mg/dL (ref 70–99)
Potassium: 4.4 mEq/L (ref 3.5–5.1)
Sodium: 139 mEq/L (ref 135–145)
Total Bilirubin: 0.7 mg/dL (ref 0.2–1.2)
Total Protein: 6.7 g/dL (ref 6.0–8.3)

## 2021-11-12 LAB — LIPID PANEL
Cholesterol: 199 mg/dL (ref 0–200)
HDL: 48.9 mg/dL (ref >39.00)
LDL Cholesterol: 129 mg/dL — ABNORMAL HIGH (ref 0–99)
NonHDL: 150.35
Total CHOL/HDL Ratio: 4
Triglycerides: 105 mg/dL (ref 0.0–149.0)
VLDL: 21 mg/dL (ref 0.0–40.0)

## 2021-11-12 LAB — CBC WITH DIFFERENTIAL/PLATELET
Basophils Absolute: 0.1 10*3/uL (ref 0.0–0.1)
Basophils Relative: 1.1 % (ref 0.0–3.0)
Eosinophils Absolute: 0.3 10*3/uL (ref 0.0–0.7)
Eosinophils Relative: 4.6 % (ref 0.0–5.0)
HCT: 40.8 % (ref 36.0–46.0)
Hemoglobin: 13.3 g/dL (ref 12.0–15.0)
Lymphocytes Relative: 26.3 % (ref 12.0–46.0)
Lymphs Abs: 1.4 10*3/uL (ref 0.7–4.0)
MCHC: 32.5 g/dL (ref 30.0–36.0)
MCV: 90.2 fl (ref 78.0–100.0)
Monocytes Absolute: 0.5 10*3/uL (ref 0.1–1.0)
Monocytes Relative: 9 % (ref 3.0–12.0)
Neutro Abs: 3.2 10*3/uL (ref 1.4–7.7)
Neutrophils Relative %: 59 % (ref 43.0–77.0)
Platelets: 143 10*3/uL — ABNORMAL LOW (ref 150.0–400.0)
RBC: 4.53 Mil/uL (ref 3.87–5.11)
RDW: 13.8 % (ref 11.5–15.5)
WBC: 5.4 10*3/uL (ref 4.0–10.5)

## 2021-11-12 LAB — HEMOGLOBIN A1C: Hgb A1c MFr Bld: 6 % (ref 4.6–6.5)

## 2021-11-12 NOTE — Assessment & Plan Note (Signed)
Carpal tunnel or other local MSK issue. Will refer to hand specialist. Advised on nonpharm care.

## 2021-11-12 NOTE — Assessment & Plan Note (Signed)
Per pt has been ongoing for some time. Mild, grade 1 or 2. No CV symptoms with activity. Will continue to closely monitor. Defer cardiology referral at this time.

## 2021-11-27 ENCOUNTER — Encounter: Payer: Self-pay | Admitting: *Deleted

## 2021-11-28 ENCOUNTER — Encounter: Payer: Self-pay | Admitting: *Deleted

## 2021-12-03 ENCOUNTER — Ambulatory Visit (INDEPENDENT_AMBULATORY_CARE_PROVIDER_SITE_OTHER): Payer: 59 | Admitting: Orthopedic Surgery

## 2021-12-03 ENCOUNTER — Ambulatory Visit (INDEPENDENT_AMBULATORY_CARE_PROVIDER_SITE_OTHER): Payer: 59

## 2021-12-03 ENCOUNTER — Ambulatory Visit: Payer: Self-pay

## 2021-12-03 ENCOUNTER — Other Ambulatory Visit (HOSPITAL_COMMUNITY): Payer: Self-pay

## 2021-12-03 DIAGNOSIS — M79641 Pain in right hand: Secondary | ICD-10-CM

## 2021-12-03 DIAGNOSIS — M79642 Pain in left hand: Secondary | ICD-10-CM | POA: Diagnosis not present

## 2021-12-03 DIAGNOSIS — M19049 Primary osteoarthritis, unspecified hand: Secondary | ICD-10-CM | POA: Diagnosis not present

## 2021-12-03 MED ORDER — MELOXICAM 7.5 MG PO TABS
7.5000 mg | ORAL_TABLET | Freq: Every day | ORAL | 0 refills | Status: AC
  Filled 2021-12-03: qty 30, 30d supply, fill #0

## 2021-12-03 NOTE — Progress Notes (Signed)
Office Visit Note   Patient: Charlene Tapia           Date of Birth: 01-16-1951           MRN: [id number] Visit Date: 12/03/2021              Requested by: Maximiano Coss, NP [address] [place],  [place] [address] PCP: Maximiano Coss, NP   Assessment & Plan: Visit Diagnoses:  1. Pain in both hands   2. Hand arthritis     Plan: Discussed with patient that she has scattered osteoarthritic changes throughout both of her hands.  Her biggest issue seems to be her bilateral middle fingers.  She describes some subjective instability with use that she does have some very mild instability at the PIP joint on exam and approximately 30 degrees of flexion but no instability and full flexion and full extension.  We will try a stronger anti-inflammatory for her generalized osteoarthritic pain.  She is interested in trying hand therapy to work on overall hand strengthening and conditioning.  I will refer her to hand therapy and I can see her back again as needed.  Follow-Up Instructions: No follow-ups on file.   Orders:  Orders Placed This Encounter  Procedures   XR Hand Complete Left   XR Hand Complete Right   No orders of the defined types were placed in this encounter.     Procedures: No procedures performed   Clinical Data: No additional findings.   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  Is been going on for quite some time now.  She has pain throughout both of her hands that she attributes to osteoarthritis.  She has known arthritis of both thumb CMC joints.  Her biggest issue is bilateral middle finger pain.  She notes that when she lifts something very heavy her middle finger feels unstable and very painful.  The pain is intermittent and only with the specific activity of lifting something heavy.  She  types a lot at work and has no pain with her daily work activities.  She denies any previous injury to either middle fingers.  She will occasionally have some triggering but this is very rare and not bothersome for her.    Review of Systems   Objective: Vital Signs: There were no vitals taken for this visit.  Physical Exam Constitutional:      Appearance: Normal appearance.  Cardiovascular:     Rate and Rhythm: Normal rate.     Pulses: Normal pulses.  Pulmonary:     Effort: Pulmonary effort is normal.  Skin:    General: Skin is warm and dry.     Capillary Refill: Capillary refill takes less than 2 seconds.  Neurological:     Mental Status: She is alert.     Right Hand Exam   Tenderness  The patient is experiencing no tenderness.   Other  Erythema: absent Sensation: normal Pulse: present  Comments:  Pain and crepitus with thumb CMC grind test.  No obvious instability at the MP, PIP, or DIP joints of the middle finger.  Able to make a full fist and fully extend all fingers.    Left Hand Exam   Tenderness  The patient is experiencing no tenderness.   Other  Erythema:  absent Sensation: normal Pulse: present  Comments:  Pain and crepitus with thumb CMC grind test.  Mild instability at the middle finger PIP joint w/ 30 deg of flexion but no instability in full extension or full flexion.  No instability at the MP and DIP joints.  No triggering.  Able to make a full fist and fully extend all fingers.       Specialty Comments:  No specialty comments available.  Imaging: No results found.   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   TROCHANTERIC BURSITIS, RIGHT 12/09/2008   Past Medical History:  Diagnosis Date   Allergy    Heart murmur 11/12/2021   High cholesterol    Osteoarthritis    "knees; thumbs" (10/01/2013)    Family History  Problem Relation Age of Onset   Heart disease Father    Hypertension Father    Alcohol abuse Father     Past Surgical History:  Procedure Laterality Date   GANGLION CYST EXCISION Right 01/18/1989   INGUINAL HERNIA REPAIR Bilateral 1954   "age 33 month"   JOINT REPLACEMENT     TOTAL KNEE ARTHROPLASTY Left 08/16/2013   Procedure: TOTAL KNEE ARTHROPLASTY;  Surgeon: Kerin Salen, MD;  Location: [place];  Service: Orthopedics;  Laterality: Left;   TOTAL KNEE ARTHROPLASTY Right 09/29/2013   TOTAL KNEE ARTHROPLASTY Right 09/29/2013   Procedure: TOTAL KNEE ARTHROPLASTY;  Surgeon: Kerin Salen, MD;  Location: [place];  Service: Orthopedics;  Laterality: Right;   Social History   Occupational History   Not on file  Tobacco Use   Smoking status: Never   Smokeless tobacco: Never  Substance and Sexual Activity   Alcohol use: Yes    Comment: Social drinking wine only   Drug use: No   Sexual activity: Not Currently    Comment: 5 years of age/spouse w/ ED

## 2021-12-04 ENCOUNTER — Other Ambulatory Visit (HOSPITAL_COMMUNITY): Payer: Self-pay

## 2021-12-05 ENCOUNTER — Encounter: Payer: Self-pay | Admitting: Registered"

## 2021-12-05 ENCOUNTER — Encounter: Payer: 59 | Attending: Registered Nurse | Admitting: Registered"

## 2021-12-05 DIAGNOSIS — Z713 Dietary counseling and surveillance: Secondary | ICD-10-CM | POA: Diagnosis not present

## 2021-12-05 NOTE — Progress Notes (Signed)
Medical Nutrition Therapy  Appointment Start time:  8:03  Appointment End time:  8:30  Primary concerns today: wants to do deliberate planning of meals + snacks for your work week  Referral diagnosis: obesity Preferred learning style: no preference indicated Learning readiness: ready, change in progress   NUTRITION ASSESSMENT   Pt states she had recent PCP visit and labs are heading in the right direction; LDL Chol has decreased from 157 to 129.  States she has been eating more fruit now than before rather than "junk" food. States she has been taking nuts as snacks as well. States she has been reading labels for fiber content. States she has been doing easy changes and its getting easier all of the time. States she is not depriving herself or dieting and noticed she has lost 6-7 lbs. States she has reduced getting take-out and will eat at home instead. States she has tried to slow down with her pace when eating. States she has been listening to her body and will pack leftovers if she is full and hasn't completed her meal. Overall, she has tried to make more mindful choices.   States she started riding her bike but stopped when the weather warmed up. States her bike is located in an exercise building and has not been motivated to do it because the walk to the building is hot. States she parks further at work and does a lot of walking while at work.    Clinical Medical Hx: hyperlipidemia, osteoarthritis Medications: See list Labs: See list Notable Signs/Symptoms: none  Lifestyle & Dietary Hx   Estimated daily fluid intake: 76 oz Supplements: See list Sleep: 6-7 hrs/night Stress / self-care: eat Current average weekly physical activity: none reported  24-Hr Dietary Recall First Meal: granola bar + banana + cashews   Snack (11 am):  Second Meal: sandwich (ham, cheese on whole wheat with mustard) + chips  Snack: applesauce cup + nuts Third Meal: frozen meal (chicken, vegetables)   Snack: blueberries + small amount of ice cream Beverages: water (2*20 oz + 8 oz; 48 oz), coffee with creamer (2*10 oz; 20 oz), Powerade zero (8 oz); 76 oz   NUTRITION DIAGNOSIS  NB-1.1 Food and nutrition-related knowledge deficit As related to prior exposure to incorrect information.  As evidenced by verbalizes incomplete information.   NUTRITION INTERVENTION  Nutrition education (E-1) on the following topics:  Nutrition education and counseling. Pt was encouraged with increasing fiber intake with fruit, vegetables, nuts, and whole grains. Discussed importance of physical activity and ways to reintroduce it into her weekly regimen. Pt agreed with goals listed.   Handouts Provided Include  none  Learning Style & Readiness for Change Teaching method utilized: Visual & Auditory  Demonstrated degree of understanding via: Teach Back  Barriers to learning/adherence to lifestyle change: work-life balance  Goals Established by Pt Aim to have ride bike at least once a week. Pick a day on the weekend for 15 minutes.  Continue to keep up the great work on making plans to eat with husband.    MONITORING & EVALUATION Dietary intake, weekly physical activity.  Next Steps  Patient is to follow-up in 6 months after next PCP appt and rechecking of labs.

## 2021-12-05 NOTE — Patient Instructions (Signed)
-   Aim to have ride bike at least once a week. Pick a day on the weekend for 15 minutes.   - Continue to keep up the great work on making plans to eat with husband.

## 2021-12-08 ENCOUNTER — Ambulatory Visit
Admission: RE | Admit: 2021-12-08 | Discharge: 2021-12-08 | Disposition: A | Discharge status: Home / Self Care | Payer: 59 | Source: Ambulatory Visit | Attending: Nurse Practitioner | Admitting: Nurse Practitioner

## 2021-12-08 VITALS — BP 179/71 | HR 75 | Temp 97.9°F | Resp 20

## 2021-12-08 DIAGNOSIS — S20462A Insect bite (nonvenomous) of left back wall of thorax, initial encounter: Secondary | ICD-10-CM | POA: Diagnosis not present

## 2021-12-08 DIAGNOSIS — W57XXXA Bitten or stung by nonvenomous insect and other nonvenomous arthropods, initial encounter: Secondary | ICD-10-CM

## 2021-12-08 MED ORDER — DOXYCYCLINE HYCLATE 100 MG PO TABS: 100.0000 mg | ORAL_TABLET | Freq: Two times a day (BID) | ORAL | 0 refills | Status: DC

## 2021-12-08 NOTE — Discharge Instructions (Signed)
Take medication as prescribed. Warm compresses to the affected area 3-4 times daily. Clean the area at least twice daily with Dial Gold bar soap or another type of antibacterial soap. Keep the area covered while it is draining. Go to the emergency department if you develop fever, chills, generalized fatigue, nausea, vomiting, or if the area of redness spreads into the genital region, or if you have foul-smelling drainage.  Follow-up in this clinic if symptoms do not improve.

## 2021-12-08 NOTE — ED Provider Notes (Signed)
[facility]    CSN: [id number] Arrival date & time: 12/08/21  1350      History   Chief Complaint Chief Complaint  Patient presents with   Insect Bite    Monday 11/26/21 suspected spider bite on left side of back bra area; still not healed seems to be draining now. - Entered by patient    HPI Charlene Tapia is a 71 y.o. female.   HPI  Patient presents after a possible insect bite that occurred on 11/26/2021.  Patient states that the area occurred on the left side of her back under her bra line.  She states that she never saw any kind of insect, but cannot determine what else may have caused her symptoms.  She states the area became very painful and red.  She states that the area did start draining over the last several days, but she continues to have an area of tenderness to the site.  She states the area has been draining blood-tinged fluid.  She denies fever, chills, chest pain, abdominal pain, nausea, vomiting, or diarrhea.  Patient has been applying over-the-counter Neosporin and keeping the area covered with a bandage.  She was concerned because she feels that the area she did have been resolved by now.  Past Medical History:  Diagnosis Date   Allergy    Heart murmur 11/12/2021   High cholesterol    Osteoarthritis    "knees; thumbs" (10/01/2013)    Patient Active Problem List   Diagnosis Date Noted   Hand arthritis 12/03/2021   Chronic hand pain 66/10/3014   Umbilical hernia without obstruction and without gangrene 11/12/2021   Heart murmur 11/12/2021   Need for shingles vaccine 11/12/2021   Arthritis of right knee 09/29/2013   Arthritis of left knee 08/16/2013   Arthritis of knee, left 08/15/2013   Degenerative arthritis of left knee 05/04/2013   Obesity, morbid, BMI 50 or higher ([place]) 05/04/2013   Left knee pain 11/10/2012   Trigger middle finger of right hand 10/31/2010   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    TROCHANTERIC BURSITIS, RIGHT 12/09/2008    Past Surgical History:  Procedure Laterality Date   GANGLION CYST EXCISION Right 01/18/1989   INGUINAL HERNIA REPAIR Bilateral 1954   "age 16 month"   JOINT REPLACEMENT     TOTAL KNEE ARTHROPLASTY Left 08/16/2013   Procedure: TOTAL KNEE ARTHROPLASTY;  Surgeon: Kerin Salen, MD;  Location: [place];  Service: Orthopedics;  Laterality: Left;   TOTAL KNEE ARTHROPLASTY Right 09/29/2013   TOTAL KNEE ARTHROPLASTY Right 09/29/2013   Procedure: TOTAL KNEE ARTHROPLASTY;  Surgeon: Kerin Salen, MD;  Location: [place];  Service: Orthopedics;  Laterality: Right;    OB History   No obstetric history on file.      Home Medications    Prior to Admission medications   Medication Sig Start Date End Date Taking? Authorizing Provider  doxycycline (VIBRA-TABS) 100 MG tablet Take 1 tablet (100 mg total) by mouth 2 (two) times daily. 12/08/21  Yes Ronell Boldin-Warren, Alda Lea, NP  clobetasol ointment (TEMOVATE) 0.10 % 1 application to affected area Externally once a day M-F prn 06/26/16   [provider]  Clobetasol Prop Emollient Base (CLOBETASOL PROPIONATE E) 0.05 % emollient cream Apply to affected area 2 (two) times daily. 08/01/21   Maximiano Coss, NP  fluticasone Asencion Islam) 50 MCG/ACT nasal spray Place 2 sprays into both nostrils daily as needed for allergies or rhinitis.  [provider]  ibuprofen (ADVIL) 200 MG tablet Take 200 mg by mouth every 6 (six) hours as needed.    [provider]  meloxicam (MOBIC) 7.5 MG tablet Take 1 tablet (7.5 mg total) by mouth daily. 12/03/21 01/04/22  Sherilyn Cooter, MD    Family History Family History  Problem Relation Age of Onset   Heart disease Father    Hypertension Father    Alcohol abuse Father     Social History Social History   Tobacco Use   Smoking status: Never   Smokeless tobacco: Never  Substance Use Topics   Alcohol use: Yes    Comment: Social drinking wine only   Drug use:  No     Allergies   Phisohex [hexachlorophene]   Review of Systems Review of Systems Per HPI  Physical Exam Triage Vital Signs ED Triage Vitals  Enc Vitals Group     BP 12/08/21 1417 (!) 179/71     Pulse Rate 12/08/21 1417 75     Resp 12/08/21 1417 20     Temp 12/08/21 1417 97.9 F (36.6 C)     Temp Source 12/08/21 1417 Oral     SpO2 12/08/21 1417 95 %     Weight --      Height --      Head Circumference --      Peak Flow --      Pain Score 12/08/21 1419 1     Pain Loc --      Pain Edu? --      Excl. in [place]? --    No data found.  Updated Vital Signs BP (!) 179/71 (BP Location: Right Wrist)   Pulse 75   Temp 97.9 F (36.6 C) (Oral)   Resp 20   SpO2 95%   Visual Acuity Right Eye Distance:   Left Eye Distance:   Bilateral Distance:    Right Eye Near:   Left Eye Near:    Bilateral Near:     Physical Exam Vitals and nursing note reviewed.  Constitutional:      General: She is not in acute distress.    Appearance: Normal appearance.  HENT:     Head: Normocephalic.  Eyes:     Extraocular Movements: Extraocular movements intact.     Pupils: Pupils are equal, round, and reactive to light.  Pulmonary:     Effort: Pulmonary effort is normal.  Skin:    General: Skin is warm and dry.     Findings: Abscess present.          Comments: Induration noted to the left thoracic region of the patient's back.  Area of firmness is noted that measures approximately 2 cm x 2 cm.  Core of the induration has white pus, but cannot expectorate with applying pressure.  No fluctuance is present.  Neurological:     General: No focal deficit present.     Mental Status: She is alert and oriented to person, place, and time.  Psychiatric:        Mood and Affect: Mood normal.        Behavior: Behavior normal.      UC Treatments / Results  Labs (all labs ordered are listed, but only abnormal results are displayed) Labs Reviewed - No data to display  EKG   Radiology No  results found.  Procedures Procedures (including critical care time)  Medications Ordered in UC Medications - No data to display  Initial Impression / Assessment and Plan /  UC Course  I have reviewed the triage vital signs and the nursing notes.  Pertinent labs & imaging results that were available during my care of the patient were reviewed by me and considered in my medical decision making (see chart for details).  Patient presents for possible insect bite to the left side of her back.  On exam, area appears to be an abscess as it is currently draining.  2 cm x 2 cm induration noted to the left thoracic region.  Cannot expectorate any additional drainage.  There is no fluctuance present, unable to perform I&D.  We will start patient on doxycycline.  Supportive care recommendations were also provided to the patient with strict indications of when to go to the emergency department. Final Clinical Impressions(s) / UC Diagnoses   Final diagnoses:  Insect bite of left back wall of thorax, initial encounter     Discharge Instructions      Take medication as prescribed. Warm compresses to the affected area 3-4 times daily. Clean the area at least twice daily with Dial Gold bar soap or another type of antibacterial soap. Keep the area covered while it is draining. Go to the emergency department if you develop fever, chills, generalized fatigue, nausea, vomiting, or if the area of redness spreads into the genital region, or if you have foul-smelling drainage.  Follow-up in this clinic if symptoms do not improve.     ED Prescriptions     Medication Sig Dispense Auth. Provider   doxycycline (VIBRA-TABS) 100 MG tablet Take 1 tablet (100 mg total) by mouth 2 (two) times daily. 20 tablet Bernard Donahoo-Warren, Alda Lea, NP      PDMP not reviewed this encounter.   Tish Men, NP 12/08/21 1444

## 2021-12-08 NOTE — ED Triage Notes (Signed)
Pt reports she suspect she was bit by a spider on left sided middle back on  11/26/21. Reports are I swelling, red and has some drainage x 2 days.

## 2022-01-08 ENCOUNTER — Ambulatory Visit: Payer: 59 | Admitting: Rehabilitative and Restorative Service Providers"

## 2022-01-08 ENCOUNTER — Encounter: Payer: Self-pay | Admitting: Rehabilitative and Restorative Service Providers"

## 2022-01-08 ENCOUNTER — Other Ambulatory Visit: Payer: Self-pay

## 2022-01-08 DIAGNOSIS — M79642 Pain in left hand: Secondary | ICD-10-CM | POA: Diagnosis not present

## 2022-01-08 DIAGNOSIS — M6281 Muscle weakness (generalized): Secondary | ICD-10-CM | POA: Diagnosis not present

## 2022-01-08 DIAGNOSIS — M79641 Pain in right hand: Secondary | ICD-10-CM

## 2022-01-08 DIAGNOSIS — M25642 Stiffness of left hand, not elsewhere classified: Secondary | ICD-10-CM

## 2022-01-08 NOTE — Therapy (Signed)
OUTPATIENT OCCUPATIONAL THERAPY ORTHO EVALUATION  Patient Name: Charlene Tapia MRN: [id number] DOB:08-22-1950, 71 y.o., female Today's Date: 01/08/2022  PCP: Maximiano Coss, NP REFERRING PROVIDER: Sherilyn Cooter, MD   OT End of Session - 01/08/22 1019     Visit Number 1    Number of Visits 12    Date for OT Re-Evaluation 02/08/22    Authorization Type [facility]    OT Start Time 1019    OT Stop Time 1127    OT Time Calculation (min) 68 min    Activity Tolerance Patient tolerated treatment well;No increased pain    Behavior During Therapy [facility] for tasks assessed/performed             Past Medical History:  Diagnosis Date   Allergy    Heart murmur 11/12/2021   High cholesterol    Osteoarthritis    "knees; thumbs" (10/01/2013)   Past Surgical History:  Procedure Laterality Date   GANGLION CYST EXCISION Right 01/18/1989   INGUINAL HERNIA REPAIR Bilateral 1954   "age 32 month"   JOINT REPLACEMENT     TOTAL KNEE ARTHROPLASTY Left 08/16/2013   Procedure: TOTAL KNEE ARTHROPLASTY;  Surgeon: Kerin Salen, MD;  Location: [place];  Service: Orthopedics;  Laterality: Left;   TOTAL KNEE ARTHROPLASTY Right 09/29/2013   TOTAL KNEE ARTHROPLASTY Right 09/29/2013   Procedure: TOTAL KNEE ARTHROPLASTY;  Surgeon: Kerin Salen, MD;  Location: [place];  Service: Orthopedics;  Laterality: Right;   Patient Active Problem List   Diagnosis Date Noted   Hand arthritis 12/03/2021   Chronic hand pain 74/16/3845   Umbilical hernia without obstruction and without gangrene 11/12/2021   Heart murmur 11/12/2021   Need for shingles vaccine 11/12/2021   Arthritis of right knee 09/29/2013   Arthritis of left knee 08/16/2013   Arthritis of knee, left 08/15/2013   Degenerative arthritis of left knee 05/04/2013   Obesity, morbid, BMI 50 or higher ([place]) 05/04/2013   Left knee pain 11/10/2012   Trigger middle finger of right hand 10/31/2010   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   CONGENITAL PES  PLANUS 01/27/2009   TROCHANTERIC BURSITIS, RIGHT 12/09/2008    ONSET DATE: chronic, but worse in this year (2023)   REFERRING DIAG: M79.641,M79.642 (ICD-10-CM) - Pain in both hands  THERAPY DIAG:  Stiffness of left hand, not elsewhere classified - Plan: Ot plan of care cert/re-cert  Muscle weakness (generalized) - Plan: Ot plan of care cert/re-cert  Pain in right hand - Plan: Ot plan of care cert/re-cert  Pain in left hand - Plan: Ot plan of care cert/re-cert  Rationale for Evaluation and Treatment Rehabilitation  SUBJECTIVE:   SUBJECTIVE STATEMENT: She works in data entry (typing), has hx of b/l thumb CMC J arthritis, presents with main complaint right MF pain, instability @ 30* flex ad rare triggering. 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 She states when tossing brush the other day, she got a sharp, excruciating pain in left MF that lasted for hours. She does have hx of trigger finger in this finger and contralateral MF as well. She states "for years" she will also have numbness in b/l hands if they stay in one position too long (driving, data entry, etc.).    PERTINENT HISTORY: Per MD: "Bilateral hand pain.  Subjective instability at middle finger PIP joint.  Wants to work on overall hand strengthening and conditioning."   PRECAUTIONS: None  WEIGHT BEARING RESTRICTIONS No  PAIN:  Are you having pain? Yes Rating: 2/10 at rest now mild arthritis pain, up to 3/10 in past week in b/l hands  FALLS: Has patient fallen in last 6 months? No  LIVING ENVIRONMENT: Lives with: lives with their spouse  PLOF: Independent  PATIENT GOALS Gain knowledge to prevent problems/pain in both hands, as she was worried about doing major damage to hands when they are painful.    OBJECTIVE:   HAND DOMINANCE: Right   ADLs: Overall ADLs: States decreased ability to grab, hold household objects, pain and inability to open  containers, perform all FMS tasks, etc.    FUNCTIONAL OUTCOME MEASURES: Eval: Quck DASH 27% impairment today   UPPER EXTREMITY ROM     Active ROM Right eval Left eval  Wrist flexion 61 71  Wrist extension 59 54  Wrist ulnar deviation    Wrist radial deviation    Wrist pronation 85 85  Wrist supination 75 75  (Blank rows = not tested)  Active ROM Right eval Left eval  Thumb MCP (0-60)    Thumb IP (0-80)    Thumb Radial abd/add (0-55)     Thumb Palmar abd/add (0-45)     Thumb Opposition to Small Finger Full but tight to base (1cm gap) Full but tight to base (1cm gap)    Index MCP (0-90)     Index PIP (0-100)     Index DIP (0-70)      Long MCP (0-90)      Long PIP (0-100)      Long DIP (0-70)      Ring MCP (0-90)  77 73   Ring PIP (0-100)  95 87   Ring DIP (0-70)  80 80   Little MCP (0-90)      Little PIP (0-100)      Little DIP (0-70)      (Blank rows = not tested)   UPPER EXTREMITY MMT:    Eval: good strength b/l elbow, wrists, hands today (at least 4+/5 or better for all) without pain   HAND FUNCTION: Eval: Grip strength Right: 43 lbs, Left: 42 lbs; 11# b/l palmar pinch  COORDINATION: Eval: other than tightness in hands GMS is WFL; FMS slightly diminished (observed) due to tightness   SENSATION: Eval:  Light touch intact today, though intermittent numbness reported with prolonged postures/gripping   EDEMA:   Eval: Mild-moderate swelling in both arms/hands, etc. This seems to be her baseline.   OBSERVATIONS:   Eval: She has significant intrinsic tightness present in both hands when tested; has no TTP around MF or left hand palmar or dorsal; no laxity/instability noted to left MF today; b/l wrists are tight and she does have mild-moderate swelling of hands/arms; she does seems to describe mechanism for triggering finger when describing infrequent sharp pains    TODAY'S TREATMENT:  Eval: OT provided a lot of education on adapting tasks, modifying  environment, and pacing activities. She states already doing some of these things, which is positive. She was recommended to increase grip size of silverware and tools, if possible, and to be more self-aware of body issues when problems happen (OT coaches her what to watch for in case of triggering MF symptoms).  OT also gives out initial HEP for wrist, thumb and intrinsic hand tightness as below. She tolerates well, demo's back and will perform for a week before returning to check for any new issues/changes in status, etc.   Exercises - Seated Wrist Flexion with Overpressure  -  3-4 x daily - 3-5 reps - 15 sec hold - Wrist Prayer Stretch  - 3-4 x daily - 3-5 reps - 15 sec hold - Seated Thumb MP Flexion PROM  - 4-6 x daily - 1 sets - 10-15 reps - Seated Single Digit Intrinsic Stretch  - 4-6 x daily - 3-5 reps - 15-20 sec hold   PATIENT EDUCATION: Education details: See tx section above for details  Person educated: Patient Education method: Verbal Instruction, Teach back, Handouts  Education comprehension: States and demonstrates understanding, Additional Education required    HOME EXERCISE PROGRAM: Access Code: [email or web address] URL: [email or web address] Date: 01/08/2022 Prepared by: Benito Mccreedy  GOALS: Goals reviewed with patient? Yes   SHORT TERM GOALS: (STG required if POC>30 days)  Pt will obtain protective, custom orthotic. Target date: TBD PRN Goal status: INITIAL  2.  Pt will demo/state understanding of initial HEP to improve pain levels and prerequisite motion. Target date: 01/08/22 Goal status: MET   LONG TERM GOALS:  Pt will improve functional ability by decreased impairment per Quick DASH assessment from 27% to 20% or better, for better quality of life. Target date: 02/08/22 Goal status: INITIAL  2.  Pt will improve A/ROM in b/l wrist ext to at least 65* each, to have functional motion for tasks like reach and grasp, less tightness.  Target date:  02/08/22 Goal status: INITIAL  4.  Pt will improve A/ROM in left MF TAM from 240* to at least 245* with less intrinsic tightness with Bunnell Test, to have better grasp, less tightness.  Target date: 02/08/22 Goal status: INITIAL    ASSESSMENT:  CLINICAL IMPRESSION: Patient is a 71 y.o. female who was seen today for occupational therapy evaluation for left hand MF pain, b/l hand tightness and decreased ability. She will benefit from brief period of OP OT.    PERFORMANCE DEFICITS in functional skills including IADLs, dexterity, edema, pain, fascial restrictions, flexibility, FMC, body mechanics, endurance, and UE functional use, cognitive skills including safety awareness, and psychosocial skills including coping strategies, environmental adaptation, and habits.   IMPAIRMENTS are limiting patient from IADLs, work, and yard and home care tasks .   COMORBIDITIES has co-morbidities such as OA, obesity, pes planus, knee and neck pain/issues, and more  that affects occupational performance. Patient will benefit from skilled OT to address above impairments and improve overall function.  MODIFICATION OR ASSISTANCE TO COMPLETE EVALUATION: Min-Moderate modification of tasks or assist with assess necessary to complete an evaluation.  OT OCCUPATIONAL PROFILE AND HISTORY: Detailed assessment: Review of records and additional review of physical, cognitive, psychosocial history related to current functional performance.  CLINICAL DECISION MAKING: LOW - limited treatment options, no task modification necessary  REHAB POTENTIAL: Good  EVALUATION COMPLEXITY: Low      PLAN: OT FREQUENCY: 1-2x/week  OT DURATION: 4 weeks (through 02/08/22)  PLANNED INTERVENTIONS: self care/ADL training, therapeutic exercise, therapeutic activity, manual therapy, passive range of motion, splinting, fluidotherapy, moist heat, cryotherapy, contrast bath, patient/family education, and coping strategies  training  RECOMMENDED OTHER SERVICES: none now   CONSULTED AND AGREED WITH PLAN OF CARE: Patient  PLAN FOR NEXT SESSION: check on motion, strength, symptoms and HEP follow through   [employer], OTR/L, CHT 01/08/2022, 11:50 AM

## 2022-01-16 ENCOUNTER — Encounter: Payer: 59 | Admitting: Rehabilitative and Restorative Service Providers"

## 2022-01-17 ENCOUNTER — Ambulatory Visit (INDEPENDENT_AMBULATORY_CARE_PROVIDER_SITE_OTHER): Payer: 59 | Admitting: General Surgery

## 2022-01-17 ENCOUNTER — Encounter: Payer: Self-pay | Admitting: General Surgery

## 2022-01-17 VITALS — BP 177/84 | HR 71 | Temp 98.4°F | Resp 16 | Ht 62.5 in | Wt 275.0 lb

## 2022-01-17 DIAGNOSIS — K439 Ventral hernia without obstruction or gangrene: Secondary | ICD-10-CM | POA: Diagnosis not present

## 2022-01-17 NOTE — Progress Notes (Signed)
Charlene Tapia; [id number]; [birth year]   HPI Patient is a 71 year old white female who was referred to my care by Charlene Tapia for evaluation treatment of an umbilical hernia.  Patient states she has had an umbilical hernia for many years.  She has never had surgery in this region.  It has gotten a little bit larger in size.  She currently has no nausea, vomiting, or periumbilical pain.  The umbilicus has never been infected. Past Medical History:  Diagnosis Date   Allergy    Heart murmur 11/12/2021   High cholesterol    Osteoarthritis    "knees; thumbs" (10/01/2013)    Past Surgical History:  Procedure Laterality Date   GANGLION CYST EXCISION Right 01/18/1989   INGUINAL HERNIA REPAIR Bilateral 1954   "age 85 month"   JOINT REPLACEMENT     TOTAL KNEE ARTHROPLASTY Left 08/16/2013   Procedure: TOTAL KNEE ARTHROPLASTY;  Surgeon: Charlene Salen, MD;  Location: [facility];  Service: Orthopedics;  Laterality: Left;   TOTAL KNEE ARTHROPLASTY Right 09/29/2013   TOTAL KNEE ARTHROPLASTY Right 09/29/2013   Procedure: TOTAL KNEE ARTHROPLASTY;  Surgeon: Charlene Salen, MD;  Location: [place];  Service: Orthopedics;  Laterality: Right;    Family History  Problem Relation Age of Onset   Heart disease Father    Hypertension Father    Alcohol abuse Father     Current Outpatient Medications on File Prior to Visit  Medication Sig Dispense Refill   clobetasol ointment (TEMOVATE) 9.56 % 1 application to affected area Externally once a day M-F prn     Clobetasol Prop Emollient Base (CLOBETASOL PROPIONATE E) 0.05 % emollient cream Apply to affected area 2 (two) times daily. 30 g 2   fluticasone (FLONASE) 50 MCG/ACT nasal spray Place 2 sprays into both nostrils daily as needed for allergies or rhinitis.     ibuprofen (ADVIL) 200 MG tablet Take 200 mg by mouth every 6 (six) hours as needed.     No current facility-administered medications on file prior to visit.    Allergies  Allergen Reactions    Phisohex [Hexachlorophene] Rash    Gets blisters    Social History   Substance and Sexual Activity  Alcohol Use Yes   Comment: Social drinking wine only    Social History   Tobacco Use  Smoking Status Never  Smokeless Tobacco Never    Review of Systems  Constitutional: Negative.   HENT:  Positive for sinus pain.   Eyes: Negative.   Respiratory: Negative.    Cardiovascular: Negative.   Gastrointestinal: Negative.   Genitourinary: Negative.   Musculoskeletal:  Positive for back pain, joint pain and neck pain.  Skin: Negative.   Neurological: Negative.   Endo/Heme/Allergies: Negative.   Psychiatric/Behavioral: Negative.      Objective   Vitals:   01/17/22 0852  BP: (!) 177/84  Pulse: 71  Resp: 16  Temp: 98.4 F (36.9 C)  SpO2: 92%    Physical Exam Vitals reviewed.  Constitutional:      Appearance: Normal appearance. She is obese. She is not ill-appearing.  HENT:     Head: Normocephalic and atraumatic.  Cardiovascular:     Rate and Rhythm: Normal rate and regular rhythm.     Heart sounds: Murmur heard.     No friction rub. No gallop.     Comments: 3 out of 6 systolic ejection murmur along the left parasternal region. Pulmonary:     Effort: Pulmonary effort is normal. No respiratory distress.  Breath sounds: Normal breath sounds. No stridor. No wheezing, rhonchi or rales.  Abdominal:     General: Bowel sounds are normal. There is no distension.     Palpations: Abdomen is soft. There is no mass.     Tenderness: There is no abdominal tenderness. There is no guarding or rebound.     Hernia: A hernia is present.     Comments: Large 4 to 5 cm umbilical hernia that is reducible and appears to contain omentum.  The skin is mildly thin, but not erythematous.  No ulcerations are present.  Skin:    General: Skin is warm and dry.  Neurological:     Mental Status: She is alert and oriented to person, place, and time.     Assessment  Umbilical hernia,  currently asymptomatic New heart murmur, patient reports this is being followed but has not seen cardiology or had a 2D echo. Plan  I told the patient that she did not have to have the umbilical hernia repaired at this time, though given its enlarging size and thinning skin, she may need to have this repaired in the future.  She would also need a cardiac work-up prior to surgical intervention given her the new heart murmur.  Literature was given.  The risks of incarceration were explained to the patient.  I told her that should she develop more skin thinning, ulcerations, or periumbilical pain, she should proceed with repair.  She understands and agrees.  Follow-up as needed.

## 2022-01-23 ENCOUNTER — Encounter: Payer: 59 | Admitting: Rehabilitative and Restorative Service Providers"

## 2022-05-15 ENCOUNTER — Ambulatory Visit: Payer: 59 | Admitting: Registered Nurse

## 2022-05-22 ENCOUNTER — Ambulatory Visit: Payer: 59 | Admitting: Registered"

## 2022-06-12 ENCOUNTER — Ambulatory Visit
Admission: RE | Admit: 2022-06-12 | Discharge: 2022-06-12 | Disposition: A | Discharge status: Home / Self Care | Payer: Medicare Other | Source: Ambulatory Visit | Attending: Family Medicine | Admitting: Family Medicine

## 2022-06-12 ENCOUNTER — Other Ambulatory Visit: Payer: Self-pay

## 2022-06-12 VITALS — BP 171/81 | HR 64 | Temp 98.8°F | Resp 20

## 2022-06-12 DIAGNOSIS — R030 Elevated blood-pressure reading, without diagnosis of hypertension: Secondary | ICD-10-CM

## 2022-06-12 DIAGNOSIS — L089 Local infection of the skin and subcutaneous tissue, unspecified: Secondary | ICD-10-CM | POA: Diagnosis not present

## 2022-06-12 DIAGNOSIS — B9689 Other specified bacterial agents as the cause of diseases classified elsewhere: Secondary | ICD-10-CM

## 2022-06-12 MED ORDER — DOXYCYCLINE HYCLATE 100 MG PO CAPS: 100.0000 mg | ORAL_CAPSULE | Freq: Two times a day (BID) | ORAL | 0 refills | Status: DC

## 2022-06-12 NOTE — ED Provider Notes (Signed)
[facility]   [id number] 06/12/22 Arrival Time: 2878  ASSESSMENT & PLAN:  1. Localized bacterial skin infection   2. Elevated blood pressure reading without diagnosis of hypertension    Trial of doxycycline first. Given location of area, I'm hopeful this will work. Opening this area will leave a wound that may take awhile to heal. But if she does not respond to doxy she can return here for potential I&D.  New Prescriptions   DOXYCYCLINE (VIBRAMYCIN) 100 MG CAPSULE    Take 1 capsule (100 mg total) by mouth 2 (two) times daily.     Discharge Instructions      Your blood pressure was noted to be elevated during your visit today. If you are currently taking medication for high blood pressure, please ensure you are taking this as directed. If you do not have a history of high blood pressure and your blood pressure remains persistently elevated, you may need to begin taking a medication at some point. You may return here within the next few days to recheck if unable to see your primary care provider or if you do not have a one.  BP (!) 171/81 (BP Location: Right Arm)   Pulse 64   Temp 98.8 F (37.1 C) (Oral)   Resp 20   SpO2 97%   BP Readings from Last 3 Encounters:  06/12/22 (!) 171/81  01/17/22 (!) 177/84  12/08/21 (!) 179/71        Follow-up Information     Schedule an appointment as soon as possible for a visit  with Maximiano Coss, NP.   Specialty: Nurse Practitioner Why: To recheck your blood pressure. Contact information: [place] [address] [phone]                 Reviewed expectations re: course of current medical issues. Questions answered. Outlined signs and symptoms indicating need for more acute intervention. Understanding verbalized. After Visit Summary given.   SUBJECTIVE: History from: Patient. KJERSTIN ABRIGO is a 72 y.o. female. Reports: area on inner L lower leg near ankle; increasing erythema and  tenderness. Denies: fever and drainage from area. Normal PO intake without n/v/d. Increased blood pressure noted today. Reports that she is not treated for HTN. Denies CP/SOB.  OBJECTIVE:  Vitals:   06/12/22 1116  BP: (!) 171/81  Pulse: 64  Resp: 20  Temp: 98.8 F (37.1 C)  TempSrc: Oral  SpO2: 97%    General appearance: alert; no distress Lungs: speaks full sentences without difficulty; unlabored Extremities: baseline mild edema; inner L superior ankle with sub-cm area of skin thickening with overlying mid erythema; TTP; small forming head Skin: warm and dry Neurologic: normal gait Psychological: alert and cooperative; normal mood and affect   Allergies  Allergen Reactions   Phisohex [Hexachlorophene] Rash    Gets blisters    Past Medical History:  Diagnosis Date   Allergy    Heart murmur 11/12/2021   High cholesterol    Osteoarthritis    "knees; thumbs" (10/01/2013)   Social History   Socioeconomic History   Marital status: Married    Spouse name: Not on file   Number of children: Not on file   Years of education: Not on file   Highest education level: Not on file  Occupational History   Not on file  Tobacco Use   Smoking status: Never   Smokeless tobacco: Never  Substance and Sexual Activity   Alcohol use: Yes    Comment: Social  drinking wine only   Drug use: No   Sexual activity: Not Currently    Comment: 85 years of age/spouse w/ ED  Other Topics Concern   Not on file  Social History Narrative   Not on file   Social Determinants of Health   Financial Resource Strain: Not on file  Food Insecurity: No Food Insecurity (09/25/2021)   Hunger Vital Sign    Worried About Running Out of Food in the Last Year: Never true    Ran Out of Food in the Last Year: Never true  Transportation Needs: Not on file  Physical Activity: Not on file  Stress: Not on file  Social Connections: Not on file  Intimate Partner Violence: Not on file   Family History   Problem Relation Age of Onset   Heart disease Father    Hypertension Father    Alcohol abuse Father    Past Surgical History:  Procedure Laterality Date   GANGLION CYST EXCISION Right 01/18/1989   INGUINAL HERNIA REPAIR Bilateral 1954   "age 40 month"   JOINT REPLACEMENT     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

## 2022-06-12 NOTE — Discharge Instructions (Addendum)
Your blood pressure was noted to be elevated during your visit today. If you are currently taking medication for high blood pressure, please ensure you are taking this as directed. If you do not have a history of high blood pressure and your blood pressure remains persistently elevated, you may need to begin taking a medication at some point. You may return here within the next few days to recheck if unable to see your primary care provider or if you do not have a one.  BP (!) 171/81 (BP Location: Right Arm)   Pulse 64   Temp 98.8 F (37.1 C) (Oral)   Resp 20   SpO2 97%   BP Readings from Last 3 Encounters:  06/12/22 (!) 171/81  01/17/22 (!) 177/84  12/08/21 (!) 179/71

## 2022-06-12 NOTE — ED Triage Notes (Addendum)
Pt reports "pimple-like" area of redness to left ankle x1 week. Pt denies any known injury or insect bite. Reports has used clobetasol ointment to site with no change in pain or symptoms. Denies any fever, nausea.

## 2022-10-18 ENCOUNTER — Ambulatory Visit (INDEPENDENT_AMBULATORY_CARE_PROVIDER_SITE_OTHER): Payer: Medicare Other | Admitting: Nurse Practitioner

## 2022-10-18 ENCOUNTER — Other Ambulatory Visit (HOSPITAL_COMMUNITY): Payer: Self-pay

## 2022-10-18 ENCOUNTER — Encounter: Payer: Self-pay | Admitting: Nurse Practitioner

## 2022-10-18 VITALS — BP 160/80 | HR 86 | Temp 98.9°F | Ht 62.5 in | Wt 274.0 lb

## 2022-10-18 DIAGNOSIS — R7303 Prediabetes: Secondary | ICD-10-CM | POA: Diagnosis not present

## 2022-10-18 DIAGNOSIS — M1990 Unspecified osteoarthritis, unspecified site: Secondary | ICD-10-CM | POA: Insufficient documentation

## 2022-10-18 DIAGNOSIS — R011 Cardiac murmur, unspecified: Secondary | ICD-10-CM | POA: Diagnosis not present

## 2022-10-18 DIAGNOSIS — M159 Polyosteoarthritis, unspecified: Secondary | ICD-10-CM

## 2022-10-18 DIAGNOSIS — I10 Essential (primary) hypertension: Secondary | ICD-10-CM

## 2022-10-18 DIAGNOSIS — D696 Thrombocytopenia, unspecified: Secondary | ICD-10-CM

## 2022-10-18 DIAGNOSIS — L409 Psoriasis, unspecified: Secondary | ICD-10-CM

## 2022-10-18 DIAGNOSIS — E785 Hyperlipidemia, unspecified: Secondary | ICD-10-CM

## 2022-10-18 HISTORY — DX: Essential (primary) hypertension: I10

## 2022-10-18 MED ORDER — CLOBETASOL PROPIONATE E 0.05 % EX CREA
1.0000 | TOPICAL_CREAM | Freq: Two times a day (BID) | CUTANEOUS | 2 refills | Status: DC
  Filled 2022-10-18: qty 30, 10d supply, fill #0
  Filled 2022-12-04: qty 30, 10d supply, fill #1
  Filled 2023-02-06: qty 30, 10d supply, fill #2

## 2022-10-18 NOTE — Assessment & Plan Note (Signed)
Chronic Asymptomatic cardiac echocardiogram ordered today.

## 2022-10-18 NOTE — Assessment & Plan Note (Signed)
Incidental finding Will check CBC at next lab draw.

## 2022-10-18 NOTE — Progress Notes (Signed)
Established Patient Office Visit  Subjective   Patient ID: Charlene Tapia, female    DOB: 1951/01/31  Age: 72 y.o. MRN: [id number]  Chief Complaint  Patient presents with   Establish Care   Heart Murmur    Patient arrives today to establish care with me as new PCP.  Her previous PCP was part of our group but has left the practice. Her main concern today is heart murmur.  She was told about 10 years ago that she has a heart murmur, has not had evaluation for this.  Appears to be asymptomatic. She also reports osteoarthritis of multiple joints, and intermittent flares of joint pain.  She is been noticing these flares for the last 5 to 6 years, they result in her feeling more heavy and increased pain.  Usually last about 24 hours sometimes associated with weather changes but not always.  She treats with Motrin 400 mg 3 times daily. She also has psoriasis and is requesting refill on her clobetasol cream that she uses as needed. Per chart review she has had mild thrombocytopenia last time labs were drawn which was approximately 1 year ago She also has prediabetes with last A1c of 6.0 and hyperlipidemia with last LDL 129.  She reports that she is not willing to take statin therapy.    Review of Systems  Constitutional:  Negative for malaise/fatigue.  HENT:  Positive for congestion.   Respiratory:  Positive for shortness of breath (chronic, has not worsened).   Cardiovascular:  Negative for chest pain, palpitations, orthopnea, leg swelling and PND.      Objective:     BP (!) 160/80   Pulse 86   Temp 98.9 F (37.2 C) (Temporal)   Ht 5' 2.5" (1.588 m)   Wt 274 lb (124.3 kg)   SpO2 94%   BMI 49.32 kg/m  BP Readings from Last 3 Encounters:  10/18/22 (!) 160/80  06/12/22 (!) 171/81  01/17/22 (!) 177/84   Wt Readings from Last 3 Encounters:  10/18/22 274 lb (124.3 kg)  01/17/22 275 lb (124.7 kg)  11/12/21 272 lb 6.4 oz (123.6 kg)      Physical Exam Vitals reviewed.   Constitutional:      General: She is not in acute distress.    Appearance: Normal appearance.  HENT:     Head: Normocephalic and atraumatic.  Neck:     Vascular: No carotid bruit.  Cardiovascular:     Rate and Rhythm: Normal rate and regular rhythm.     Pulses: Normal pulses.     Heart sounds: Murmur heard.     Systolic murmur is present.  Pulmonary:     Effort: Pulmonary effort is normal.     Breath sounds: Normal breath sounds.  Skin:    General: Skin is warm and dry.  Neurological:     General: No focal deficit present.     Mental Status: She is alert and oriented to person, place, and time.  Psychiatric:        Mood and Affect: Mood normal.        Behavior: Behavior normal.        Judgment: Judgment normal.      No results found for any visits on 10/18/22.    The 10-year ASCVD risk score (Arnett DK, et al., 2019) is: 14.6%    Assessment & Plan:   Problem List Items Addressed This Visit       Cardiovascular and Mediastinum   HTN (hypertension)  Blood pressure elevated today, Patient reports at home blood pressure usually runs 130-140/80 We did discuss importance of managing high blood pressure and the new definition is that hypertension is greater than 130 over greater than 80. Patient will return in 2 weeks for close monitoring of blood pressure, she will keep an at home log as well.  Further recommendations may be made at next office visit.        Musculoskeletal and Integument   Psoriasis    Clobetasol propionate 0.05% emollient cream to be used twice a day as needed sent to patient's pharmacy.      Relevant Medications   Clobetasol Prop Emollient Base (CLOBETASOL PROPIONATE E) 0.05 % emollient cream   Other Relevant Orders   CBC   Basic metabolic panel   Hemoglobin A1c   Lipid panel   Osteoarthritis    Chronic Pain well-controlled when taking as needed ibuprofen. Unsure of etiology of flareups of pain.  We did have a discussion regarding  whether or not she would like evaluation for autoimmune disease, for now patient would like to defer this.  I am agreeable as well. Continue to use as needed ibuprofen as she reports Tylenol does not help with pain.        Hematopoietic and Hemostatic   Thrombocytopenia (HCC)    Incidental finding Will check CBC at next lab draw.      Relevant Orders   CBC   Basic metabolic panel   Hemoglobin A1c   Lipid panel     Other   Murmur - Primary    Chronic Asymptomatic cardiac echocardiogram ordered today.      Relevant Orders   ECHOCARDIOGRAM COMPLETE   CBC   Basic metabolic panel   Hemoglobin A1c   Lipid panel   Hyperlipidemia    Chronic Patient is not fasting today, she will return to the office within the next week or so to have fasting lipid panel collected.  Further recommendations may be made based upon these results.      Relevant Orders   CBC   Basic metabolic panel   Hemoglobin A1c   Lipid panel   Prediabetes    Chronic Last A1c 6.0 Patient return to office within the next week or 2 to have A1c redrawn.      Relevant Orders   CBC   Basic metabolic panel   Hemoglobin A1c   Lipid panel    Return in about 2 weeks (around 11/01/2022) for initial AWV with nurse; F/U with Maralyn Sago.  Total time spent today's encounter was 39 minutes including face-to-face evaluation, review of previous medical records, and development/discussion of treatment plan.   Elenore Paddy, NP

## 2022-10-18 NOTE — Assessment & Plan Note (Signed)
Blood pressure elevated today, Patient reports at home blood pressure usually runs 130-140/80 We did discuss importance of managing high blood pressure and the new definition is that hypertension is greater than 130 over greater than 80. Patient will return in 2 weeks for close monitoring of blood pressure, she will keep an at home log as well.  Further recommendations may be made at next office visit.

## 2022-10-18 NOTE — Assessment & Plan Note (Signed)
Clobetasol propionate 0.05% emollient cream to be used twice a day as needed sent to patient's pharmacy.

## 2022-10-18 NOTE — Assessment & Plan Note (Signed)
Chronic Pain well-controlled when taking as needed ibuprofen. Unsure of etiology of flareups of pain.  We did have a discussion regarding whether or not she would like evaluation for autoimmune disease, for now patient would like to defer this.  I am agreeable as well. Continue to use as needed ibuprofen as she reports Tylenol does not help with pain.

## 2022-10-18 NOTE — Assessment & Plan Note (Signed)
Chronic Patient is not fasting today, she will return to the office within the next week or so to have fasting lipid panel collected.  Further recommendations may be made based upon these results.

## 2022-10-18 NOTE — Assessment & Plan Note (Signed)
Chronic Last A1c 6.0 Patient return to office within the next week or 2 to have A1c redrawn.

## 2022-10-24 ENCOUNTER — Other Ambulatory Visit (INDEPENDENT_AMBULATORY_CARE_PROVIDER_SITE_OTHER): Payer: Medicare Other

## 2022-10-24 DIAGNOSIS — R7303 Prediabetes: Secondary | ICD-10-CM

## 2022-10-24 DIAGNOSIS — L409 Psoriasis, unspecified: Secondary | ICD-10-CM

## 2022-10-24 DIAGNOSIS — E785 Hyperlipidemia, unspecified: Secondary | ICD-10-CM | POA: Diagnosis not present

## 2022-10-24 DIAGNOSIS — D696 Thrombocytopenia, unspecified: Secondary | ICD-10-CM | POA: Diagnosis not present

## 2022-10-24 DIAGNOSIS — R011 Cardiac murmur, unspecified: Secondary | ICD-10-CM

## 2022-10-24 LAB — BASIC METABOLIC PANEL
BUN: 18 mg/dL (ref 6–23)
CO2: 28 mEq/L (ref 19–32)
Calcium: 9.4 mg/dL (ref 8.4–10.5)
Chloride: 104 mEq/L (ref 96–112)
Creatinine, Ser: 0.7 mg/dL (ref 0.40–1.20)
GFR: 87.69 mL/min (ref >60.00)
Glucose, Bld: 94 mg/dL (ref 70–99)
Potassium: 4.7 mEq/L (ref 3.5–5.1)
Sodium: 140 mEq/L (ref 135–145)

## 2022-10-24 LAB — LIPID PANEL
Cholesterol: 184 mg/dL (ref 0–200)
HDL: 48.3 mg/dL (ref >39.00)
LDL Cholesterol: 119 mg/dL — ABNORMAL HIGH (ref 0–99)
NonHDL: 136.19
Total CHOL/HDL Ratio: 4
Triglycerides: 88 mg/dL (ref 0.0–149.0)
VLDL: 17.6 mg/dL (ref 0.0–40.0)

## 2022-10-24 LAB — CBC
HCT: 38.8 % (ref 36.0–46.0)
Hemoglobin: 12.5 g/dL (ref 12.0–15.0)
MCHC: 32.3 g/dL (ref 30.0–36.0)
MCV: 89.4 fl (ref 78.0–100.0)
Platelets: 151 10*3/uL (ref 150.0–400.0)
RBC: 4.34 Mil/uL (ref 3.87–5.11)
RDW: 13.9 % (ref 11.5–15.5)
WBC: 5.4 10*3/uL (ref 4.0–10.5)

## 2022-10-24 LAB — HEMOGLOBIN A1C: Hgb A1c MFr Bld: 5.8 % (ref 4.6–6.5)

## 2022-11-01 ENCOUNTER — Ambulatory Visit (INDEPENDENT_AMBULATORY_CARE_PROVIDER_SITE_OTHER): Payer: Medicare Other | Admitting: Nurse Practitioner

## 2022-11-01 ENCOUNTER — Other Ambulatory Visit (HOSPITAL_COMMUNITY): Payer: Self-pay

## 2022-11-01 VITALS — BP 172/90 | HR 70 | Temp 97.6°F | Ht 62.5 in | Wt 271.0 lb

## 2022-11-01 DIAGNOSIS — E785 Hyperlipidemia, unspecified: Secondary | ICD-10-CM

## 2022-11-01 DIAGNOSIS — I10 Essential (primary) hypertension: Secondary | ICD-10-CM | POA: Diagnosis not present

## 2022-11-01 DIAGNOSIS — Z6841 Body Mass Index (BMI) 40.0 and over, adult: Secondary | ICD-10-CM | POA: Diagnosis not present

## 2022-11-01 DIAGNOSIS — R7303 Prediabetes: Secondary | ICD-10-CM

## 2022-11-01 DIAGNOSIS — E669 Obesity, unspecified: Secondary | ICD-10-CM | POA: Insufficient documentation

## 2022-11-01 MED ORDER — AMLODIPINE BESYLATE 5 MG PO TABS
5.0000 mg | ORAL_TABLET | Freq: Every day | ORAL | 1 refills | Status: DC
  Filled 2022-11-01: qty 90, 90d supply, fill #0
  Filled 2023-02-06: qty 90, 90d supply, fill #1

## 2022-11-01 NOTE — Progress Notes (Signed)
Established Patient Office Visit  Subjective   Patient ID: Charlene Tapia, female    DOB: 1950-08-15  Age: 72 y.o. MRN: [id number]  Chief Complaint  Patient presents with   Hypertension    HTN/Obesity/Prediabetes/HLD: Patient presents today for close follow-up regarding her blood pressure.  She brings at home blood pressure log which ranges from 123-145/64-80 over the last 2 weeks.  No chest pain or shortness of breath.  Majority of readings identify systolic blood pressure greater than 130.  Patient reports has been increasing physical activity aimed at weight loss.  Last A1c revealed level of 5.8.  Last LDL 119, compared to cholesterol panel from 4 years ago LDL is much improved it was 157 then.  Current ASCVD risk score is around 60%, patient would prefer not to take statin therapy, may consider other cholesterol-lowering medication options.    Estimated Creatinine Clearance: 82.5 mL/min (by C-G formula based on SCr of 0.7 mg/dL).     Review of Systems  Constitutional:  Negative for malaise/fatigue.  Respiratory:  Negative for shortness of breath.   Cardiovascular:  Negative for chest pain.      Objective:     BP (!) 172/90   Pulse 70   Temp 97.6 F (36.4 C) (Temporal)   Ht 5' 2.5" (1.588 m)   Wt 271 lb (122.9 kg)   SpO2 93%   BMI 48.78 kg/m  BP Readings from Last 3 Encounters:  11/01/22 (!) 172/90  10/18/22 (!) 160/80  06/12/22 (!) 171/81   Wt Readings from Last 3 Encounters:  11/01/22 271 lb (122.9 kg)  10/18/22 274 lb (124.3 kg)  01/17/22 275 lb (124.7 kg)      Physical Exam Vitals reviewed.  Constitutional:      General: She is not in acute distress.    Appearance: Normal appearance.  HENT:     Head: Normocephalic and atraumatic.  Neck:     Vascular: No carotid bruit.  Cardiovascular:     Rate and Rhythm: Normal rate and regular rhythm.     Pulses: Normal pulses.     Heart sounds: Normal heart sounds.  Pulmonary:     Effort: Pulmonary effort  is normal.     Breath sounds: Normal breath sounds.  Skin:    General: Skin is warm and dry.  Neurological:     General: No focal deficit present.     Mental Status: She is alert and oriented to person, place, and time.  Psychiatric:        Mood and Affect: Mood normal.        Behavior: Behavior normal.        Judgment: Judgment normal.      No results found for any visits on 11/01/22.    The 10-year ASCVD risk score (Arnett DK, et al., 2019) is: 21.7%    Assessment & Plan:   Problem List Items Addressed This Visit       Cardiovascular and Mediastinum   HTN (hypertension) - Primary    Chronic At home blood pressure is much better compared to in office readings.  Patient does report some stress and anxiety regarding blood pressure being taken at the office.  Per shared decision making we will start her on amlodipine 5 mg/day. Patient to follow-up in 6 weeks      Relevant Medications   amLODipine (NORVASC) 5 MG tablet     Other   Hyperlipidemia    Chronic We did discuss ASCVD risk score and that  she should consider cholesterol-lowering medication if she would like to try to prevent heart attack and stroke.  We did discuss there are other options other than statins, and if she would like to pursue these we can refer her to the lipid clinic.  She reports that she would prefer to defer starting medication at this time but will continue to focus on lifestyle modification.      Relevant Medications   amLODipine (NORVASC) 5 MG tablet   Prediabetes    Chronic A1C 5.8 Encouraged continual adherence to low glycemic index and exercise of 145mins/week      Obesity    Chronic She has lost 3 lbs since I saw her last. She was encouraged to continue working on improving diet and exercise to help with weight loss       Return in about 6 weeks (around 12/13/2022) for F/U with Maralyn Sago.  Total time spent on encounter today was 40 minutes including face-to-face evaluation, review of  previous records, and discussion versus development of treatment plan.   Elenore Paddy, NP

## 2022-11-01 NOTE — Assessment & Plan Note (Signed)
Chronic We did discuss ASCVD risk score and that she should consider cholesterol-lowering medication if she would like to try to prevent heart attack and stroke.  We did discuss there are other options other than statins, and if she would like to pursue these we can refer her to the lipid clinic.  She reports that she would prefer to defer starting medication at this time but will continue to focus on lifestyle modification.

## 2022-11-01 NOTE — Assessment & Plan Note (Signed)
Chronic A1C 5.8 Encouraged continual adherence to low glycemic index and exercise of 166mins/week

## 2022-11-01 NOTE — Assessment & Plan Note (Signed)
Chronic At home blood pressure is much better compared to in office readings.  Patient does report some stress and anxiety regarding blood pressure being taken at the office.  Per shared decision making we will start her on amlodipine 5 mg/day. Patient to follow-up in 6 weeks

## 2022-11-01 NOTE — Assessment & Plan Note (Signed)
Chronic She has lost 3 lbs since I saw her last. She was encouraged to continue working on improving diet and exercise to help with weight loss

## 2022-11-20 ENCOUNTER — Ambulatory Visit (HOSPITAL_COMMUNITY): Payer: Medicare Other | Attending: Nurse Practitioner

## 2022-11-20 ENCOUNTER — Encounter: Payer: Self-pay | Admitting: Internal Medicine

## 2022-11-20 ENCOUNTER — Ambulatory Visit (INDEPENDENT_AMBULATORY_CARE_PROVIDER_SITE_OTHER): Payer: Medicare Other | Admitting: Internal Medicine

## 2022-11-20 VITALS — BP 142/58 | HR 89 | Ht 62.5 in | Wt 269.6 lb

## 2022-11-20 DIAGNOSIS — R011 Cardiac murmur, unspecified: Secondary | ICD-10-CM | POA: Diagnosis present

## 2022-11-20 DIAGNOSIS — Z01812 Encounter for preprocedural laboratory examination: Secondary | ICD-10-CM | POA: Diagnosis present

## 2022-11-20 DIAGNOSIS — I350 Nonrheumatic aortic (valve) stenosis: Secondary | ICD-10-CM | POA: Diagnosis present

## 2022-11-20 DIAGNOSIS — I10 Essential (primary) hypertension: Secondary | ICD-10-CM

## 2022-11-20 LAB — ECHOCARDIOGRAM COMPLETE
AR max vel: 1.09 cm2
AV Area VTI: 1.1 cm2
AV Area mean vel: 1.02 cm2
AV Mean grad: 49 mmHg
AV Peak grad: 80.6 mmHg
Ao pk vel: 4.49 m/s
Area-P 1/2: 3.12 cm2
MV VTI: 2.44 cm2
P 1/2 time: 380 msec
S' Lateral: 2.9 cm

## 2022-11-20 NOTE — Patient Instructions (Signed)
Medication Instructions:  No changes *If you need a refill on your cardiac medications before your next appointment, please call your pharmacy*   Lab Work: Today: cbc, bmet If you have labs (blood work) drawn today and your tests are completely normal, you will receive your results only by: MyChart Message (if you have MyChart) OR A paper copy in the mail If you have any lab test that is abnormal or we need to change your treatment, we will call you to review the results.   Testing/Procedures: Your physician has requested that you have a cardiac catheterization. Cardiac catheterization is used to diagnose and/or treat various heart conditions. Doctors may recommend this procedure for a number of different reasons. The most common reason is to evaluate chest pain. Chest pain can be a symptom of coronary artery disease (CAD), and cardiac catheterization can show whether plaque is narrowing or blocking your heart's arteries. This procedure is also used to evaluate the valves, as well as measure the blood flow and oxygen levels in different parts of your heart. For further information please visit [email or web address] Please follow instruction sheet, as given.    Follow-Up: Per Structural Heart Team          Cardiac/Peripheral Catheterization   You are scheduled for a Cardiac Catheterization on Monday, July 8 with Dr. Alverda Skeans.  1. Please arrive at the [facility] (Main Entrance A) at [facility]: [address] [place], [place] [address] at 8:30 AM (This time is TWO  hour(s) before your procedure to ensure your preparation). Free valet parking service is available. You will check in at ADMITTING. The support person will be asked to wait in the waiting room.  It is OK to have someone drop you off and come back when you are ready to be discharged.        Special note: Every effort is made to have your procedure done on time. Please understand that emergencies sometimes delay  scheduled procedures.  2. Diet: Do not eat solid foods after midnight.  You may have clear liquids until 5 AM the day of the procedure.  3. Labs: You will need to have blood drawn today.  You do not need to be fasting.  4. Medication instructions in preparation for your procedure:   Contrast Allergy: No   On the morning of your procedure, take Aspirin 81 mg and any morning medicines NOT listed above.  You may use sips of water.  5. Plan to go home the same day, you will only stay overnight if medically necessary. 6. You MUST have a responsible adult to drive you home. 7. An adult MUST be with you the first 24 hours after you arrive home. 8. Bring a current list of your medications, and the last time and date medication taken. 9. Bring ID and current insurance cards. 10.Please wear clothes that are easy to get on and off and wear slip-on shoes.  Thank you for allowing [place] to care for you!   -- [facility]

## 2022-11-20 NOTE — Addendum Note (Signed)
Addended by: Gunnar Fusi A on: 11/20/2022 04:50 PM   Modules accepted: Orders

## 2022-11-20 NOTE — Progress Notes (Addendum)
Pre Surgical Assessment: 5 M Walk Test  56M=16.58ft  5 Meter Walk Test- trial 1: 4.67 seconds 5 Meter Walk Test- trial 2: 5.18 seconds 5 Meter Walk Test- trial 3: 5.03 seconds 5 Meter Walk Test Average: 4.96 seconds  ___________________   Procedure Type: Isolated AVR PERIOPERATIVE OUTCOME ESTIMATE % Operative Mortality 3.19% Morbidity & Mortality 10.9% Stroke 0.653% Renal Failure 2.19% Reoperation 3.34% Prolonged Ventilation 5.26% Deep Sternal Wound Infection 0.185% Long Hospital Stay (>14 days) 4.81% Short Hospital Stay (<6 days)* 37.9%

## 2022-11-20 NOTE — H&P (View-Only) (Signed)
 Patient ID: Charlene Tapia MRN: [id number] DOB/AGE: 12/09/1950 72 y.o.  Primary Care Physician:Gray, Sarah E, NP Primary Cardiologist: Sukhmani Fetherolf, MD   FOCUSED CARDIOVASCULAR PROBLEM LIST:   1.  Aortic valvular disease consisting of severe aortic stenosis and moderate aortic insufficiency with a dimensionless index of 0.3, mean gradient of 49 mmHg and peak velocity of 4.45 m/s with ejection fraction 60 to 65%; EKG today demonstrates sinus rhythm with occasional PVCs 2.  Hypertension 3.  BMI of 48   HISTORY OF PRESENT ILLNESS: The patient is a 72 y.o. female with the indicated medical history here for recommendations regarding patient's aortic valvular disease.  The patient was referred for an echocardiogram today which demonstrated severe aortic stenosis and moderate aortic insufficiency consistent with severe aortic valvular disease.  She saw her PCP recently and a murmur (which has been longstanding) was noticed and an echo was ordered.  The patient is here with her husband.  She is quite overweight and by her own admission does not do much in a day.  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  She however was quite active several years ago.  However her husband has noticed that she walks much slower now.  She has noticed increasing fatigue and shortness of breath just by moving groceries in from her car.  She fortunately has not required any hospitalizations or emergency room visits for heart failure or chest pain symptoms.  She denies any presyncope, syncope, chest pain, peripheral edema, paroxysmal nocturnal dyspnea or palpitations.  She has had no severe bleeding or bruising episodes.  She regularly sees a dentist.  Past Medical History:  Diagnosis Date   Allergy    Heart murmur 11/12/2021   High cholesterol    HTN (hypertension) 10/18/2022   Osteoarthritis    "knees; thumbs" (10/01/2013)    Past Surgical History:  Procedure Laterality Date    GANGLION CYST EXCISION Right 01/18/1989   INGUINAL HERNIA REPAIR Bilateral 1954   "age 6 month"   JOINT REPLACEMENT     TOTAL KNEE ARTHROPLASTY Left 08/16/2013   Procedure: TOTAL KNEE ARTHROPLASTY;  Surgeon: Frank J Rowan, MD;  Location: [facility];  Service: Orthopedics;  Laterality: Left;   TOTAL KNEE ARTHROPLASTY Right 09/29/2013   TOTAL KNEE ARTHROPLASTY Right 09/29/2013   Procedure: TOTAL KNEE ARTHROPLASTY;  Surgeon: Frank J Rowan, MD;  Location: [facility] OR;  Service: Orthopedics;  Laterality: Right;    Family History  Problem Relation Age of Onset   Heart disease Father    Hypertension Father    Alcohol abuse Father     Social History   Socioeconomic History   Marital status: Married    Spouse name: Not on file   Number of children: Not on file   Years of education: Not on file   Highest education level: Not on file  Occupational History   Not on file  Tobacco Use   Smoking status: Never   Smokeless tobacco: Never  Substance and Sexual Activity   Alcohol use: Yes    Comment: Social drinking wine only   Drug use: No   Sexual activity: Not Currently    Comment: 66 years of age/spouse w/ ED  Other Topics Concern   Not on file  Social History Narrative   Not on file   Social Determinants of Health   Financial Resource Strain: Not on file  Food Insecurity: No Food Insecurity (09/25/2021)   Hunger Vital Sign      Worried About Running Out of Food in the Last Year: Never true    Ran Out of Food in the Last Year: Never true  Transportation Needs: Not on file  Physical Activity: Not on file  Stress: Not on file  Social Connections: Not on file  Intimate Partner Violence: Not on file     Prior to Admission medications   Medication Sig Start Date End Date Taking? Authorizing Provider  amLODipine (NORVASC) 5 MG tablet Take 1 tablet (5 mg total) by mouth daily. 11/01/22   Gray, Sarah E, NP  Clobetasol Prop Emollient Base (CLOBETASOL PROPIONATE E) 0.05 % emollient cream Apply to  affected area 2 (two) times daily. 10/18/22   Gray, Sarah E, NP  fluticasone (FLONASE) 50 MCG/ACT nasal spray Place 2 sprays into both nostrils daily as needed for allergies or rhinitis.    [provider]  ibuprofen (ADVIL) 200 MG tablet Take 400 mg by mouth every 8 (eight) hours as needed for mild pain or moderate pain.    [provider]    Allergies  Allergen Reactions   Phisohex [Hexachlorophene] Rash    Gets blisters    REVIEW OF SYSTEMS:  General: no fevers/chills/night sweats Eyes: no blurry vision, diplopia, or amaurosis ENT: no sore throat or hearing loss Resp: no cough, wheezing, or hemoptysis CV: no edema or palpitations GI: no abdominal pain, nausea, vomiting, diarrhea, or constipation GU: no dysuria, frequency, or hematuria Skin: no rash Neuro: no headache, numbness, tingling, or weakness of extremities Musculoskeletal: no joint pain or swelling Heme: no bleeding, DVT, or easy bruising Endo: no polydipsia or polyuria  BP (!) 142/58   Pulse 89   Ht 5' 2.5" (1.588 m)   Wt 269 lb 9.6 oz (122.3 kg)   SpO2 96%   BMI 48.52 kg/m   PHYSICAL EXAM: GEN:  AO x 3 in no acute distress HEENT: normal Dentition: Normal Neck: JVP normal. +2 carotid upstrokes without bruits. No thyromegaly. Lungs: equal expansion, clear bilaterally CV: Apex is discrete and nondisplaced, RRR with 3/6 SEM Abd: soft, non-tender, non-distended; no bruit; positive bowel sounds Ext: no edema, ecchymoses, or cyanosis Vascular: 2+ femoral pulses, 2+ radial pulses       Skin: warm and dry without rash Neuro: CN II-XII grossly intact; motor and sensory grossly intact    DATA AND STUDIES:  EKG: Sinus rhythm with premature ventricular complexes left posterior fascicular block; EKG done today that I personally reviewed.  2D ECHO:    1. Left ventricular ejection fraction, by estimation, is 60 to 65%. The  left ventricle has normal function. The left ventricle has no regional   wall motion abnormalities. There is mild left ventricular hypertrophy.  Left ventricular diastolic parameters  are consistent with Grade II diastolic dysfunction (pseudonormalization).   2. Right ventricular systolic function is normal. The right ventricular  size is mildly enlarged. There is mildly elevated pulmonary artery  systolic pressure.   3. Left atrial size was moderately dilated.   4. Right atrial size was mildly dilated.   5. The mitral valve is degenerative. Mild mitral valve regurgitation.  Moderate mitral stenosis. The mean mitral valve gradient is 6.0 mmHg.   6. AV area may be overestimated based on inaccurate LVOT diameter. The  aortic valve is calcified. Aortic valve regurgitation is moderate. Severe  aortic valve stenosis. Aortic valve mean gradient measures 49.0 mmHg.  Aortic valve Vmax measures 4.49 m/s.   7. There is mild dilatation of the ascending aorta, measuring 40   mm.   8. The inferior vena cava is normal in size with greater than 50%  respiratory variability, suggesting right atrial pressure of 3 mmHg.   CARDIAC CATH: n/a  STS RISK CALCULATOR: pending  NHYA CLASS: 2    ASSESSMENT AND PLAN:   Hypertension, unspecified type - Plan: EKG 12-Lead, CBC  Pre-procedure lab exam - Plan: Basic metabolic panel, CBC  The patient has developed severe symptomatic aortic valvular disease consisting of severe aortic stenosis and mild to moderate aortic insufficiency.  But a long conversation and it is clear that she has developed symptoms and by her own admission she believes she is under playing the symptoms at times.  Her mean gradient is quite elevated and some of this is likely flow related from her aortic insufficiency.  We talked about evaluation for aortic valve intervention.  I will refer her for cardiac catheterization, CTA, and cardiothoracic surgical referral.  She does see a dentist on a regular basis.   I have personally reviewed the patients imaging data  as summarized above.  I have reviewed the natural history of aortic stenosis with the patient and family members who are present today. We have discussed the limitations of medical therapy and the poor prognosis associated with symptomatic aortic stenosis. We have also reviewed potential treatment options, including palliative medical therapy, conventional surgical aortic valve replacement, and transcatheter aortic valve replacement. We discussed treatment options in the context of this patient's specific comorbid medical conditions.   All of the patient's questions were answered today. Will make further recommendations based on the results of studies outlined above.   Total time spent with patient today 40 minutes. This includes reviewing records, evaluating the patient and coordinating care.   Johnie Makki K Cailan Antonucci, MD  11/20/2022 4:06 PM    [facility] [address], [place], [place]  [address] Phone: [phone]; Fax: [phone]     

## 2022-11-20 NOTE — Progress Notes (Signed)
Patient ID: Charlene Tapia MRN: [id number] DOB/AGE: 1950/10/30 72 y.o.  Primary Care Physician:Gray, Dayton Scrape, NP Primary Cardiologist: Alverda Skeans, MD   FOCUSED CARDIOVASCULAR PROBLEM LIST:   1.  Aortic valvular disease consisting of severe aortic stenosis and moderate aortic insufficiency with a dimensionless index of 0.3, mean gradient of 49 mmHg and peak velocity of 4.45 m/s with ejection fraction 60 to 65%; EKG today demonstrates sinus rhythm with occasional PVCs 2.  Hypertension 3.  BMI of 48   HISTORY OF PRESENT ILLNESS: The patient is a 72 y.o. female with the indicated medical history here for recommendations regarding patient's aortic valvular disease.  The patient was referred for an echocardiogram today which demonstrated severe aortic stenosis and moderate aortic insufficiency consistent with severe aortic valvular disease.  She saw her PCP recently and a murmur (which has been longstanding) was noticed and an echo was ordered.  The patient is here with her husband.  She is quite overweight and by her own admission does not do much in a day.  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  She however was quite active several years ago.  However her husband has noticed that she walks much slower now.  She has noticed increasing fatigue and shortness of breath just by moving groceries in from her car.  She fortunately has not required any hospitalizations or emergency room visits for heart failure or chest pain symptoms.  She denies any presyncope, syncope, chest pain, peripheral edema, paroxysmal nocturnal dyspnea or palpitations.  She has had no severe bleeding or bruising episodes.  She regularly sees a Education officer, community.  Past Medical History:  Diagnosis Date   Allergy    Heart murmur 11/12/2021   High cholesterol    HTN (hypertension) 10/18/2022   Osteoarthritis    "knees; thumbs" (10/01/2013)    Past Surgical History:  Procedure Laterality Date    GANGLION CYST EXCISION Right 01/18/1989   INGUINAL HERNIA REPAIR Bilateral 1954   "age 61 month"   JOINT REPLACEMENT     TOTAL KNEE ARTHROPLASTY Left 08/16/2013   Procedure: TOTAL KNEE ARTHROPLASTY;  Surgeon: Nestor Lewandowsky, MD;  Location: [facility] OR;  Service: Orthopedics;  Laterality: Left;   TOTAL KNEE ARTHROPLASTY Right 09/29/2013   TOTAL KNEE ARTHROPLASTY Right 09/29/2013   Procedure: TOTAL KNEE ARTHROPLASTY;  Surgeon: Nestor Lewandowsky, MD;  Location: [facility] OR;  Service: Orthopedics;  Laterality: Right;    Family History  Problem Relation Age of Onset   Heart disease Father    Hypertension Father    Alcohol abuse Father     Social History   Socioeconomic History   Marital status: Married    Spouse name: Not on file   Number of children: Not on file   Years of education: Not on file   Highest education level: Not on file  Occupational History   Not on file  Tobacco Use   Smoking status: Never   Smokeless tobacco: Never  Substance and Sexual Activity   Alcohol use: Yes    Comment: Social drinking wine only   Drug use: No   Sexual activity: Not Currently    Comment: 31 years of age/spouse w/ ED  Other Topics Concern   Not on file  Social History Narrative   Not on file   Social Determinants of Health   Financial Resource Strain: Not on file  Food Insecurity: No Food Insecurity (09/25/2021)   Hunger Vital Sign  Worried About Programme researcher, broadcasting/film/video in the Last Year: Never true    Ran Out of Food in the Last Year: Never true  Transportation Needs: Not on file  Physical Activity: Not on file  Stress: Not on file  Social Connections: Not on file  Intimate Partner Violence: Not on file     Prior to Admission medications   Medication Sig Start Date End Date Taking? Authorizing Provider  amLODipine (NORVASC) 5 MG tablet Take 1 tablet (5 mg total) by mouth daily. 11/01/22   Elenore Paddy, NP  Clobetasol Prop Emollient Base (CLOBETASOL PROPIONATE E) 0.05 % emollient cream Apply to  affected area 2 (two) times daily. 10/18/22   Elenore Paddy, NP  fluticasone Aleda Grana) 50 MCG/ACT nasal spray Place 2 sprays into both nostrils daily as needed for allergies or rhinitis.    [provider]  ibuprofen (ADVIL) 200 MG tablet Take 400 mg by mouth every 8 (eight) hours as needed for mild pain or moderate pain.    [provider]    Allergies  Allergen Reactions   Phisohex [Hexachlorophene] Rash    Gets blisters    REVIEW OF SYSTEMS:  General: no fevers/chills/night sweats Eyes: no blurry vision, diplopia, or amaurosis ENT: no sore throat or hearing loss Resp: no cough, wheezing, or hemoptysis CV: no edema or palpitations GI: no abdominal pain, nausea, vomiting, diarrhea, or constipation GU: no dysuria, frequency, or hematuria Skin: no rash Neuro: no headache, numbness, tingling, or weakness of extremities Musculoskeletal: no joint pain or swelling Heme: no bleeding, DVT, or easy bruising Endo: no polydipsia or polyuria  BP (!) 142/58   Pulse 89   Ht 5' 2.5" (1.588 m)   Wt 269 lb 9.6 oz (122.3 kg)   SpO2 96%   BMI 48.52 kg/m   PHYSICAL EXAM: GEN:  AO x 3 in no acute distress HEENT: normal Dentition: Normal Neck: JVP normal. +2 carotid upstrokes without bruits. No thyromegaly. Lungs: equal expansion, clear bilaterally CV: Apex is discrete and nondisplaced, RRR with 3/6 SEM Abd: soft, non-tender, non-distended; no bruit; positive bowel sounds Ext: no edema, ecchymoses, or cyanosis Vascular: 2+ femoral pulses, 2+ radial pulses       Skin: warm and dry without rash Neuro: CN II-XII grossly intact; motor and sensory grossly intact    DATA AND STUDIES:  EKG: Sinus rhythm with premature ventricular complexes left posterior fascicular block; EKG done today that I personally reviewed.  2D ECHO:    1. Left ventricular ejection fraction, by estimation, is 60 to 65%. The  left ventricle has normal function. The left ventricle has no regional   wall motion abnormalities. There is mild left ventricular hypertrophy.  Left ventricular diastolic parameters  are consistent with Grade II diastolic dysfunction (pseudonormalization).   2. Right ventricular systolic function is normal. The right ventricular  size is mildly enlarged. There is mildly elevated pulmonary artery  systolic pressure.   3. Left atrial size was moderately dilated.   4. Right atrial size was mildly dilated.   5. The mitral valve is degenerative. Mild mitral valve regurgitation.  Moderate mitral stenosis. The mean mitral valve gradient is 6.0 mmHg.   6. AV area may be overestimated based on inaccurate LVOT diameter. The  aortic valve is calcified. Aortic valve regurgitation is moderate. Severe  aortic valve stenosis. Aortic valve mean gradient measures 49.0 mmHg.  Aortic valve Vmax measures 4.49 m/s.   7. There is mild dilatation of the ascending aorta, measuring 40  mm.   8. The inferior vena cava is normal in size with greater than 50%  respiratory variability, suggesting right atrial pressure of 3 mmHg.   CARDIAC CATH: n/a  STS RISK CALCULATOR: pending  NHYA CLASS: 2    ASSESSMENT AND PLAN:   Hypertension, unspecified type - Plan: EKG 12-Lead, CBC  Pre-procedure lab exam - Plan: Basic metabolic panel, CBC  The patient has developed severe symptomatic aortic valvular disease consisting of severe aortic stenosis and mild to moderate aortic insufficiency.  But a long conversation and it is clear that she has developed symptoms and by her own admission she believes she is under playing the symptoms at times.  Her mean gradient is quite elevated and some of this is likely flow related from her aortic insufficiency.  We talked about evaluation for aortic valve intervention.  I will refer her for cardiac catheterization, CTA, and cardiothoracic surgical referral.  She does see a dentist on a regular basis.   I have personally reviewed the patients imaging data  as summarized above.  I have reviewed the natural history of aortic stenosis with the patient and family members who are present today. We have discussed the limitations of medical therapy and the poor prognosis associated with symptomatic aortic stenosis. We have also reviewed potential treatment options, including palliative medical therapy, conventional surgical aortic valve replacement, and transcatheter aortic valve replacement. We discussed treatment options in the context of this patient's specific comorbid medical conditions.   All of the patient's questions were answered today. Will make further recommendations based on the results of studies outlined above.   Total time spent with patient today 40 minutes. This includes reviewing records, evaluating the patient and coordinating care.   Orbie Pyo, MD  11/20/2022 4:06 PM    [facility] [address] [place], [place], [place]  [address] Phone: [phone]; Fax: [phone]

## 2022-11-21 LAB — CBC
Hematocrit: 40.3 % (ref 34.0–46.6)
Hemoglobin: 13.1 g/dL (ref 11.1–15.9)
MCH: 29.1 pg (ref 26.6–33.0)
MCHC: 32.5 g/dL (ref 31.5–35.7)
MCV: 90 fL (ref 79–97)
Platelets: 170 10*3/uL (ref 150–450)
RBC: 4.5 x10E6/uL (ref 3.77–5.28)
RDW: 12.9 % (ref 11.7–15.4)
WBC: 8 10*3/uL (ref 3.4–10.8)

## 2022-11-21 LAB — BASIC METABOLIC PANEL
BUN/Creatinine Ratio: 22 (ref 12–28)
BUN: 16 mg/dL (ref 8–27)
CO2: 25 mmol/L (ref 20–29)
Calcium: 9.2 mg/dL (ref 8.7–10.3)
Chloride: 103 mmol/L (ref 96–106)
Creatinine, Ser: 0.73 mg/dL (ref 0.57–1.00)
Glucose: 93 mg/dL (ref 70–99)
Potassium: 4.2 mmol/L (ref 3.5–5.2)
Sodium: 141 mmol/L (ref 134–144)
eGFR: 88 mL/min/{1.73_m2} (ref >59)

## 2022-11-22 ENCOUNTER — Other Ambulatory Visit (HOSPITAL_COMMUNITY): Payer: Self-pay

## 2022-11-22 ENCOUNTER — Other Ambulatory Visit: Payer: Self-pay | Admitting: Physician Assistant

## 2022-11-22 ENCOUNTER — Encounter: Payer: Self-pay | Admitting: Physician Assistant

## 2022-11-22 MED ORDER — METOPROLOL TARTRATE 50 MG PO TABS
50.0000 mg | ORAL_TABLET | ORAL | 0 refills | Status: DC
  Filled 2022-11-22: qty 1, 1d supply, fill #0

## 2022-11-25 ENCOUNTER — Encounter (HOSPITAL_COMMUNITY)
Admission: RE | Disposition: A | Discharge status: Home / Self Care | Payer: Self-pay | Source: Ambulatory Visit | Attending: Internal Medicine

## 2022-11-25 ENCOUNTER — Other Ambulatory Visit: Payer: Self-pay

## 2022-11-25 ENCOUNTER — Ambulatory Visit (HOSPITAL_COMMUNITY)
Admission: RE | Admit: 2022-11-25 | Discharge: 2022-11-25 | Disposition: A | Discharge status: Home / Self Care | Payer: Medicare Other | Source: Ambulatory Visit | Attending: Internal Medicine | Admitting: Internal Medicine

## 2022-11-25 DIAGNOSIS — E663 Overweight: Secondary | ICD-10-CM | POA: Insufficient documentation

## 2022-11-25 DIAGNOSIS — I350 Nonrheumatic aortic (valve) stenosis: Secondary | ICD-10-CM | POA: Diagnosis present

## 2022-11-25 DIAGNOSIS — Z6841 Body Mass Index (BMI) 40.0 and over, adult: Secondary | ICD-10-CM | POA: Insufficient documentation

## 2022-11-25 DIAGNOSIS — I10 Essential (primary) hypertension: Secondary | ICD-10-CM | POA: Diagnosis not present

## 2022-11-25 DIAGNOSIS — I2510 Atherosclerotic heart disease of native coronary artery without angina pectoris: Secondary | ICD-10-CM | POA: Insufficient documentation

## 2022-11-25 HISTORY — PX: RIGHT HEART CATH AND CORONARY ANGIOGRAPHY: CATH118264

## 2022-11-25 LAB — POCT I-STAT EG7
Acid-Base Excess: 0 mmol/L (ref 0.0–2.0)
Acid-base deficit: 8 mmol/L — ABNORMAL HIGH (ref 0.0–2.0)
Bicarbonate: 19 mmol/L — ABNORMAL LOW (ref 20.0–28.0)
Bicarbonate: 26.1 mmol/L (ref 20.0–28.0)
Calcium, Ion: 0.82 mmol/L — CL (ref 1.15–1.40)
Calcium, Ion: 1.24 mmol/L (ref 1.15–1.40)
HCT: 32 % — ABNORMAL LOW (ref 36.0–46.0)
HCT: 37 % (ref 36.0–46.0)
Hemoglobin: 10.9 g/dL — ABNORMAL LOW (ref 12.0–15.0)
Hemoglobin: 12.6 g/dL (ref 12.0–15.0)
O2 Saturation: 73 %
O2 Saturation: 77 %
Potassium: 2.8 mmol/L — ABNORMAL LOW (ref 3.5–5.1)
Potassium: 3.8 mmol/L (ref 3.5–5.1)
Sodium: 134 mmol/L — ABNORMAL LOW (ref 135–145)
Sodium: 140 mmol/L (ref 135–145)
TCO2: 20 mmol/L — ABNORMAL LOW (ref 22–32)
TCO2: 28 mmol/L (ref 22–32)
pCO2, Ven: 42.8 mmHg — ABNORMAL LOW (ref 44–60)
pCO2, Ven: 47.3 mmHg (ref 44–60)
pH, Ven: 7.256 (ref 7.25–7.43)
pH, Ven: 7.349 (ref 7.25–7.43)
pO2, Ven: 44 mmHg (ref 32–45)
pO2, Ven: 45 mmHg (ref 32–45)

## 2022-11-25 LAB — POCT I-STAT 7, (LYTES, BLD GAS, ICA,H+H)
Acid-Base Excess: 0 mmol/L (ref 0.0–2.0)
Bicarbonate: 25.9 mmol/L (ref 20.0–28.0)
Calcium, Ion: 1.25 mmol/L (ref 1.15–1.40)
HCT: 38 % (ref 36.0–46.0)
Hemoglobin: 12.9 g/dL (ref 12.0–15.0)
O2 Saturation: 92 %
Potassium: 3.9 mmol/L (ref 3.5–5.1)
Sodium: 139 mmol/L (ref 135–145)
TCO2: 27 mmol/L (ref 22–32)
pCO2 arterial: 45.2 mmHg (ref 32–48)
pH, Arterial: 7.366 (ref 7.35–7.45)
pO2, Arterial: 66 mmHg — ABNORMAL LOW (ref 83–108)

## 2022-11-25 SURGERY — RIGHT HEART CATH AND CORONARY ANGIOGRAPHY
Anesthesia: LOCAL

## 2022-11-25 MED ORDER — ACETAMINOPHEN 325 MG PO TABS: 650.0000 mg | ORAL_TABLET | ORAL | Status: DC | PRN

## 2022-11-25 MED ORDER — LIDOCAINE HCL (PF) 1 % IJ SOLN
INTRAMUSCULAR | Status: AC
  Filled 2022-11-25: qty 30

## 2022-11-25 MED ORDER — ONDANSETRON HCL 4 MG/2ML IJ SOLN: 4.0000 mg | Freq: Four times a day (QID) | INTRAMUSCULAR | Status: DC | PRN

## 2022-11-25 MED ORDER — HEPARIN (PORCINE) IN NACL 1000-0.9 UT/500ML-% IV SOLN
INTRAVENOUS | Status: DC | PRN
  Administered 2022-11-25 (×2): 500 mL

## 2022-11-25 MED ORDER — SODIUM CHLORIDE 0.9 % IV SOLN: 250.0000 mL | INTRAVENOUS | Status: DC | PRN

## 2022-11-25 MED ORDER — VERAPAMIL HCL 2.5 MG/ML IV SOLN
INTRAVENOUS | Status: AC
  Filled 2022-11-25: qty 2

## 2022-11-25 MED ORDER — SODIUM CHLORIDE 0.9 % WEIGHT BASED INFUSION
1.0000 mL/kg/h | INTRAVENOUS | Status: DC
Start: 2022-11-25 — End: 2022-11-25

## 2022-11-25 MED ORDER — MIDAZOLAM HCL 2 MG/2ML IJ SOLN
INTRAMUSCULAR | Status: DC | PRN
  Administered 2022-11-25: 1 mg via INTRAVENOUS

## 2022-11-25 MED ORDER — SODIUM CHLORIDE 0.9 % WEIGHT BASED INFUSION
3.0000 mL/kg/h | INTRAVENOUS | Status: DC
Start: 2022-11-25 — End: 2022-11-25
  Administered 2022-11-25: 3 mL/kg/h via INTRAVENOUS

## 2022-11-25 MED ORDER — SODIUM CHLORIDE 0.9% FLUSH: 3.0000 mL | Freq: Two times a day (BID) | INTRAVENOUS | Status: DC

## 2022-11-25 MED ORDER — HYDRALAZINE HCL 20 MG/ML IJ SOLN: 10.0000 mg | INTRAMUSCULAR | Status: DC | PRN

## 2022-11-25 MED ORDER — FENTANYL CITRATE (PF) 100 MCG/2ML IJ SOLN
INTRAMUSCULAR | Status: AC
  Filled 2022-11-25: qty 2

## 2022-11-25 MED ORDER — SODIUM CHLORIDE 0.9% FLUSH: 3.0000 mL | INTRAVENOUS | Status: DC | PRN

## 2022-11-25 MED ORDER — FENTANYL CITRATE (PF) 100 MCG/2ML IJ SOLN
INTRAMUSCULAR | Status: DC | PRN
  Administered 2022-11-25: 25 ug via INTRAVENOUS

## 2022-11-25 MED ORDER — HEPARIN SODIUM (PORCINE) 1000 UNIT/ML IJ SOLN
INTRAMUSCULAR | Status: DC | PRN
  Administered 2022-11-25: 5000 [IU] via INTRA_ARTERIAL

## 2022-11-25 MED ORDER — VERAPAMIL HCL 2.5 MG/ML IV SOLN
INTRAVENOUS | Status: DC | PRN
  Administered 2022-11-25: 10 mL via INTRA_ARTERIAL

## 2022-11-25 MED ORDER — LABETALOL HCL 5 MG/ML IV SOLN: 10.0000 mg | INTRAVENOUS | Status: DC | PRN

## 2022-11-25 MED ORDER — HEPARIN SODIUM (PORCINE) 1000 UNIT/ML IJ SOLN
INTRAMUSCULAR | Status: AC
  Filled 2022-11-25: qty 10

## 2022-11-25 MED ORDER — MIDAZOLAM HCL 2 MG/2ML IJ SOLN
INTRAMUSCULAR | Status: AC
  Filled 2022-11-25: qty 2

## 2022-11-25 MED ORDER — IOHEXOL 350 MG/ML SOLN
INTRAVENOUS | Status: DC | PRN
  Administered 2022-11-25: 80 mL

## 2022-11-25 MED ORDER — LIDOCAINE HCL (PF) 1 % IJ SOLN
INTRAMUSCULAR | Status: DC | PRN
  Administered 2022-11-25: 10 mL

## 2022-11-25 MED ORDER — ASPIRIN 81 MG PO CHEW: 81.0000 mg | CHEWABLE_TABLET | ORAL | Status: DC

## 2022-11-25 SURGICAL SUPPLY — 13 items
CATH BALLN WEDGE 5F 110CM (CATHETERS) IMPLANT
CATH DIAG 6FR JR4 (CATHETERS) IMPLANT
CATH OPTITORQUE TIG 4.0 6F (CATHETERS) IMPLANT
DEVICE RAD COMP TR BAND LRG (VASCULAR PRODUCTS) IMPLANT
GLIDESHEATH SLEND SS 6F .021 (SHEATH) IMPLANT
GUIDEWIRE .025 260CM (WIRE) IMPLANT
KIT HEART LEFT (KITS) ×1 IMPLANT
PACK CARDIAC CATHETERIZATION (CUSTOM PROCEDURE TRAY) ×1 IMPLANT
SHEATH GLIDE SLENDER 4/5FR (SHEATH) IMPLANT
SYR MEDRAD MARK 7 150ML (SYRINGE) ×1 IMPLANT
TRANSDUCER W/STOPCOCK (MISCELLANEOUS) ×1 IMPLANT
TUBING CIL FLEX 10 FLL-RA (TUBING) ×1 IMPLANT
WIRE EMERALD 3MM-J .035X260CM (WIRE) IMPLANT

## 2022-11-25 NOTE — Progress Notes (Signed)
TR BAND REMOVAL  LOCATION:    right radial  DEFLATED PER PROTOCOL:    Yes.    TIME BAND OFF / DRESSING APPLIED:    1315 gauze dressing applied   SITE UPON ARRIVAL:    Level 0  SITE AFTER BAND REMOVAL:    Level 0  CIRCULATION SENSATION AND MOVEMENT:    Within Normal Limits   Yes.    COMMENTS:   no issues noted  

## 2022-11-25 NOTE — Discharge Instructions (Signed)

## 2022-11-25 NOTE — Interval H&P Note (Signed)
History and Physical Interval Note:  11/25/2022 8:40 AM  Charlene Tapia  has presented today for surgery, with the diagnosis of aortic stenosis.  The various methods of treatment have been discussed with the patient and family. After consideration of risks, benefits and other options for treatment, the patient has consented to  Procedure(s): RIGHT/LEFT HEART CATH AND CORONARY ANGIOGRAPHY (N/A) as a surgical intervention.  The patient's history has been reviewed, patient examined, no change in status, stable for surgery.  I have reviewed the patient's chart and labs.  Questions were answered to the patient's satisfaction.     Orbie Pyo

## 2022-11-26 ENCOUNTER — Ambulatory Visit: Payer: Medicare Other | Admitting: Cardiology

## 2022-11-26 ENCOUNTER — Encounter (HOSPITAL_COMMUNITY): Payer: Self-pay | Admitting: Internal Medicine

## 2022-12-02 ENCOUNTER — Ambulatory Visit (HOSPITAL_COMMUNITY)
Admission: RE | Admit: 2022-12-02 | Discharge: 2022-12-02 | Disposition: A | Discharge status: Home / Self Care | Payer: Medicare Other | Source: Ambulatory Visit | Attending: Internal Medicine | Admitting: Internal Medicine

## 2022-12-02 DIAGNOSIS — I350 Nonrheumatic aortic (valve) stenosis: Secondary | ICD-10-CM | POA: Insufficient documentation

## 2022-12-02 MED ORDER — IOHEXOL 350 MG/ML SOLN
100.0000 mL | Freq: Once | INTRAVENOUS | Status: AC | PRN
  Administered 2022-12-02: 100 mL via INTRAVENOUS

## 2022-12-04 ENCOUNTER — Other Ambulatory Visit (HOSPITAL_COMMUNITY): Payer: Self-pay

## 2022-12-08 NOTE — Progress Notes (Unsigned)
[address]       [place] [address]             [phone]           Ronnald Nian [facility] Medical Record #[id number] Date of Birth: 1950-11-24  Pricilla Riffle, MD Elenore Paddy, NP  Chief Complaint:  aortic stenosis   History of Present Illness:     72 yo female who was found to have a murmur by PCP and echo with severe AS with a mean gradient of with normal LV function. Pt is quite active as she puts it at work but has not been so at home. She says she was fit when she was younger and now with her weight gain has noticed herself to limit her activity secondary to becoming tired and with some SOB. No CP or Light headedness. She was evaluated by Cardiology and felt to require AVR and had Cath without CAD and CTA which has acceptable anatomy for TAVR with femoral/subclavian access. Pt reports that she has done a lot of research on these valves and she feels she would most like to have a TAVR over a SAVR      Past Medical History:  Diagnosis Date   Allergy    Heart murmur 11/12/2021   High cholesterol    HTN (hypertension) 10/18/2022   Osteoarthritis    "knees; thumbs" (10/01/2013)    Past Surgical History:  Procedure Laterality Date   GANGLION CYST EXCISION Right 01/18/1989   INGUINAL HERNIA REPAIR Bilateral 1954   "age 64 month"   JOINT REPLACEMENT     RIGHT HEART CATH AND CORONARY ANGIOGRAPHY N/A 11/25/2022   Procedure: RIGHT HEART CATH AND CORONARY ANGIOGRAPHY;  Surgeon: Orbie Pyo, MD;  Location: [facility] INVASIVE CV LAB;  Service: Cardiovascular;  Laterality: N/A;   TOTAL KNEE ARTHROPLASTY Left 08/16/2013   Procedure: TOTAL KNEE ARTHROPLASTY;  Surgeon: Nestor Lewandowsky, MD;  Location: [facility] OR;  Service: Orthopedics;  Laterality: Left;   TOTAL KNEE ARTHROPLASTY Right 09/29/2013   TOTAL KNEE ARTHROPLASTY Right 09/29/2013   Procedure: TOTAL KNEE ARTHROPLASTY;  Surgeon: Nestor Lewandowsky, MD;  Location: [facility] OR;  Service: Orthopedics;  Laterality:  Right;    Social History   Tobacco Use  Smoking Status Never  Smokeless Tobacco Never    Social History   Substance and Sexual Activity  Alcohol Use Yes   Comment: Social drinking wine only    Social History   Socioeconomic History   Marital status: Married    Spouse name: Not on file   Number of children: Not on file   Years of education: Not on file   Highest education level: Not on file  Occupational History   Not on file  Tobacco Use   Smoking status: Never   Smokeless tobacco: Never  Substance and Sexual Activity   Alcohol use: Yes    Comment: Social drinking wine only   Drug use: No   Sexual activity: Not Currently    Comment: 15 years of age/spouse w/ ED  Other Topics Concern   Not on file  Social History Narrative   Not on file   Social Determinants of Health   Financial Resource Strain: Not on file  Food Insecurity: No Food Insecurity (09/25/2021)   Hunger Vital Sign    Worried About Running Out of Food in the Last Year: Never true    Ran Out of Food in the Last Year:  Never true  Transportation Needs: Not on file  Physical Activity: Not on file  Stress: Not on file  Social Connections: Not on file  Intimate Partner Violence: Not on file    Allergies  Allergen Reactions   Phisohex [Hexachlorophene] Rash    Gets blisters on skin     Current Outpatient Medications  Medication Sig Dispense Refill   amLODipine (NORVASC) 5 MG tablet Take 1 tablet (5 mg total) by mouth daily. 90 tablet 1   Clobetasol Prop Emollient Base (CLOBETASOL PROPIONATE E) 0.05 % emollient cream Apply to affected area 2 (two) times daily. (Patient taking differently: Apply 1 Application topically 2 (two) times daily as needed (psoriasis).) 30 g 2   fluticasone (FLONASE) 50 MCG/ACT nasal spray Place 2 sprays into both nostrils daily as needed for allergies or rhinitis.     ibuprofen (ADVIL) 200 MG tablet Take 400 mg by mouth every 8 (eight) hours as needed for mild pain or  moderate pain.     metoprolol tartrate (LOPRESSOR) 50 MG tablet Take 1 tablet (50 mg total) by mouth as directed for 1 dose. 1 tablet 0   Multiple Vitamins-Minerals (MULTIVITAMIN GUMMIES WOMENS PO) Take 2 each by mouth daily.     No current facility-administered medications for this visit.     Family History  Problem Relation Age of Onset   Heart disease Father    Hypertension Father    Alcohol abuse Father        Physical Exam: Teeth in good repair Obese Lungs: clear Card: RR with 2/6 sem Ext no edema Neuro: alert and no focal deficits     Diagnostic Studies & Laboratory data: I have personally reviewed the following studies and agree with the findings   TTE (11/2022) IMPRESSIONS     1. Left ventricular ejection fraction, by estimation, is 60 to 65%. The  left ventricle has normal function. The left ventricle has no regional  wall motion abnormalities. There is mild left ventricular hypertrophy.  Left ventricular diastolic parameters  are consistent with Grade II diastolic dysfunction (pseudonormalization).   2. Right ventricular systolic function is normal. The right ventricular  size is mildly enlarged. There is mildly elevated pulmonary artery  systolic pressure.   3. Left atrial size was moderately dilated.   4. Right atrial size was mildly dilated.   5. The mitral valve is degenerative. Mild mitral valve regurgitation.  Moderate mitral stenosis. The mean mitral valve gradient is 6.0 mmHg.   6. AV area may be overestimated based on inaccurate LVOT diameter. The  aortic valve is calcified. Aortic valve regurgitation is moderate. Severe  aortic valve stenosis. Aortic valve mean gradient measures 49.0 mmHg.  Aortic valve Vmax measures 4.49 m/s.   7. There is mild dilatation of the ascending aorta, measuring 40 mm.   8. The inferior vena cava is normal in size with greater than 50%  respiratory variability, suggesting right atrial pressure of 3 mmHg.    Conclusion(s)/Recommendation(s): Calcified aortic valve with severe AS,  consider a strucutral heart consult.   FINDINGS   Left Ventricle: Left ventricular ejection fraction, by estimation, is 60  to 65%. The left ventricle has normal function. The left ventricle has no  regional wall motion abnormalities. The left ventricular internal cavity  size was normal in size. There is   mild left ventricular hypertrophy. Left ventricular diastolic parameters  are consistent with Grade II diastolic dysfunction (pseudonormalization).   Right Ventricle: The right ventricular size is mildly enlarged. Right  ventricular systolic function is normal. There is mildly elevated  pulmonary artery systolic pressure. The tricuspid regurgitant velocity is  3.06 m/s, and with an assumed right atrial  pressure of 3 mmHg, the estimated right ventricular systolic pressure is  40.5 mmHg.   Left Atrium: Left atrial size was moderately dilated.   Right Atrium: Right atrial size was mildly dilated.   Pericardium: There is no evidence of pericardial effusion.   Mitral Valve: The mitral valve is degenerative in appearance. Mild mitral  valve regurgitation. Moderate mitral valve stenosis. MV peak gradient,  11.7 mmHg. The mean mitral valve gradient is 6.0 mmHg.   Tricuspid Valve: The tricuspid valve is normal in structure. Tricuspid  valve regurgitation is mild.   Aortic Valve: AV area may be overestimated based on inaccurate LVOT  diameter. The aortic valve is calcified. Aortic valve regurgitation is  moderate. Aortic regurgitation PHT measures 380 msec. Severe aortic  stenosis is present. Aortic valve mean gradient   measures 49.0 mmHg. Aortic valve peak gradient measures 80.6 mmHg. Aortic  valve area, by VTI measures 1.10 cm.   Pulmonic Valve: Pulmonic valve regurgitation is not visualized.   Aorta: There is mild dilatation of the ascending aorta, measuring 40 mm.   Venous: The inferior vena cava is  normal in size with greater than 50%  respiratory variability, suggesting right atrial pressure of 3 mmHg.   IAS/Shunts: No atrial level shunt detected by color flow Doppler.     LEFT VENTRICLE  PLAX 2D  LVIDd:         5.00 cm   Diastology  LVIDs:         2.90 cm   LV e' medial:    5.19 cm/s  LV PW:         1.10 cm   LV E/e' medial:  30.8  LV IVS:        1.10 cm   LV e' lateral:   6.89 cm/s  LVOT diam:     2.20 cm   LV E/e' lateral: 23.2  LV SV:         109  LV SV Index:   50  LVOT Area:     3.80 cm                             3D Volume EF:                           3D EF:        58 %                           LV EDV:       184 ml                           LV ESV:       77 ml                           LV SV:        107 ml   RIGHT VENTRICLE  RV Basal diam:  4.80 cm  RV Mid diam:    3.30 cm  RV S prime:     16.40 cm/s  TAPSE (M-mode): 2.3 cm   LEFT ATRIUM  Index        RIGHT ATRIUM           Index  LA diam:        4.30 cm  1.97 cm/m   RA Area:     16.60 cm  LA Vol (A2C):   95.0 ml  43.42 ml/m  RA Volume:   42.30 ml  19.33 ml/m  LA Vol (A4C):   94.4 ml  43.14 ml/m  LA Biplane Vol: 104.0 ml 47.53 ml/m   AORTIC VALVE  AV Area (Vmax):    1.09 cm  AV Area (Vmean):   1.02 cm  AV Area (VTI):     1.10 cm  AV Vmax:           449.00 cm/s  AV Vmean:          332.000 cm/s  AV VTI:            0.990 m  AV Peak Grad:      80.6 mmHg  AV Mean Grad:      49.0 mmHg  LVOT Vmax:         129.00 cm/s  LVOT Vmean:        88.700 cm/s  LVOT VTI:          0.286 m  LVOT/AV VTI ratio: 0.29  AI PHT:            380 msec    AORTA  Ao Root diam: 3.50 cm  Ao Asc diam:  4.00 cm   MITRAL VALVE                TRICUSPID VALVE  MV Area (PHT): 3.12 cm     TR Peak grad:   37.5 mmHg  MV Area VTI:   2.44 cm     TR Vmax:        306.00 cm/s  MV Peak grad:  11.7 mmHg  MV Mean grad:  6.0 mmHg     SHUNTS  MV Vmax:       1.71 m/s     Systemic VTI:  0.29 m  MV Vmean:      122.0 cm/s    Systemic Diam: 2.20 cm  MV Decel Time: 243 msec  MV E velocity: 160.00 cm/s  MV A velocity: 165.00 cm/s  MV E/A ratio:  0.97   CATH (11/2022) Conclusion  1.  Minimal obstructive coronary artery disease. 2.  Fick cardiac output 10.8 liters per minute and Fick cardiac index of 5.0 liters per minute per square meter squared with the following hemodynamics:   Right atrial pressure mean 7 mmHg Right ventricular pressure 44/5 with an end-diastolic pressure of 15 mmHg Wedge pressure mean 18 mmHg Pulmonary pressure 41/18 with a mean of 28 mmHg Pulmonary vascular resistance of 1.2 Woods units    Recent Radiology Findings:   CTA (11/2022) FINDINGS: Aortic Valve: Severely thickened tri-leaflet aortic valve with heavy calcification and reduced excursion the planimeter valve area is 1.04 Sq cm consistent with moderate to severe aortic stenosis   Annular calcification: Severe with multiple calcifications and LVOT calcification.   Aortic Valve Calcium Score: 4960   Presence of basal septal hypertrophy: Yes, 18 mm   Systolic phase measurements are greater than diastolic phase imaging   Perimembranous septal diameter: 6 mm   Mitral Valve: Severe calcification with MAC and caseous posterior MAC and intra-valvular fibrosa calcification   Aortic Annulus Measurements- 25%   Major annulus diameter: 26 mm   Minor annulus diameter:23  mm   Annular perimeter: 78 mm   Annular area: 4.67 cm2.   Aortic Root Measurements- 75%   Sinotubular Junction: 31 mm   Ascending Thoracic Aorta: 37 mm   Aortic Arch: 26 mm   Descending Thoracic Aorta: 24 mm   Sinus of Valsalva Measurements:   Right coronary cusp width: 32 mm   Left coronary cusp width: 33 mm   Non coronary cusp width: 33 mm   Coronary Artery Height above Annulus:   Left Main: 15 mm   Left SoV height: 23 mm   Right Coronary: 20 mm   Right SoV height: 23 mm   Optimum Fluoroscopic Angle for Delivery: LAO 2, CAU 2    Valves for structural team consideration: 26 mm Sapien Valve vs 29 mm Evolut Valve   Non TAVR Valve Findings:   Coronary Arteries: Normal coronary origin. Study not completed with nitroglycerin.   Coronary Calcium Score:   Left main: 0   Left anterior descending artery: 43   Left circumflex artery: 0   Right coronary artery: 33   Total: 76   Percentile: 69th for age, sex, and race matched control.   Systemic veins: Normal anatomy   Main Pulmonary artery: Dilated 30 mm   Pulmonary veins: Normal anatomy   Left atrial appendage: Patent   Interatrial septum: No communications   Left ventricle: Normal size   Left atrium: Dilated   Right ventricle: Normal size   Right atrium: Normal size   Pericardium: No calcifications   Extra Cardiac Findings as per separate reporting.   Notable artifacts: Poor contrast opacification and body attenuation artifact; study is interpretable for measurements   IMPRESSION: 1. Moderate to severe aortic stenosis. Findings pertinent to TAVR procedure are detailed above.    Recent Lab Findings: Lab Results  Component Value Date   WBC 8.0 11/20/2022   HGB 10.9 (L) 11/25/2022   HCT 32.0 (L) 11/25/2022   PLT 170 11/20/2022   GLUCOSE 93 11/20/2022   CHOL 184 10/24/2022   TRIG 88.0 10/24/2022   HDL 48.30 10/24/2022   LDLCALC 119 (H) 10/24/2022   ALT 17 11/12/2021   AST 16 11/12/2021   NA 134 (L) 11/25/2022   K 2.8 (L) 11/25/2022   CL 103 11/20/2022   CREATININE 0.73 11/20/2022   BUN 16 11/20/2022   CO2 25 11/20/2022   TSH 0.99 11/12/2021   INR 0.97 09/22/2013   HGBA1C 5.8 10/24/2022      Assessment / Plan:     71 yo female with NYHA class I symptoms of severe AS with normal LV fucntion and no CAD. We had a very good discussion of the natural history of AS and the need for AVR when becoming symptomatic. She understands the issues of the different procedures of TAVR vs SAVR and the reasons why we prefer pts to have either  and she understands. I told her the only real negative aspect of her having SAVR was her obesity but overall she chooses to undergo TAVR. I agree with Dr Lynnette Caffey on the femoral access and that a 29 evolute would be appropriate and that she would be a bailout candidate if needed   I have spent 60 min in review of the records, viewing studies and in face to face with patient and in coordination of future care    [name] 12/08/2022 7:56 PM

## 2022-12-09 ENCOUNTER — Encounter: Payer: Self-pay | Admitting: Thoracic Surgery (Cardiothoracic Vascular Surgery)

## 2022-12-09 ENCOUNTER — Institutional Professional Consult (permissible substitution) (INDEPENDENT_AMBULATORY_CARE_PROVIDER_SITE_OTHER): Payer: Medicare Other | Admitting: Thoracic Surgery (Cardiothoracic Vascular Surgery)

## 2022-12-09 VITALS — BP 149/69 | HR 82 | Resp 18 | Ht 62.0 in | Wt 269.0 lb

## 2022-12-09 DIAGNOSIS — I350 Nonrheumatic aortic (valve) stenosis: Secondary | ICD-10-CM | POA: Diagnosis not present

## 2022-12-09 NOTE — Patient Instructions (Signed)
TAVR to be scheduled over the next month or so

## 2022-12-19 ENCOUNTER — Ambulatory Visit: Payer: Medicare Other | Admitting: Nurse Practitioner

## 2023-01-13 ENCOUNTER — Telehealth: Payer: Self-pay | Admitting: Internal Medicine

## 2023-01-13 DIAGNOSIS — Z0279 Encounter for issue of other medical certificate: Secondary | ICD-10-CM

## 2023-01-13 NOTE — Telephone Encounter (Signed)
Forms and payment were received on ___8/26/2024__ from patient. Placed in "MD's box."

## 2023-01-24 ENCOUNTER — Other Ambulatory Visit: Payer: Self-pay

## 2023-01-24 DIAGNOSIS — I350 Nonrheumatic aortic (valve) stenosis: Secondary | ICD-10-CM

## 2023-01-30 NOTE — Telephone Encounter (Signed)
Left a message for the patient that I was returning her call and will send an update through the portal.  She can call back or reply to the message, whichever is more convenient.

## 2023-01-30 NOTE — Telephone Encounter (Signed)
Patient is following up requesting updates as she has not heard back regarding FMLA forms paid for on 8/26.

## 2023-01-31 ENCOUNTER — Encounter (HOSPITAL_COMMUNITY)
Admission: RE | Admit: 2023-01-31 | Discharge: 2023-01-31 | Disposition: A | Discharge status: Home / Self Care | Payer: Medicare Other | Source: Ambulatory Visit | Attending: Internal Medicine | Admitting: Internal Medicine

## 2023-01-31 ENCOUNTER — Other Ambulatory Visit: Payer: Self-pay

## 2023-01-31 ENCOUNTER — Ambulatory Visit (HOSPITAL_COMMUNITY)
Admission: RE | Admit: 2023-01-31 | Discharge: 2023-01-31 | Disposition: A | Discharge status: Home / Self Care | Payer: Medicare Other | Source: Ambulatory Visit | Attending: Internal Medicine

## 2023-01-31 DIAGNOSIS — Z01818 Encounter for other preprocedural examination: Secondary | ICD-10-CM | POA: Diagnosis present

## 2023-01-31 DIAGNOSIS — Z1152 Encounter for screening for COVID-19: Secondary | ICD-10-CM | POA: Diagnosis not present

## 2023-01-31 DIAGNOSIS — I350 Nonrheumatic aortic (valve) stenosis: Secondary | ICD-10-CM | POA: Insufficient documentation

## 2023-01-31 LAB — COMPREHENSIVE METABOLIC PANEL
ALT: 21 U/L (ref 0–44)
AST: 21 U/L (ref 15–41)
Albumin: 3.9 g/dL (ref 3.5–5.0)
Alkaline Phosphatase: 59 U/L (ref 38–126)
Anion gap: 7 (ref 5–15)
BUN: 15 mg/dL (ref 8–23)
CO2: 28 mmol/L (ref 22–32)
Calcium: 9.2 mg/dL (ref 8.9–10.3)
Chloride: 102 mmol/L (ref 98–111)
Creatinine, Ser: 0.64 mg/dL (ref 0.44–1.00)
GFR, Estimated: >60 mL/min (ref >60)
Glucose, Bld: 108 mg/dL — ABNORMAL HIGH (ref 70–99)
Potassium: 3.9 mmol/L (ref 3.5–5.1)
Sodium: 137 mmol/L (ref 135–145)
Total Bilirubin: 0.8 mg/dL (ref 0.3–1.2)
Total Protein: 6.9 g/dL (ref 6.5–8.1)

## 2023-01-31 LAB — CBC
HCT: 42.4 % (ref 36.0–46.0)
Hemoglobin: 13.4 g/dL (ref 12.0–15.0)
MCH: 29.3 pg (ref 26.0–34.0)
MCHC: 31.6 g/dL (ref 30.0–36.0)
MCV: 92.6 fL (ref 80.0–100.0)
Platelets: 158 10*3/uL (ref 150–400)
RBC: 4.58 MIL/uL (ref 3.87–5.11)
RDW: 13.3 % (ref 11.5–15.5)
WBC: 6 10*3/uL (ref 4.0–10.5)
nRBC: 0 % (ref 0.0–0.2)

## 2023-01-31 LAB — URINALYSIS, ROUTINE W REFLEX MICROSCOPIC
Bilirubin Urine: NEGATIVE
Glucose, UA: NEGATIVE mg/dL
Hgb urine dipstick: NEGATIVE
Ketones, ur: NEGATIVE mg/dL
Leukocytes,Ua: NEGATIVE
Nitrite: NEGATIVE
Protein, ur: NEGATIVE mg/dL
Specific Gravity, Urine: 1.004 — ABNORMAL LOW (ref 1.005–1.030)
pH: 6 (ref 5.0–8.0)

## 2023-01-31 LAB — SURGICAL PCR SCREEN
MRSA, PCR: NEGATIVE
Staphylococcus aureus: NEGATIVE

## 2023-01-31 LAB — PROTIME-INR
INR: 1 (ref 0.8–1.2)
Prothrombin Time: 13.5 s (ref 11.4–15.2)

## 2023-01-31 LAB — TYPE AND SCREEN
ABO/RH(D): O POS
Antibody Screen: NEGATIVE

## 2023-01-31 NOTE — Progress Notes (Signed)
Patient signed all consents at PAT lab appointment. CHG soap and instructions were given to patient. CHG surgical prep reviewed with patient and all questions answered.  Patients chart send to anesthesia for review.  

## 2023-02-01 LAB — SARS CORONAVIRUS 2 (TAT 6-24 HRS): SARS Coronavirus 2: NEGATIVE

## 2023-02-03 MED ORDER — POTASSIUM CHLORIDE 2 MEQ/ML IV SOLN
80.0000 meq | INTRAVENOUS | Status: DC
  Filled 2023-02-03 (×2): qty 40

## 2023-02-03 MED ORDER — HEPARIN 30,000 UNITS/1000 ML (OHS) CELLSAVER SOLUTION
Status: DC
  Filled 2023-02-03 (×2): qty 1000

## 2023-02-03 MED ORDER — CEFAZOLIN IN SODIUM CHLORIDE 3-0.9 GM/100ML-% IV SOLN
3.0000 g | INTRAVENOUS | Status: AC
  Administered 2023-02-04: 3 g via INTRAVENOUS
  Filled 2023-02-03 (×3): qty 100

## 2023-02-03 MED ORDER — MAGNESIUM SULFATE 50 % IJ SOLN
40.0000 meq | INTRAMUSCULAR | Status: DC
  Filled 2023-02-03 (×2): qty 9.85

## 2023-02-03 MED ORDER — NOREPINEPHRINE 4 MG/250ML-% IV SOLN
0.0000 ug/min | INTRAVENOUS | Status: AC
  Administered 2023-02-04: 1 ug/min via INTRAVENOUS
  Filled 2023-02-03: qty 250

## 2023-02-03 MED ORDER — DEXMEDETOMIDINE HCL IN NACL 400 MCG/100ML IV SOLN
0.1000 ug/kg/h | INTRAVENOUS | Status: AC
  Administered 2023-02-04: 1 ug/kg/h via INTRAVENOUS
  Filled 2023-02-03: qty 100

## 2023-02-03 NOTE — H&P (Signed)
[address]       [place] [address]             [phone]                                   Ronnald Nian [facility] Medical Record #[id number] Date of Birth: 11-13-1950   Pricilla Riffle, MD Elenore Paddy, NP   Chief Complaint:  aortic stenosis    History of Present Illness:     72 yo female who was found to have a murmur by PCP and echo with severe AS with a mean gradient of with normal LV function. Pt is quite active as she puts it at work but has not been so at home. She says she was fit when she was younger and now with her weight gain has noticed herself to limit her activity secondary to becoming tired and with some SOB. No CP or Light headedness. She was evaluated by Cardiology and felt to require AVR and had Cath without CAD and CTA which has acceptable anatomy for TAVR with femoral/subclavian access. Pt reports that she has done a lot of research on these valves and she feels she would most like to have a TAVR over a SAVR             Past Medical History:  Diagnosis Date   Allergy     Heart murmur 11/12/2021   High cholesterol     HTN (hypertension) 10/18/2022   Osteoarthritis      "knees; thumbs" (10/01/2013)               Past Surgical History:  Procedure Laterality Date   GANGLION CYST EXCISION Right 01/18/1989   INGUINAL HERNIA REPAIR Bilateral 1954    "age 45 month"   JOINT REPLACEMENT       RIGHT HEART CATH AND CORONARY ANGIOGRAPHY N/A 11/25/2022    Procedure: RIGHT HEART CATH AND CORONARY ANGIOGRAPHY;  Surgeon: Orbie Pyo, MD;  Location: [facility] INVASIVE CV LAB;  Service: Cardiovascular;  Laterality: N/A;   TOTAL KNEE ARTHROPLASTY Left 08/16/2013    Procedure: TOTAL KNEE ARTHROPLASTY;  Surgeon: Nestor Lewandowsky, MD;  Location: [facility] OR;  Service: Orthopedics;  Laterality: Left;   TOTAL KNEE ARTHROPLASTY Right 09/29/2013   TOTAL KNEE ARTHROPLASTY Right 09/29/2013    Procedure: TOTAL KNEE ARTHROPLASTY;  Surgeon: Nestor Lewandowsky, MD;   Location: [facility] OR;  Service: Orthopedics;  Laterality: Right;          Tobacco Use History  Social History       Tobacco Use  Smoking Status Never  Smokeless Tobacco Never      Social History        Substance and Sexual Activity  Alcohol Use Yes    Comment: Social drinking wine only      Social History         Socioeconomic History   Marital status: Married      Spouse name: Not on file   Number of children: Not on file   Years of education: Not on file   Highest education level: Not on file  Occupational History   Not on file  Tobacco Use   Smoking status: Never   Smokeless tobacco: Never  Substance and Sexual Activity   Alcohol use: Yes      Comment: Social drinking wine only  Drug use: No   Sexual activity: Not Currently      Comment: 13 years of age/spouse w/ ED  Other Topics Concern   Not on file  Social History Narrative   Not on file    Social Determinants of Health        Financial Resource Strain: Not on file  Food Insecurity: No Food Insecurity (09/25/2021)    Hunger Vital Sign     Worried About Running Out of Food in the Last Year: Never true     Ran Out of Food in the Last Year: Never true  Transportation Needs: Not on file  Physical Activity: Not on file  Stress: Not on file  Social Connections: Not on file  Intimate Partner Violence: Not on file      Allergies       Allergies  Allergen Reactions   Phisohex [Hexachlorophene] Rash      Gets blisters on skin               Current Outpatient Medications  Medication Sig Dispense Refill   amLODipine (NORVASC) 5 MG tablet Take 1 tablet (5 mg total) by mouth daily. 90 tablet 1   Clobetasol Prop Emollient Base (CLOBETASOL PROPIONATE E) 0.05 % emollient cream Apply to affected area 2 (two) times daily. (Patient taking differently: Apply 1 Application topically 2 (two) times daily as needed (psoriasis).) 30 g 2   fluticasone (FLONASE) 50 MCG/ACT nasal spray Place 2 sprays into both nostrils  daily as needed for allergies or rhinitis.       ibuprofen (ADVIL) 200 MG tablet Take 400 mg by mouth every 8 (eight) hours as needed for mild pain or moderate pain.       metoprolol tartrate (LOPRESSOR) 50 MG tablet Take 1 tablet (50 mg total) by mouth as directed for 1 dose. 1 tablet 0   Multiple Vitamins-Minerals (MULTIVITAMIN GUMMIES WOMENS PO) Take 2 each by mouth daily.          No current facility-administered medications for this visit.               Family History  Problem Relation Age of Onset   Heart disease Father     Hypertension Father     Alcohol abuse Father                  Physical Exam: Teeth in good repair Obese Lungs: clear Card: RR with 2/6 sem Ext no edema Neuro: alert and no focal deficits         Diagnostic Studies & Laboratory data: I have personally reviewed the following studies and agree with the findings   TTE (11/2022) IMPRESSIONS     1. Left ventricular ejection fraction, by estimation, is 60 to 65%. The  left ventricle has normal function. The left ventricle has no regional  wall motion abnormalities. There is mild left ventricular hypertrophy.  Left ventricular diastolic parameters  are consistent with Grade II diastolic dysfunction (pseudonormalization).   2. Right ventricular systolic function is normal. The right ventricular  size is mildly enlarged. There is mildly elevated pulmonary artery  systolic pressure.   3. Left atrial size was moderately dilated.   4. Right atrial size was mildly dilated.   5. The mitral valve is degenerative. Mild mitral valve regurgitation.  Moderate mitral stenosis. The mean mitral valve gradient is 6.0 mmHg.   6. AV area may be overestimated based on inaccurate LVOT diameter. The  aortic valve is calcified. Aortic valve  regurgitation is moderate. Severe  aortic valve stenosis. Aortic valve mean gradient measures 49.0 mmHg.  Aortic valve Vmax measures 4.49 m/s.   7. There is mild dilatation of  the ascending aorta, measuring 40 mm.   8. The inferior vena cava is normal in size with greater than 50%  respiratory variability, suggesting right atrial pressure of 3 mmHg.   Conclusion(s)/Recommendation(s): Calcified aortic valve with severe AS,  consider a strucutral heart consult.   FINDINGS   Left Ventricle: Left ventricular ejection fraction, by estimation, is 60  to 65%. The left ventricle has normal function. The left ventricle has no  regional wall motion abnormalities. The left ventricular internal cavity  size was normal in size. There is   mild left ventricular hypertrophy. Left ventricular diastolic parameters  are consistent with Grade II diastolic dysfunction (pseudonormalization).   Right Ventricle: The right ventricular size is mildly enlarged. Right  ventricular systolic function is normal. There is mildly elevated  pulmonary artery systolic pressure. The tricuspid regurgitant velocity is  3.06 m/s, and with an assumed right atrial  pressure of 3 mmHg, the estimated right ventricular systolic pressure is  40.5 mmHg.   Left Atrium: Left atrial size was moderately dilated.   Right Atrium: Right atrial size was mildly dilated.   Pericardium: There is no evidence of pericardial effusion.   Mitral Valve: The mitral valve is degenerative in appearance. Mild mitral  valve regurgitation. Moderate mitral valve stenosis. MV peak gradient,  11.7 mmHg. The mean mitral valve gradient is 6.0 mmHg.   Tricuspid Valve: The tricuspid valve is normal in structure. Tricuspid  valve regurgitation is mild.   Aortic Valve: AV area may be overestimated based on inaccurate LVOT  diameter. The aortic valve is calcified. Aortic valve regurgitation is  moderate. Aortic regurgitation PHT measures 380 msec. Severe aortic  stenosis is present. Aortic valve mean gradient   measures 49.0 mmHg. Aortic valve peak gradient measures 80.6 mmHg. Aortic  valve area, by VTI measures 1.10 cm.    Pulmonic Valve: Pulmonic valve regurgitation is not visualized.   Aorta: There is mild dilatation of the ascending aorta, measuring 40 mm.   Venous: The inferior vena cava is normal in size with greater than 50%  respiratory variability, suggesting right atrial pressure of 3 mmHg.   IAS/Shunts: No atrial level shunt detected by color flow Doppler.     LEFT VENTRICLE  PLAX 2D  LVIDd:         5.00 cm   Diastology  LVIDs:         2.90 cm   LV e' medial:    5.19 cm/s  LV PW:         1.10 cm   LV E/e' medial:  30.8  LV IVS:        1.10 cm   LV e' lateral:   6.89 cm/s  LVOT diam:     2.20 cm   LV E/e' lateral: 23.2  LV SV:         109  LV SV Index:   50  LVOT Area:     3.80 cm                             3D Volume EF:                           3D EF:  58 %                           LV EDV:       184 ml                           LV ESV:       77 ml                           LV SV:        107 ml   RIGHT VENTRICLE  RV Basal diam:  4.80 cm  RV Mid diam:    3.30 cm  RV S prime:     16.40 cm/s  TAPSE (M-mode): 2.3 cm   LEFT ATRIUM              Index        RIGHT ATRIUM           Index  LA diam:        4.30 cm  1.97 cm/m   RA Area:     16.60 cm  LA Vol (A2C):   95.0 ml  43.42 ml/m  RA Volume:   42.30 ml  19.33 ml/m  LA Vol (A4C):   94.4 ml  43.14 ml/m  LA Biplane Vol: 104.0 ml 47.53 ml/m   AORTIC VALVE  AV Area (Vmax):    1.09 cm  AV Area (Vmean):   1.02 cm  AV Area (VTI):     1.10 cm  AV Vmax:           449.00 cm/s  AV Vmean:          332.000 cm/s  AV VTI:            0.990 m  AV Peak Grad:      80.6 mmHg  AV Mean Grad:      49.0 mmHg  LVOT Vmax:         129.00 cm/s  LVOT Vmean:        88.700 cm/s  LVOT VTI:          0.286 m  LVOT/AV VTI ratio: 0.29  AI PHT:            380 msec    AORTA  Ao Root diam: 3.50 cm  Ao Asc diam:  4.00 cm   MITRAL VALVE                TRICUSPID VALVE  MV Area (PHT): 3.12 cm     TR Peak grad:   37.5 mmHg  MV Area VTI:   2.44  cm     TR Vmax:        306.00 cm/s  MV Peak grad:  11.7 mmHg  MV Mean grad:  6.0 mmHg     SHUNTS  MV Vmax:       1.71 m/s     Systemic VTI:  0.29 m  MV Vmean:      122.0 cm/s   Systemic Diam: 2.20 cm  MV Decel Time: 243 msec  MV E velocity: 160.00 cm/s  MV A velocity: 165.00 cm/s  MV E/A ratio:  0.97    CATH (11/2022) Conclusion   1.  Minimal obstructive coronary artery disease. 2.  Fick cardiac output 10.8 liters per minute and Fick cardiac index of 5.0 liters per minute per square  meter squared with the following hemodynamics:   Right atrial pressure mean 7 mmHg Right ventricular pressure 44/5 with an end-diastolic pressure of 15 mmHg Wedge pressure mean 18 mmHg Pulmonary pressure 41/18 with a mean of 28 mmHg Pulmonary vascular resistance of 1.2 Woods units    Recent Radiology Findings:   CTA (11/2022) FINDINGS: Aortic Valve: Severely thickened tri-leaflet aortic valve with heavy calcification and reduced excursion the planimeter valve area is 1.04 Sq cm consistent with moderate to severe aortic stenosis   Annular calcification: Severe with multiple calcifications and LVOT calcification.   Aortic Valve Calcium Score: 4960   Presence of basal septal hypertrophy: Yes, 18 mm   Systolic phase measurements are greater than diastolic phase imaging   Perimembranous septal diameter: 6 mm   Mitral Valve: Severe calcification with MAC and caseous posterior MAC and intra-valvular fibrosa calcification   Aortic Annulus Measurements- 25%   Major annulus diameter: 26 mm   Minor annulus diameter:23 mm   Annular perimeter: 78 mm   Annular area: 4.67 cm2.   Aortic Root Measurements- 75%   Sinotubular Junction: 31 mm   Ascending Thoracic Aorta: 37 mm   Aortic Arch: 26 mm   Descending Thoracic Aorta: 24 mm   Sinus of Valsalva Measurements:   Right coronary cusp width: 32 mm   Left coronary cusp width: 33 mm   Non coronary cusp width: 33 mm   Coronary Artery Height  above Annulus:   Left Main: 15 mm   Left SoV height: 23 mm   Right Coronary: 20 mm   Right SoV height: 23 mm   Optimum Fluoroscopic Angle for Delivery: LAO 2, CAU 2   Valves for structural team consideration: 26 mm Sapien Valve vs 29 mm Evolut Valve   Non TAVR Valve Findings:   Coronary Arteries: Normal coronary origin. Study not completed with nitroglycerin.   Coronary Calcium Score:   Left main: 0   Left anterior descending artery: 43   Left circumflex artery: 0   Right coronary artery: 33   Total: 76   Percentile: 69th for age, sex, and race matched control.   Systemic veins: Normal anatomy   Main Pulmonary artery: Dilated 30 mm   Pulmonary veins: Normal anatomy   Left atrial appendage: Patent   Interatrial septum: No communications   Left ventricle: Normal size   Left atrium: Dilated   Right ventricle: Normal size   Right atrium: Normal size   Pericardium: No calcifications   Extra Cardiac Findings as per separate reporting.   Notable artifacts: Poor contrast opacification and body attenuation artifact; study is interpretable for measurements   IMPRESSION: 1. Moderate to severe aortic stenosis. Findings pertinent to TAVR procedure are detailed above.     Recent Lab Findings: Recent Labs       Lab Results  Component Value Date    WBC 8.0 11/20/2022    HGB 10.9 (L) 11/25/2022    HCT 32.0 (L) 11/25/2022    PLT 170 11/20/2022    GLUCOSE 93 11/20/2022    CHOL 184 10/24/2022    TRIG 88.0 10/24/2022    HDL 48.30 10/24/2022    LDLCALC 119 (H) 10/24/2022    ALT 17 11/12/2021    AST 16 11/12/2021    NA 134 (L) 11/25/2022    K 2.8 (L) 11/25/2022    CL 103 11/20/2022    CREATININE 0.73 11/20/2022    BUN 16 11/20/2022    CO2 25 11/20/2022    TSH 0.99 11/12/2021  INR 0.97 09/22/2013    HGBA1C 5.8 10/24/2022            Assessment / Plan:     72 yo female with NYHA class I symptoms of severe AS with normal LV fucntion and no CAD. We  had a very good discussion of the natural history of AS and the need for AVR when becoming symptomatic. She understands the issues of the different procedures of TAVR vs SAVR and the reasons why we prefer pts to have either and she understands. I told her the only real negative aspect of her having SAVR was her obesity but overall she chooses to undergo TAVR. I agree with Dr Lynnette Caffey on the femoral access and that a 29 evolute would be appropriate and that she would be a bailout candidate if needed

## 2023-02-04 ENCOUNTER — Encounter (HOSPITAL_COMMUNITY)
Admission: RE | Disposition: A | Discharge status: Home / Self Care | Payer: Self-pay | Source: Ambulatory Visit | Attending: Internal Medicine

## 2023-02-04 ENCOUNTER — Inpatient Hospital Stay (HOSPITAL_COMMUNITY)
Admission: RE | Admit: 2023-02-04 | Discharge: 2023-02-05 | DRG: 266 | Disposition: A | Discharge status: Home / Self Care | Payer: Medicare Other | Source: Ambulatory Visit | Attending: Internal Medicine | Admitting: Internal Medicine

## 2023-02-04 ENCOUNTER — Inpatient Hospital Stay (HOSPITAL_COMMUNITY): Payer: Medicare Other

## 2023-02-04 ENCOUNTER — Inpatient Hospital Stay (HOSPITAL_COMMUNITY): Payer: Medicare Other | Admitting: Physician Assistant

## 2023-02-04 ENCOUNTER — Encounter (HOSPITAL_COMMUNITY): Payer: Self-pay | Admitting: Internal Medicine

## 2023-02-04 DIAGNOSIS — E785 Hyperlipidemia, unspecified: Secondary | ICD-10-CM | POA: Diagnosis present

## 2023-02-04 DIAGNOSIS — I358 Other nonrheumatic aortic valve disorders: Secondary | ICD-10-CM | POA: Diagnosis present

## 2023-02-04 DIAGNOSIS — L409 Psoriasis, unspecified: Secondary | ICD-10-CM | POA: Diagnosis present

## 2023-02-04 DIAGNOSIS — I10 Essential (primary) hypertension: Secondary | ICD-10-CM

## 2023-02-04 DIAGNOSIS — I472 Ventricular tachycardia, unspecified: Secondary | ICD-10-CM | POA: Diagnosis not present

## 2023-02-04 DIAGNOSIS — R7303 Prediabetes: Secondary | ICD-10-CM | POA: Diagnosis present

## 2023-02-04 DIAGNOSIS — R591 Generalized enlarged lymph nodes: Secondary | ICD-10-CM | POA: Diagnosis present

## 2023-02-04 DIAGNOSIS — I350 Nonrheumatic aortic (valve) stenosis: Secondary | ICD-10-CM | POA: Diagnosis present

## 2023-02-04 DIAGNOSIS — I110 Hypertensive heart disease with heart failure: Secondary | ICD-10-CM | POA: Diagnosis present

## 2023-02-04 DIAGNOSIS — Z8249 Family history of ischemic heart disease and other diseases of the circulatory system: Secondary | ICD-10-CM | POA: Diagnosis not present

## 2023-02-04 DIAGNOSIS — Z6841 Body Mass Index (BMI) 40.0 and over, adult: Secondary | ICD-10-CM

## 2023-02-04 DIAGNOSIS — Z006 Encounter for examination for normal comparison and control in clinical research program: Secondary | ICD-10-CM | POA: Diagnosis not present

## 2023-02-04 DIAGNOSIS — E7800 Pure hypercholesterolemia, unspecified: Secondary | ICD-10-CM | POA: Diagnosis present

## 2023-02-04 DIAGNOSIS — Z952 Presence of prosthetic heart valve: Secondary | ICD-10-CM

## 2023-02-04 DIAGNOSIS — R0902 Hypoxemia: Secondary | ICD-10-CM | POA: Diagnosis not present

## 2023-02-04 DIAGNOSIS — Z96653 Presence of artificial knee joint, bilateral: Secondary | ICD-10-CM | POA: Diagnosis present

## 2023-02-04 DIAGNOSIS — I2510 Atherosclerotic heart disease of native coronary artery without angina pectoris: Secondary | ICD-10-CM | POA: Diagnosis present

## 2023-02-04 DIAGNOSIS — I5033 Acute on chronic diastolic (congestive) heart failure: Secondary | ICD-10-CM | POA: Insufficient documentation

## 2023-02-04 DIAGNOSIS — Z888 Allergy status to other drugs, medicaments and biological substances status: Secondary | ICD-10-CM | POA: Diagnosis not present

## 2023-02-04 DIAGNOSIS — Z79899 Other long term (current) drug therapy: Secondary | ICD-10-CM

## 2023-02-04 DIAGNOSIS — D696 Thrombocytopenia, unspecified: Secondary | ICD-10-CM | POA: Diagnosis present

## 2023-02-04 HISTORY — PX: TRANSCATHETER AORTIC VALVE REPLACEMENT, TRANSFEMORAL: SHX6400

## 2023-02-04 HISTORY — DX: Nonrheumatic aortic (valve) stenosis: I35.0

## 2023-02-04 HISTORY — DX: Presence of prosthetic heart valve: Z95.2

## 2023-02-04 HISTORY — PX: INTRAOPERATIVE TRANSTHORACIC ECHOCARDIOGRAM: SHX6523

## 2023-02-04 LAB — POCT I-STAT, CHEM 8
BUN: 15 mg/dL (ref 8–23)
Calcium, Ion: 1.23 mmol/L (ref 1.15–1.40)
Chloride: 96 mmol/L — ABNORMAL LOW (ref 98–111)
Creatinine, Ser: 0.5 mg/dL (ref 0.44–1.00)
Glucose, Bld: 106 mg/dL — ABNORMAL HIGH (ref 70–99)
HCT: 36 % (ref 36.0–46.0)
Hemoglobin: 12.2 g/dL (ref 12.0–15.0)
Potassium: 3.7 mmol/L (ref 3.5–5.1)
Sodium: 134 mmol/L — ABNORMAL LOW (ref 135–145)
TCO2: 23 mmol/L (ref 22–32)

## 2023-02-04 LAB — ECHOCARDIOGRAM LIMITED
AR max vel: 1.59 cm2
AV Area VTI: 1.82 cm2
AV Area mean vel: 1.63 cm2
AV Mean grad: 11 mmHg
AV Peak grad: 19.7 mmHg
Ao pk vel: 2.22 m/s
MV VTI: 1.09 cm2
P 1/2 time: 330 ms
S' Lateral: 2.5 cm

## 2023-02-04 SURGERY — TRANSCATHETER AORTIC VALVE REPLACEMENT, TRANSFEMORAL
Anesthesia: Monitor Anesthesia Care

## 2023-02-04 MED ORDER — CEFAZOLIN SODIUM-DEXTROSE 2-4 GM/100ML-% IV SOLN
2.0000 g | Freq: Three times a day (TID) | INTRAVENOUS | Status: AC
  Administered 2023-02-04 – 2023-02-05 (×2): 2 g via INTRAVENOUS
  Filled 2023-02-04 (×2): qty 100

## 2023-02-04 MED ORDER — ACETAMINOPHEN 325 MG PO TABS: 650.0000 mg | ORAL_TABLET | Freq: Four times a day (QID) | ORAL | Status: DC | PRN

## 2023-02-04 MED ORDER — MORPHINE SULFATE (PF) 2 MG/ML IV SOLN
INTRAVENOUS | Status: AC
  Filled 2023-02-04: qty 1

## 2023-02-04 MED ORDER — SODIUM CHLORIDE 0.9 % IV SOLN: INTRAVENOUS | Status: AC

## 2023-02-04 MED ORDER — VERAPAMIL HCL 2.5 MG/ML IV SOLN
INTRAVENOUS | Status: AC
  Filled 2023-02-04: qty 2

## 2023-02-04 MED ORDER — MORPHINE SULFATE (PF) 2 MG/ML IV SOLN
1.0000 mg | INTRAVENOUS | Status: DC | PRN
  Administered 2023-02-04: 2 mg via INTRAVENOUS

## 2023-02-04 MED ORDER — ONDANSETRON HCL 4 MG/2ML IJ SOLN: 4.0000 mg | Freq: Four times a day (QID) | INTRAMUSCULAR | Status: DC | PRN

## 2023-02-04 MED ORDER — SODIUM CHLORIDE 0.9 % IV SOLN: 250.0000 mL | INTRAVENOUS | Status: DC | PRN

## 2023-02-04 MED ORDER — POTASSIUM CHLORIDE CRYS ER 20 MEQ PO TBCR
20.0000 meq | EXTENDED_RELEASE_TABLET | Freq: Once | ORAL | Status: AC
  Administered 2023-02-05: 20 meq via ORAL
  Filled 2023-02-04: qty 1

## 2023-02-04 MED ORDER — OXYCODONE HCL 5 MG PO TABS: 5.0000 mg | ORAL_TABLET | ORAL | Status: DC | PRN

## 2023-02-04 MED ORDER — LACTATED RINGERS IV SOLN: INTRAVENOUS | Status: DC

## 2023-02-04 MED ORDER — CHLORHEXIDINE GLUCONATE 4 % EX SOLN
60.0000 mL | Freq: Once | CUTANEOUS | Status: DC
  Administered 2023-02-04: 4 via TOPICAL
  Filled 2023-02-04: qty 60

## 2023-02-04 MED ORDER — CHLORHEXIDINE GLUCONATE 4 % EX SOLN: 30.0000 mL | CUTANEOUS | Status: DC

## 2023-02-04 MED ORDER — ACETAMINOPHEN 650 MG RE SUPP: 650.0000 mg | Freq: Four times a day (QID) | RECTAL | Status: DC | PRN

## 2023-02-04 MED ORDER — MORPHINE SULFATE (PF) 2 MG/ML IV SOLN
INTRAVENOUS | Status: AC
  Administered 2023-02-04: 2 mg via INTRAVENOUS
  Filled 2023-02-04: qty 1

## 2023-02-04 MED ORDER — CHLORHEXIDINE GLUCONATE 0.12 % MT SOLN
15.0000 mL | Freq: Once | OROMUCOSAL | Status: AC
  Administered 2023-02-05: 15 mL via OROMUCOSAL
  Filled 2023-02-04: qty 15

## 2023-02-04 MED ORDER — SODIUM CHLORIDE 0.9% FLUSH
3.0000 mL | Freq: Two times a day (BID) | INTRAVENOUS | Status: DC
  Administered 2023-02-05 (×2): 3 mL via INTRAVENOUS

## 2023-02-04 MED ORDER — TRAMADOL HCL 50 MG PO TABS: 50.0000 mg | ORAL_TABLET | ORAL | Status: DC | PRN

## 2023-02-04 MED ORDER — SODIUM CHLORIDE 0.9% FLUSH: 3.0000 mL | INTRAVENOUS | Status: DC | PRN

## 2023-02-04 MED ORDER — FENTANYL CITRATE (PF) 100 MCG/2ML IJ SOLN
INTRAMUSCULAR | Status: AC
  Filled 2023-02-04: qty 2

## 2023-02-04 MED ORDER — SODIUM CHLORIDE 0.9 % IV SOLN: INTRAVENOUS | Status: DC

## 2023-02-04 MED ORDER — FUROSEMIDE 10 MG/ML IJ SOLN
40.0000 mg | Freq: Once | INTRAMUSCULAR | Status: AC
  Administered 2023-02-05: 40 mg via INTRAVENOUS
  Filled 2023-02-04: qty 4

## 2023-02-04 MED ORDER — NITROGLYCERIN IN D5W 200-5 MCG/ML-% IV SOLN: 0.0000 ug/min | INTRAVENOUS | Status: DC

## 2023-02-04 MED ORDER — IOHEXOL 350 MG/ML SOLN
INTRAVENOUS | Status: DC | PRN
  Administered 2023-02-04: 95 mL via INTRA_ARTERIAL

## 2023-02-04 MED ORDER — LIDOCAINE HCL (PF) 1 % IJ SOLN
INTRAMUSCULAR | Status: AC
  Filled 2023-02-04: qty 30

## 2023-02-04 MED ORDER — LIDOCAINE HCL (PF) 1 % IJ SOLN
INTRAMUSCULAR | Status: DC | PRN
  Administered 2023-02-04: 2 mL
  Administered 2023-02-04 (×2): 5 mL
  Administered 2023-02-04: 2 mL

## 2023-02-04 MED ORDER — AMLODIPINE BESYLATE 5 MG PO TABS
5.0000 mg | ORAL_TABLET | Freq: Every day | ORAL | Status: DC
  Administered 2023-02-04: 5 mg via ORAL
  Filled 2023-02-04: qty 1

## 2023-02-04 MED ORDER — HEPARIN SODIUM (PORCINE) 1000 UNIT/ML IJ SOLN
INTRAMUSCULAR | Status: DC | PRN
Start: 2023-02-04 — End: 2023-02-04
  Administered 2023-02-04: 18000 [IU] via INTRAVENOUS

## 2023-02-04 MED ORDER — HEPARIN (PORCINE) IN NACL 1000-0.9 UT/500ML-% IV SOLN
INTRAVENOUS | Status: DC | PRN
  Administered 2023-02-04 (×2): 500 mL

## 2023-02-04 MED ORDER — PROTAMINE SULFATE 10 MG/ML IV SOLN
INTRAVENOUS | Status: DC | PRN
  Administered 2023-02-04: 180 mg via INTRAVENOUS

## 2023-02-04 MED ORDER — CHLORHEXIDINE GLUCONATE 0.12 % MT SOLN
OROMUCOSAL | Status: AC
  Administered 2023-02-04: 15 mL via OROMUCOSAL
  Filled 2023-02-04: qty 15

## 2023-02-04 SURGICAL SUPPLY — 30 items
BAG SNAP BAND KOVER 36X36 (MISCELLANEOUS) ×2
BALLN TRUE 22X4.5 (BALLOONS) ×1
CABLE ADAPT PACING TEMP 12FT (ADAPTER) ×1
CATH DIAG 6FR PIGTAIL ANGLED (CATHETERS) ×1
CATH INFINITI 5FR ANG PIGTAIL (CATHETERS) ×1
CATH INFINITI 6F AL1 (CATHETERS) ×1
CATH-GARD ARROW CATH SHIELD (MISCELLANEOUS) ×1
CLOSURE PERCLOSE PROSTYLE (VASCULAR PRODUCTS) ×3
GLIDESHEATH SLEND SS 6F .021 (SHEATH) ×1
KIT HEART LEFT (KITS) ×1
MAT PREVALON FULL STRYKER (MISCELLANEOUS) ×1
PACK CARDIAC CATHETERIZATION (CUSTOM PROCEDURE TRAY) ×1
SET ATX-X65L (MISCELLANEOUS) ×1
SHEATH DRYSEAL FLEX 18FR 33CM (SHEATH) ×1
SHEATH PINNACLE 6F 10CM (SHEATH) ×3
SHEATH PINNACLE 8F 10CM (SHEATH) ×1
SHEATH PROBE COVER 6X72 (BAG) ×1
STOPCOCK MORSE 400PSI 3WAY (MISCELLANEOUS) ×2
SYS EVOLUT FX DELIVERY 23-29 (CATHETERS) ×1
SYS EVOLUT FX LOADING 23-29 (CATHETERS) ×1
TRANSDUCER W/STOPCOCK (MISCELLANEOUS) ×2
TUBING ART PRESS 72 MALE/FEM (TUBING) ×2
VALVE EVOLUT FX 29 (Valve) ×1 IMPLANT
WIRE AMPLATZ SS-J .035X180CM (WIRE) ×1
WIRE EMERALD 3MM-J .035X150CM (WIRE) ×1
WIRE EMERALD 3MM-J .035X260CM (WIRE) ×1
WIRE EMERALD ST .035X260CM (WIRE) ×2
WIRE MICRO SET SILHO 5FR 7 (SHEATH) ×2
WIRE PACING TEMP ST TIP 5 (CATHETERS) ×2
WIRE SAFARI SM CURVE 275 (WIRE) ×1

## 2023-02-04 NOTE — Interval H&P Note (Signed)
History and Physical Interval Note:  02/04/2023 11:56 AM  Charlene Tapia  has presented today for surgery, with the diagnosis of Severe Aortic Stenosis.  The various methods of treatment have been discussed with the patient and family. After consideration of risks, benefits and other options for treatment, the patient has consented to  Procedure(s): Transcatheter Aortic Valve Replacement, Transfemoral (N/A) INTRAOPERATIVE TRANSTHORACIC ECHOCARDIOGRAM (N/A) as a surgical intervention.  The patient's history has been reviewed, patient examined, no change in status, stable for surgery.  I have reviewed the patient's chart and labs.  Questions were answered to the patient's satisfaction.     [name]

## 2023-02-04 NOTE — Progress Notes (Signed)
  [facility]   MULTIDISCIPLINARY HEART VALVE TEAM  Patient doing well s/p TAVR. She is hemodynamically stable. Right groin with hematoma and Lauren RN was holding upon my arrival. Will place a fem stop at 30 mm hg. ECG with sinus and no high grade block. Was satting okay but started to get hypoxic when holding pressure likely 2/2 to holding breath. Placed on a NRB mask at 15 L. Had an elevated LVEDP ~27 mm hg at the time of TAVR. Will give Lasix 40mg  / Kdur x 1.   Plan to transfer to from cath lab holding to 4E when bed available. Early ambulation after bedrest completed and hopeful discharge over the next 24-48 hours.   Cline Crock PA-C  [facility]  Pager [phone]

## 2023-02-04 NOTE — Progress Notes (Signed)
  Echocardiogram 2D Echocardiogram has been performed.  Charlene Tapia 02/04/2023, 3:38 PM

## 2023-02-04 NOTE — Progress Notes (Signed)
Pt arrived to Holding from Cath Lab with Level 2 hematoma (5cm) at right groin; site handoff with staff reveals somewhat hardened site with soft edges. Per staff, manual pressure was held while in cath lab and site was only able to soften to current status. MD Lynnette Caffey was present and aware of hematoma status upon departure from cath lab. Pt hemodynamically stable.   Palpation of hematoma for 1630 assessment found new growth/hardening. Manual pressure applied at this time. PA Thompson at bedside. MD Lynnette Caffey and Cath Lab charge RN Suzette Battiest notified. New orders for FemStop; applied at 1700. After 30 min of FemStop compression per orders, hematoma soft to palpation. Site monitored for 30 minutes before initiating transfer to 4E. Pt mentation and vitals remain stable throughout event and post FemStop removal. See flowsheets for additional information.

## 2023-02-04 NOTE — Op Note (Signed)
[facility]   MULTIDISCIPLINARY HEART VALVE TEAM   TAVR OPERATIVE NOTE   Date of Procedure:  02/04/2023   Preoperative Diagnosis: Severe Aortic Stenosis   Postoperative Diagnosis: Same   Procedure:   Transcatheter Aortic Valve Replacement - Percutaneous right Transfemoral Approach             Medtronic Evolut FX  (size 29 mm, serial # [id number])              Co-Surgeons:            [name], MD and Alverda Skeans, MD     Anesthesiologist:                  Roslynn Amble, MD   Echocardiographer:              Charlton Haws, MD   Pre-operative Echo Findings: Severe aortic stenosis  Normal left ventricular systolic function   Post-operative Echo Findings: no paravalvular leak Normal left ventricular systolic function  Left Heart Catheterization  LVEDP  BRIEF CLINICAL NOTE AND INDICATIONS FOR SURGERY  The patient is a 72 year old female with a history of hypertension, BMI of 48, and severe aortic insufficiency with moderate aortic insufficiency who was referred for elective transcatheter aortic valve replacement with 29 mm Evolut FX valve.  During the course of the patient's preoperative work up they have been evaluated comprehensively by a multidisciplinary team of specialists coordinated through the Multidisciplinary Heart Valve Clinic in the [facility].  They have been demonstrated to suffer from symptomatic severe aortic stenosis as noted above. The patient has been counseled extensively as to the relative risks and benefits of all options for the treatment of severe aortic stenosis including long term medical therapy, conventional surgery for aortic valve replacement, and transcatheter aortic valve replacement.  The patient has been independently evaluated in formal cardiac surgical consultation by Dr Leafy Ro, who deemed the patient appropriate for TAVR. Based upon review of all of the patient's preoperative diagnostic tests they  are felt to be candidate for transcatheter aortic valve replacement using the transfemoral approach as an alternative to conventional surgery.    Following the decision to proceed with transcatheter aortic valve replacement, a discussion has been held regarding what types of management strategies would be attempted intraoperatively in the event of life-threatening complications, including whether or not the patient would be considered a candidate for the use of cardiopulmonary bypass and/or conversion to open sternotomy for attempted surgical intervention.  The patient has been advised of a variety of complications that might develop peculiar to this approach including but not limited to risks of death, stroke, paravalvular leak, aortic dissection or other major vascular complications, aortic annulus rupture, device embolization, cardiac rupture or perforation, acute myocardial infarction, arrhythmia, heart block or bradycardia requiring permanent pacemaker placement, congestive heart failure, respiratory failure, renal failure, pneumonia, infection, other late complications related to structural valve deterioration or migration, or other complications that might ultimately cause a temporary or permanent loss of functional independence or other long term morbidity.  The patient provides full informed consent for the procedure as described and all questions were answered preoperatively.  DETAILS OF THE OPERATIVE PROCEDURE  PREPARATION:   The patient is brought to the operating room on the above mentioned date and central monitoring was established by the anesthesia team including placement of a radial arterial line. The patient is placed in the supine position on the operating table.  Intravenous antibiotics are  administered. The patient is monitored closely throughout the procedure under conscious sedation.    Baseline transthoracic echocardiogram is performed. The patient's chest, abdomen, both groins, and  both lower extremities are prepared and draped in a sterile manner. A time out procedure is performed.   PERIPHERAL ACCESS:   Using ultrasound guidance, femoral arterial and venous access is obtained with placement of 6 Fr sheaths on the left and right sides respectively.  [place] images are captured and digitally stored in the patient's record. A pigtail diagnostic catheter was passed through the femoral arterial sheath under fluoroscopic guidance into the aortic root (non-coronary cusp).  Ultrasound was used to gain access to the right internal jugular vein.  A temporary transvenous pacemaker catheter was passed through the jugular venous sheath under fluoroscopic guidance into the right ventricle.  The pacemaker was tested to ensure stable lead placement and pacemaker capture.   TRANSFEMORAL ACCESS:  A micropuncture technique is used to access the right femoral artery under fluoroscopic and ultrasound guidance.  [place] images are captured and digitally stored in the patient's chart. 2 Perclose devices are deployed at 10' and 2' positions to 'PreClose' the femoral artery. An 8 French sheath is placed and then an Amplatz Superstiff wire is advanced through the sheath. This is changed out for an 18 Fr Dry-Seal sheath after progressively dilating over the Superstiff wire.  An AL-1 catheter was used to direct a straight-tip exchange length wire across the native aortic valve into the left ventricle. This was exchanged out for a pigtail catheter and position was confirmed in the LV apex.  Simultaneous LV and Ao pressures were recorded.  The pigtail catheter was exchanged for a Safari wire in the LV apex.    BALLOON AORTIC VALVULOPLASTY:  Balloon aortic valvuloplasty with a 22mm True Balloon was performed  TRANSCATHETER HEART VALVE DEPLOYMENT:  A Medtronic Evolut FX transcatheter heart valve (size 29 mm) was prepared and crimped per manufacturer's guidelines, and the proper orientation of the valve is confirmed on  the Medtronic delivery system. The valve was advanced through the introducer sheath and across the aortic arch over the Confida wire until it is positioned at the base of the pigtail catheter in the aortic valve annulus. Using the fine tuning wheel, valve deployment begins until annular contact is made. Controlled ventricular pacing is performed. Once proper position is confirmed via aortic root angiograms in the cusp overlap and LAO projections, the valve is deployed to 80% where it is fully functional. The patient's hemodynamic recovery following valve deployment is good.  Final position is confirmed and the valve is slowly deployed over 30 seconds until both paddles are released. The delivery catheter and Safari wire are removed and the valve is assessed with echocardiography. Echo demostrated acceptable post-procedural gradients, stable mitral valve function, and  aortic insufficiency.    PROCEDURE COMPLETION:  The sheath was removed and femoral artery closure is performed using the 2 previously deployed Perclose devices.  Protamine is administered once femoral arterial repair was complete. The site is clear with no evidence of bleeding or hematoma after the sutures are tightened. The temporary pacemaker and pigtail catheters are removed. Perclose closure is used for contralateral femoral arterial hemostasis for the 6 Fr sheath.  The patient tolerated the procedure well and is transported to the recovery area in stable condition. There were no immediate intraoperative complications. All sponge instrument and needle counts are verified correct at completion of the operation.   The patient received a total of 90  mL of intravenous contrast during the procedure.  Alverda Skeans, MD

## 2023-02-04 NOTE — Discharge Summary (Incomplete)
[facility]   MULTIDISCIPLINARY HEART VALVE TEAM  Discharge Summary    Patient ID: Charlene Tapia MRN: [id number]; DOB: March 21, 1951  Admit date: 02/04/2023 Discharge date: 02/05/2023  Primary Care Provider: Elenore Paddy, NP  Primary Cardiologist: Orbie Pyo, MD   Discharge Diagnoses    Principal Problem:   S/P TAVR (transcatheter aortic valve replacement) Active Problems:   Obesity, morbid, BMI 50 or higher (HCC)   Psoriasis   Hyperlipidemia   Prediabetes   Thrombocytopenia (HCC)   HTN (hypertension)   Severe aortic stenosis   Acute on chronic diastolic heart failure (HCC)   Allergies Allergies  Allergen Reactions   Phisohex [Hexachlorophene] Rash    Gets blisters on skin     Diagnostic Studies/Procedures    TAVR OPERATIVE NOTE     Date of Procedure:                02/04/2023    Preoperative Diagnosis:      Severe Aortic Stenosis    Postoperative Diagnosis:    Same    Procedure:        Transcatheter Aortic Valve Replacement - Percutaneous right Transfemoral Approach             Medtronic Evolut FX  (size 29 mm, serial # [id number])              Co-Surgeons:            [name], MD and Alverda Skeans, MD     Anesthesiologist:                  Roslynn Amble, MD   Echocardiographer:              Charlton Haws, MD   Pre-operative Echo Findings: Severe aortic stenosis  Normal left ventricular systolic function   Post-operative Echo Findings: no paravalvular leak Normal left ventricular systolic function   Left Heart Catheterization             LVEDP  _____________    Echo 02/05/23: completed but pending formal read at the time of discharge   History of Present Illness     Charlene Tapia is a 72 y.o. female with a history of HTN, morbid obesity (BMI 48), and severe AS with moderate AI who presented to [facility] on 02/04/23 for planned TAVR.   She was recenlty noted to have a heart murmur by PCP which prompted an echo. Echo  11/20/22 showed EF 60% and severe mean grad 49 mmHg, AVA 1.02 cm2 (felt to be over-estimated based on an inaccurate LVOT diameter), moderate AI, moderate MS/mild MR. [facility] 11/25/22 showed minimal CAD  She was evaluated by the multidisciplinary valve team and felt to have severe, symptomatic aortic stenosis and to be a suitable candidate for TAVR, which was set up for 02/04/23.   Hospital Course     Consultants: none   Severe AS: s/p successful TAVR with a 29 mm Evolut FX THV via the TF approach on 02/04/23. Post operative echo completed but pending formal read. Groin sites are stable. ECG with sinus and no high grade heart block. Started on a baby Asprin 81mg  daily. Walked with cardiac rehab with no issues. Plan for discharge home today with close follow up in the outpatient setting.   Acute on chronic HFpEF: as evidenced by an elevated LVEDP of 27 mmHg at the time of TAVR. Treated with IV lasix 40mg /Kdur x1. Will start Lasix 20 mg daily at  discharge. Plan for follow up BMET and mag at follow up.   NSVT: 9 beat run noted on tele. K and mag WNL. Started on Toprol XL 12.5mg  at bedtime.   HTN: BP well controlled. Resume home Norvasc 5mg  daily. As above, started on Toprol XL 12.5mg  QHS and Lasix 20mg  daily.   Morbid obesity:  Body mass index is 48.67 kg/m. Work on diet and exercise.   Lymphadenopathy: pre TAVR CT showed an "enlarged left-greater-than-right inguinal lymph nodes, could be reactive. Recommend follow-up with inguinal ultrasound in 6-12 weeks to ensure resolution." This will be discussed in the outpatient setting.   _____________  Discharge Vitals Blood pressure (!) 132/52, pulse 72, temperature 98.1 F (36.7 C), temperature source Oral, resp. rate 19, height 5\' 2"  (1.575 m), weight 120.7 kg, SpO2 98%.  Filed Weights   02/03/23 1300 02/04/23 1122 02/05/23 0508  Weight: 122 kg 122 kg 120.7 kg    GEN: Well nourished, well developed, in no acute distress, obese HEENT:  normal Neck: no JVD or masses Cardiac: RRR; no murmurs, rubs, or gallops,no edema  Respiratory:  clear to auscultation bilaterally, normal work of breathing GI: soft, nontender, nondistended, + BS MS: no deformity or atrophy Skin: warm and dry, no rash.  Groin sites stable. Diffuse right groin ecchymosis  without hematoma Neuro:  Alert and Oriented x 3, Strength and sensation are intact Psych: euthymic mood, full affect   Labs & Radiologic Studies    CBC Recent Labs    02/04/23 1544 02/05/23 0536  WBC  --  7.3  HGB 12.2 11.2*  HCT 36.0 34.4*  MCV  --  88.7  PLT  --  128*   Basic Metabolic Panel Recent Labs    78/29/56 1544 02/05/23 0536  NA 134* 136  K 3.7 3.7  CL 96* 101  CO2  --  25  GLUCOSE 106* 125*  BUN 15 14  CREATININE 0.50 0.70  CALCIUM  --  8.6*  MG  --  1.7   Liver Function Tests No results for input(s): "AST", "ALT", "ALKPHOS", "BILITOT", "PROT", "ALBUMIN" in the last 72 hours. No results for input(s): "LIPASE", "AMYLASE" in the last 72 hours. Cardiac Enzymes No results for input(s): "CKTOTAL", "CKMB", "CKMBINDEX", "TROPONINI" in the last 72 hours. BNP Invalid input(s): "POCBNP" D-Dimer No results for input(s): "DDIMER" in the last 72 hours. Hemoglobin A1C No results for input(s): "HGBA1C" in the last 72 hours. Fasting Lipid Panel No results for input(s): "CHOL", "HDL", "LDLCALC", "TRIG", "CHOLHDL", "LDLDIRECT" in the last 72 hours. Thyroid Function Tests No results for input(s): "TSH", "T4TOTAL", "T3FREE", "THYROIDAB" in the last 72 hours.  Invalid input(s): "FREET3" _____________  Structural Heart Procedure  Result Date: 02/04/2023  Successful TAVR with 29mm Evolut FX valve.   ECHOCARDIOGRAM LIMITED  Result Date: 02/04/2023    ECHOCARDIOGRAM LIMITED REPORT   Patient Name:   Charlene Tapia Date of Exam: 02/04/2023 Medical Rec #:  [id number]         Height:       62.0 in Accession #:    [id number]        Weight:       269.0 lb Date of Birth:   1950-09-28         BSA:          2.168 m Patient Age:    72 years          BP:           164/84 mmHg Patient Gender: F  HR:           75 bpm. Exam Location:  Inpatient Procedure: Echo Assisted Procedure Indications:     TAVR  History:         Patient has prior history of Echocardiogram examinations, most                  recent 11/20/2022. Aortic Valve Disease; Risk                  Factors:Hypertension and Dyslipidemia.                  Aortic Valve: 29 mm Medtronic stented (TAVR) valve is present                  in the aortic position. Procedure Date: 02/04/23.  Sonographer:     Delcie Roch RDCS Referring Phys:  [id number] Orbie Pyo Diagnosing Phys: Charlton Haws MD IMPRESSIONS  1. Left ventricular ejection fraction, by estimation, is 60 to 65%. The left ventricle has normal function. There is mild left ventricular hypertrophy.  2. Left atrial size was moderately dilated.  3. Right atrial size was mildly dilated.  4. The mitral valve is degenerative. Trivial mitral valve regurgitation. Severe mitral annular calcification.  5. Pre TAVR: tri leaflet AV with severe calcification moderate AR and severe AS with mean gradient 50 peak 92 mmHg AVA 0.6 cm2         Post TAVR: well placed 29 mm supra annular Medtronic Evolut valve mean gradient 11 peak 20 mmHg AVA 2.3 cm2 no significant PVL after wire removed . The aortic valve has been repaired/replaced. There is a 29 mm Medtronic stented (TAVR) valve present in the aortic position. Procedure Date: 02/04/23. FINDINGS  Left Ventricle: Left ventricular ejection fraction, by estimation, is 60 to 65%. The left ventricle has normal function. The left ventricular internal cavity size was normal in size. There is mild left ventricular hypertrophy. Left Atrium: Left atrial size was moderately dilated. Right Atrium: Right atrial size was mildly dilated. Pericardium: There is no evidence of pericardial effusion. Mitral Valve: The mitral valve is degenerative in  appearance. There is severe thickening of the mitral valve leaflet(s). There is severe calcification of the mitral valve leaflet(s). Severe mitral annular calcification. Trivial mitral valve regurgitation. MV peak gradient, 13.7 mmHg. The mean mitral valve gradient is 7.0 mmHg. Tricuspid Valve: Tricuspid valve regurgitation is mild. Aortic Valve: Pre TAVR: tri leaflet AV with severe calcification moderate AR and severe AS with mean gradient 50 peak 92 mmHg AVA 0.6 cm2 Post TAVR: well placed 29 mm supra annular Medtronic Evolut valve mean gradient 11 peak 20 mmHg AVA 2.3 cm2 no significant PVL after wire removed. The aortic valve has been repaired/replaced. Aortic regurgitation PHT measures 330 msec. Aortic valve mean gradient measures 11.0 mmHg. Aortic valve peak gradient measures 19.7 mmHg. Aortic valve area, by VTI measures 1.82 cm. There is a 29 mm Medtronic stented (TAVR) valve present in the aortic position. Procedure Date: 02/04/23. Additional Comments: Spectral Doppler performed. Color Doppler performed.  LEFT VENTRICLE PLAX 2D LVIDd:         4.80 cm LVIDs:         2.50 cm LV PW:         1.30 cm LV IVS:        1.20 cm LVOT diam:     1.75 cm LV SV:         78 LV SV Index:   36  LVOT Area:     2.41 cm  LEFT ATRIUM         Index LA diam:    4.00 cm 1.84 cm/m  AORTIC VALVE AV Area (Vmax):    1.59 cm AV Area (Vmean):   1.63 cm AV Area (VTI):     1.82 cm AV Vmax:           222.00 cm/s AV Vmean:          150.000 cm/s AV VTI:            0.430 m AV Peak Grad:      19.7 mmHg AV Mean Grad:      11.0 mmHg LVOT Vmax:         147.00 cm/s LVOT Vmean:        101.750 cm/s LVOT VTI:          0.326 m LVOT/AV VTI ratio: 0.76 AI PHT:            330 msec  AORTA Ao Root diam: 2.80 cm Ao Asc diam:  3.50 cm MITRAL VALVE              TRICUSPID VALVE MV Area VTI:  1.09 cm    TR Peak grad:   34.6 mmHg MV Peak grad: 13.7 mmHg   TR Vmax:        294.00 cm/s MV Mean grad: 7.0 mmHg MV Vmax:      1.85 m/s    SHUNTS MV Vmean:     125.0  cm/s  Systemic VTI:  0.33 m                           Systemic Diam: 1.75 cm Charlton Haws MD Electronically signed by Charlton Haws MD Signature Date/Time: 02/04/2023/3:42:01 PM    Final    DG Chest 2 View  Result Date: 02/04/2023 CLINICAL DATA:  Severe aortic stenosis.  Preop exam. EXAM: CHEST - 2 VIEW COMPARISON:  Radiograph 06/09/2013, CT 12/02/2022 FINDINGS: The heart is normal in size.The cardiomediastinal contours are normal. Aortic atherosclerosis. Pulmonary vasculature is normal. No consolidation, pleural effusion, or pneumothorax. No acute osseous abnormalities are seen. Thoracic scoliosis. IMPRESSION: No acute chest findings. Electronically Signed   By: Narda Rutherford M.D.   On: 02/04/2023 09:12   Disposition   Pt is being discharged home today in good condition.  Follow-up Plans & Appointments     Follow-up Information     Filbert Schilder, NP. Go on 02/10/2023.   Specialty: Cardiology Why: @ 8:15am, please arrive 10 min early. Contact information: [address] [place] [address] [phone]                Discharge Instructions     Amb Referral to Cardiac Rehabilitation   Complete by: As directed    Diagnosis: Valve Replacement   Valve: Aortic   After initial evaluation and assessments completed: Virtual Based Care may be provided alone or in conjunction with Phase 2 Cardiac Rehab based on patient barriers.: Yes   Intensive Cardiac Rehabilitation (ICR) [facility] location only OR Traditional Cardiac Rehabilitation (TCR) *If criteria for ICR are not met will enroll in TCR [facility] only): Yes       Discharge Medications   Allergies as of 02/05/2023       Reactions   Phisohex [hexachlorophene] Rash   Gets blisters on skin         Medication List  TAKE these medications    amLODipine 5 MG tablet Commonly known as: NORVASC Take 1 tablet (5 mg total) by mouth daily. What changed: when to take this   aspirin EC 81 MG tablet Take 1 tablet (81  mg total) by mouth daily. Swallow whole.   Clobetasol Propionate E 0.05 % emollient cream Generic drug: Clobetasol Prop Emollient Base Apply to affected area 2 (two) times daily. What changed:  when to take this reasons to take this   fluticasone 50 MCG/ACT nasal spray Commonly known as: FLONASE Place 2 sprays into both nostrils daily as needed for allergies or rhinitis.   furosemide 20 MG tablet Commonly known as: Lasix Take 1 tablet (20 mg total) by mouth daily.   ibuprofen 200 MG tablet Commonly known as: ADVIL Take 400 mg by mouth every 8 (eight) hours as needed for mild pain or moderate pain.   metoprolol succinate 25 MG 24 hr tablet Commonly known as: Toprol XL Take 0.5 tablets (12.5 mg total) by mouth at bedtime.   MULTIVITAMIN GUMMIES WOMENS PO Take 2 each by mouth daily.         Outstanding Labs/Studies   BMET, mag  Duration of Discharge Encounter   Greater than 30 minutes including physician time.  Signed, Cline Crock, PA-C 02/05/2023, 10:21 AM [phone]   ATTENDING ATTESTATION:  After conducting a review of all available clinical information with the care team, interviewing the patient, and performing a physical exam, I agree with the findings and plan described in this note.   GEN: No acute distress.   HEENT:  MMM, no JVD, no scleral icterus Cardiac: RRR, no murmurs, rubs, or gallops.  Respiratory: Clear to auscultation bilaterally. GI: Soft, nontender, non-distended  MS: No edema; No deformity. Neuro:  Nonfocal  Vasc:  +2 radial pulses; access sites intact with moderate bruising R groin  Patient doing well after TAVR with stable access sites, no evidence of stroke, and no conduction abnormalities.  Follow-up echocardiogram today.  Given elevated LVEDP will initiate diuretics and check labs at follow-up.  Start Toprol-XL 12.5 mg at bedtime due to NSVT.  Alverda Skeans, MD Pager [phone]

## 2023-02-04 NOTE — Transfer of Care (Signed)
Immediate Anesthesia Transfer of Care Note  Patient: Charlene Tapia  Procedure(s) Performed: Transcatheter Aortic Valve Replacement, Transfemoral INTRAOPERATIVE TRANSTHORACIC ECHOCARDIOGRAM  Patient Location: Cath Lab  Anesthesia Type:MAC  Level of Consciousness: awake, alert , and oriented  Airway & Oxygen Therapy: Patient Spontanous Breathing  Post-op Assessment: Report given to RN, Post -op Vital signs reviewed and stable, Patient moving all extremities X 4, and Patient able to stick tongue midline  Post vital signs: Reviewed and stable  Last Vitals:  Vitals Value Taken Time  BP 119/62 02/04/23 1610  Temp 98.6   Pulse 73 02/04/23 1611  Resp 19 02/04/23 1611  SpO2 90 % 02/04/23 1611  Vitals shown include unfiled device data.  Last Pain:  Vitals:   02/04/23 1430  TempSrc:   PainSc: 0-No pain      Patients Stated Pain Goal: 2 (02/04/23 1155)  Complications: There were no known notable events for this encounter.

## 2023-02-04 NOTE — Progress Notes (Signed)
Pt arrived from ...cath.., A/ox 4...pt denies any pain, MD aware,CCMD called. CHG bath given,no further needs at this time

## 2023-02-04 NOTE — Anesthesia Preprocedure Evaluation (Signed)
Anesthesia Evaluation  Patient identified by MRN, date of birth, ID band Patient awake    Reviewed: Allergy & Precautions, NPO status , Patient's Chart, lab work & pertinent test results, reviewed documented beta blocker date and time   History of Anesthesia Complications (+) history of anesthetic complications  Airway Mallampati: III  TM Distance: >3 FB Neck ROM: Limited    Dental no notable dental hx.    Pulmonary neg pneumonia , neg COPD   breath sounds clear to auscultation       Cardiovascular Exercise Tolerance: Poor hypertension, (-) angina (-) CAD, (-) Past MI, (-) CABG and (-) Peripheral Vascular Disease + Valvular Problems/Murmurs (severe AS, mod MS) AS  Rhythm:Regular Rate:Normal + Systolic murmurs    Neuro/Psych neg Seizures    GI/Hepatic ,neg GERD  ,,(+) neg Cirrhosis        Endo/Other  neg diabetes  Morbid obesity  Renal/GU Renal disease     Musculoskeletal  (+) Arthritis ,    Abdominal   Peds  Hematology   Anesthesia Other Findings   Reproductive/Obstetrics                              Anesthesia Physical Anesthesia Plan  ASA: 4  Anesthesia Plan: MAC   Post-op Pain Management:    Induction: Intravenous  PONV Risk Score and Plan: 2 and Ondansetron  Airway Management Planned:   Additional Equipment:   Intra-op Plan:   Post-operative Plan:   Informed Consent: I have reviewed the patients History and Physical, chart, labs and discussed the procedure including the risks, benefits and alternatives for the proposed anesthesia with the patient or authorized representative who has indicated his/her understanding and acceptance.     Dental advisory given  Plan Discussed with: CRNA  Anesthesia Plan Comments:          Anesthesia Quick Evaluation

## 2023-02-04 NOTE — Discharge Instructions (Signed)
ACTIVITY AND EXERCISE  Daily activity and exercise are an important part of your recovery. People recover at different rates depending on their general health and type of valve procedure.  Most people recovering from TAVR feel better relatively quickly   No lifting, pushing, pulling more than 10 pounds (examples to avoid: groceries, vacuuming, gardening, golfing):             - For one week with a procedure through the groin.             - For six weeks for procedures through the chest wall or neck. NOTE: You will typically see one of our providers 7-14 days after your procedure to discuss WHEN TO RESUME the above activities.      DRIVING  Do not drive until you are seen for follow up and cleared by a provider. Generally, we ask patient to not drive for 1 week after their procedure.  If you have been told by your doctor in the past that you may not drive, you must talk with him/her before you begin driving again.   DRESSING  Groin site: you may leave the clear dressing over the site for up to one week or until it falls off.   HYGIENE  If you had a femoral (leg) procedure, you may take a shower when you return home. After the shower, pat the site dry. Do NOT use powder, oils or lotions in your groin area until the site has completely healed.  If you had a chest procedure, you may shower when you return home unless specifically instructed not to by your discharging practitioner.             - DO NOT scrub incision; pat dry with a towel.             - DO NOT apply any lotions, oils, powders to the incision.             - No tub baths / swimming for at least 2 weeks.  If you notice any fevers, chills, increased pain, swelling, bleeding or pus, please contact your doctor.   ADDITIONAL INFORMATION  If you are going to have an upcoming dental procedure, please contact our office as you will require antibiotics ahead of time to prevent infection on your heart valve.    If you have any questions  or concerns you can call the structural heart phone during normal business hours 8am-4pm. If you have an urgent need after hours or weekends please call [phone] to talk to the on call provider for general cardiology. If you have an emergency that requires immediate attention, please call 911.    After TAVR Checklist  Check  Test Description   Follow up appointment in 1-2 weeks  You will see our structural heart advanced practice provider. Your incision sites will be checked and you will be cleared to drive and resume all normal activities if you are doing well.     1 month echo and follow up  You will have an echo to check on your new heart valve and be seen back in the office by a structural heart advanced practice provider.   Follow up with your primary cardiologist You will need to be seen by your primary cardiologist in the following 3-6 months after your 1 month appointment in the valve clinic.    1 year echo and follow up You will have another echo to check on your heart valve after 1 year  and be seen back in the office by a structural heart advanced practice provider. This your last structural heart visit.   Bacterial endocarditis prophylaxis  You will have to take antibiotics for the rest of your life before all dental procedures (even teeth cleanings) to protect your heart valve. Antibiotics are also required before some surgeries. Please check with your cardiologist before scheduling any surgeries. Also, please make sure to tell us if you have a penicillin allergy as you will require an alternative antibiotic.

## 2023-02-04 NOTE — Op Note (Signed)
[facility]   MULTIDISCIPLINARY HEART VALVE TEAM     TAVR OPERATIVE NOTE   NAME@ [id number]  Date of Procedure:                 02/04/2023   Preoperative Diagnosis:      Severe Aortic Stenosis    Postoperative Diagnosis:    Same    Procedure:        Transcatheter Aortic Valve Replacement - Percutaneous right Transfemoral Approach             Medtronic Evolut FX  (size 29 mm, serial # [id number])              Co-Surgeons:            [name], MD and Alverda Skeans, MD     Anesthesiologist:                  Roslynn Amble, MD   Echocardiographer:              Charlton Haws, MD   Pre-operative Echo Findings: Severe aortic stenosis  Normal left ventricular systolic function   Post-operative Echo Findings: no paravalvular leak Normal left ventricular systolic function      BRIEF CLINICAL NOTE AND INDICATIONS FOR SURGERY   72 yo female with NYHA class I symptoms of severe AS with normal LV fucntion and no CAD. We had a very good discussion of the natural history of AS and the need for AVR when becoming symptomatic. She understands the issues of the different procedures of TAVR vs SAVR and the reasons why we prefer pts to have either and she understands. I told her the only real negative aspect of her having SAVR was her obesity but overall she chooses to undergo TAVR         DETAILS OF THE OPERATIVE PROCEDURE    PREPARATION:    The patient was brought to the operating room on the above mentioned date and appropriate monitoring was established by the anesthesia team. The patient was placed in the supine position on the operating table.  Intravenous antibiotics were administered. The patient was monitored closely throughout the procedure under conscious sedation.  Baseline transthoracic echocardiogram was performed. The patient's abdomen and both groins were prepped and draped in a sterile manner. A time out procedure was performed.   PERIPHERAL ACCESS:     Using the modified Seldinger technique, femoral arterial and venous access was obtained with placement of 6 Fr sheaths on the left  side groin and right internal jugular vein.  A pigtail diagnostic catheter was passed through the left arterial sheath under fluoroscopic guidance into the aortic root.  A temporary transvenous pacemaker catheter was passed through the right IJ venous sheath under fluoroscopic guidance into the right ventricle.  The pacemaker was tested to ensure stable lead placement and pacemaker capture. Aortic root angiography was performed in order to determine the optimal angiographic angle for valve deployment.    TRANSFEMORAL ACCESS:    Percutaneous transfemoral access and sheath placement was performed using ultrasound guidance.  The right common femoral artery was cannulated using a micropuncture needle.  A pair of Abbott Perclose percutaneous closure devices were placed and a 6 French sheath replaced into the femoral artery.  The patient was heparinized systemically and ACT verified > 250 seconds.     An 18 Fr transfemoral Gore Dry-Seal sheath was introduced into the right femoral artery after progressively dilating over an Amplatz superstiff  wire. An AL-1 catheter was used to direct a straight-tip exchange length wire across the native aortic valve into the left ventricle. This was exchanged out for a pigtail catheter and position was confirmed in the LV apex. Simultaneous LV and Ao pressures were recorded.  The pigtail catheter was exchanged for a Safari wire in the LV apex.    BALLOON AORTIC VALVULOPLASTY:    Was performed under rapid ventricular pacing using a 22 F balloon on one attempt and satisfactory valvuloplasty was obtained   TRANSCATHETER HEART VALVE DEPLOYMENT:    A Medtronic Evolut FX transcatheter heart valve (size 29 mm) was prepared and loaded into the delivery catheter system per manufacturer's guidelines and the proper orientation of the valve is  confirmed under fluoroscopy. The delivery system and inline sheath were inserted into the right common femoral artery over the [facility] wire and the inline sheath advanced into the abdominal aorta under fluoroscopic guidance. The delivery catheter was advanced around the aortic arch and the valve was carefully positioned across the aortic valve annulus. An aortic root injection was performed to confirm position and the valve deployed using cusp overlap technique under fluoroscopic guidance. Intermittent pacing was used during valve deployment. The delivery system and guidewire were retracted into the descending aorta and the nosecone re-sheathed. Valve function was assessed using echocardiography. There is felt to be no paravalvular leak and no central aortic insufficiency. The patient's hemodynamic recovery following valve deployment is good.        PROCEDURE COMPLETION:    The delivery system and in-line sheath were removed and femoral artery closure performed using the Perclose devices. A completion angiogram of the right femoral artery showed good closure and no stricture of the artery. Protamine was administered once femoral arterial repair was complete. The temporary pacemaker, pigtail catheters and femoral sheaths were removed with manual pressure used venous for hemostasis and a perclose closure device for contralateral arterial hemostasis.    The patient tolerated the procedure well and is transported to the cath lab recovery area in stable condition. There were no immediate intraoperative complications. All sponge instrument and needle counts are verified correct at completion of the operation.    No blood products were administered during the operation.   The patient received a total of 90 mL of intravenous contrast during the procedure.    [name], MD

## 2023-02-05 ENCOUNTER — Other Ambulatory Visit (HOSPITAL_COMMUNITY): Payer: Self-pay

## 2023-02-05 ENCOUNTER — Inpatient Hospital Stay (HOSPITAL_COMMUNITY): Payer: Medicare Other

## 2023-02-05 ENCOUNTER — Encounter (HOSPITAL_COMMUNITY): Payer: Self-pay | Admitting: Internal Medicine

## 2023-02-05 DIAGNOSIS — I5033 Acute on chronic diastolic (congestive) heart failure: Secondary | ICD-10-CM | POA: Insufficient documentation

## 2023-02-05 DIAGNOSIS — Z952 Presence of prosthetic heart valve: Secondary | ICD-10-CM

## 2023-02-05 DIAGNOSIS — Z6841 Body Mass Index (BMI) 40.0 and over, adult: Secondary | ICD-10-CM | POA: Diagnosis not present

## 2023-02-05 DIAGNOSIS — Z006 Encounter for examination for normal comparison and control in clinical research program: Secondary | ICD-10-CM | POA: Diagnosis not present

## 2023-02-05 DIAGNOSIS — I350 Nonrheumatic aortic (valve) stenosis: Secondary | ICD-10-CM | POA: Diagnosis not present

## 2023-02-05 MED ORDER — ASPIRIN 81 MG PO TBEC
81.0000 mg | DELAYED_RELEASE_TABLET | Freq: Every day | ORAL | Status: DC
  Administered 2023-02-05: 81 mg via ORAL
  Filled 2023-02-05: qty 1

## 2023-02-05 MED ORDER — FUROSEMIDE 20 MG PO TABS
20.0000 mg | ORAL_TABLET | Freq: Every day | ORAL | 0 refills | Status: DC
  Filled 2023-02-05: qty 30, 30d supply, fill #0

## 2023-02-05 MED ORDER — METOPROLOL SUCCINATE ER 25 MG PO TB24: 12.5000 mg | ORAL_TABLET | Freq: Every day | ORAL | Status: DC

## 2023-02-05 MED ORDER — ASPIRIN 81 MG PO TBEC
81.0000 mg | DELAYED_RELEASE_TABLET | Freq: Every day | ORAL | 3 refills | Status: AC
  Filled 2023-02-05: qty 120, 120d supply, fill #0

## 2023-02-05 MED ORDER — PERFLUTREN LIPID MICROSPHERE
1.0000 mL | INTRAVENOUS | Status: DC | PRN
  Administered 2023-02-05: 2 mL via INTRAVENOUS

## 2023-02-05 MED ORDER — METOPROLOL SUCCINATE ER 25 MG PO TB24
12.5000 mg | ORAL_TABLET | Freq: Every day | ORAL | 0 refills | Status: DC
Start: 2023-02-05 — End: 2023-03-05
  Filled 2023-02-05: qty 30, 60d supply, fill #0

## 2023-02-05 MED FILL — Verapamil HCl IV Soln 2.5 MG/ML: INTRAVENOUS | Qty: 2 | Status: AC

## 2023-02-05 NOTE — Progress Notes (Signed)
CARDIAC REHAB PHASE I   PRE:  Rate/Rhythm: 80 NSR  BP:  Sitting: 130/54      SaO2: 97 RA  MODE:  Ambulation: 470 ft   AD:   RW  POST:  Rate/Rhythm: 116 ST  BP:  Sitting: 94/64      SaO2: 97 RA  Pt amb with supervision assistance, pt reports improvement in SOB with increased distance. Pt has been exercising at home with RB an TM 3d/wk. Discussed HH diet, safe exercise intensity, and CRP2. Pt would like to be referred to program.   Charlene Tapia  ACSM-CEP 9:59 AM 02/05/2023    Service time is from 0925 to 0959.

## 2023-02-05 NOTE — TOC Transition Note (Signed)
Transition of Care (TOC) - CM/SW Discharge Note Donn Pierini RN, BSN Transitions of Care Unit 4E- RN Case Manager See Treatment Team for direct phone #   Patient Details  Name: Charlene Tapia MRN: [id number] Date of Birth: [birth year]  Transition of Care [facility]) CM/SW Contact:  Darrold Span, RN Phone Number: 02/05/2023, 11:44 AM   Clinical Narrative:    Pt stable for transition home today, per bedside RN - pt has RW at home- no DME needs for discharge.   Pt has transportation home w/family, no TOC needs noted.    Final next level of care: Home/Self Care Barriers to Discharge: No Barriers Identified   Patient Goals and CMS Choice CMS Medicare.gov Compare Post Acute Care list provided to:: Patient Choice offered to / list presented to : NA  Discharge Placement                 Home        Discharge Plan and Services Additional resources added to the After Visit Summary for   In-house Referral: NA Discharge Planning Services: NA Post Acute Care Choice: NA          DME Arranged: N/A DME Agency: NA       HH Arranged: NA HH Agency: NA        Social Determinants of Health (SDOH) Interventions SDOH Screenings   Food Insecurity: No Food Insecurity (09/25/2021)  Depression (PHQ2-9): Low Risk  (11/01/2022)  Tobacco Use: Low Risk  (02/04/2023)     Readmission Risk Interventions    02/05/2023   11:44 AM  Readmission Risk Prevention Plan  Post Dischage Appt Complete  Medication Screening Complete  Transportation Screening Complete

## 2023-02-05 NOTE — Progress Notes (Signed)
Explained discharge instructions to patient. Reviewed follow up appointment and next medication administration times. Also reviewed education. Patient verbalized having an understanding for instructions given. All belongings are in the patient's possession. Will pick TOC meds up on the way to the discharge lounge. IV and telemetry were removed from by patient's RN. No other needs verbalized. Transporting downstairs for discharge.

## 2023-02-05 NOTE — Anesthesia Postprocedure Evaluation (Signed)
Anesthesia Post Note  Patient: Charlene Tapia  Procedure(s) Performed: Transcatheter Aortic Valve Replacement, Transfemoral INTRAOPERATIVE TRANSTHORACIC ECHOCARDIOGRAM     Patient location during evaluation: PACU Anesthesia Type: MAC Level of consciousness: awake and alert Pain management: pain level controlled Vital Signs Assessment: post-procedure vital signs reviewed and stable Respiratory status: spontaneous breathing, nonlabored ventilation, respiratory function stable and patient connected to nasal cannula oxygen Cardiovascular status: stable and blood pressure returned to baseline Postop Assessment: no apparent nausea or vomiting Anesthetic complications: no   There were no known notable events for this encounter.  Last Vitals:  Vitals:   02/05/23 0000 02/05/23 0400  BP: (!) 124/55 (!) 128/55  Pulse: 72 71  Resp: 17 17  Temp: 36.6 C 36.7 C  SpO2: 94% 94%    Last Pain:  Vitals:   02/05/23 0400  TempSrc: Oral  PainSc:                  Mariann Barter

## 2023-02-06 ENCOUNTER — Telehealth (HOSPITAL_COMMUNITY): Payer: Self-pay

## 2023-02-06 ENCOUNTER — Telehealth: Payer: Self-pay | Admitting: Cardiology

## 2023-02-06 ENCOUNTER — Other Ambulatory Visit (HOSPITAL_COMMUNITY): Payer: Self-pay

## 2023-02-06 NOTE — Telephone Encounter (Signed)
Attempted to call patient in regards to Cardiac Rehab - LM on VM 

## 2023-02-06 NOTE — Telephone Encounter (Signed)
  [facility]   MULTIDISCIPLINARY HEART VALVE TEAM   Patient contacted regarding discharge from [facility] on 02/05/23. Patient doing well with no complaints today. Groin site stable.    Patient understands to follow up with provider Georgie Chard on 9/23  Patient understands discharge instructions? Yes  Patient understands medications and regimen? Yes  Patient understands to bring all medications to this visit? Yes   Georgie Chard NP-C Structural Heart Team  Pager: [phone]

## 2023-02-06 NOTE — Telephone Encounter (Signed)
Pt insurance is active and benefits verified through Medicare A/B. Co-pay $0.00, DED $240.00/$240.00 met, out of pocket $0.00/$0.00 met, co-insurance 20%. No pre-authorization required. Passport, 02/06/23 @ 2:23PM, REF#[id number]   How many CR sessions are covered? (36 visits for TCR, 72 visits for ICR)72 Is this a lifetime maximum or an annual maximum? Lifetime Has the member used any of these services to date? No Is there a time limit (weeks/months) on start of program and/or program completion? No   2ndary insurance is active and benefits verified through [employer]. Co-pay $0.00, DED $0.00/$0.00 met, out of pocket $0.00/$0.00 met, co-insurance 0%. No pre-authorization required. Passport, 02/06/23 @ 2:27PM, REF#[id number]    Will contact patient to see if she is interested in the Cardiac Rehab Program. If interested, patient will need to complete follow up appt. Once completed, patient will be contacted for scheduling upon review by the RN Navigator.

## 2023-02-07 ENCOUNTER — Telehealth (HOSPITAL_COMMUNITY): Payer: Self-pay

## 2023-02-07 ENCOUNTER — Encounter (HOSPITAL_COMMUNITY): Payer: Self-pay

## 2023-02-07 ENCOUNTER — Other Ambulatory Visit (HOSPITAL_COMMUNITY): Payer: Self-pay

## 2023-02-07 NOTE — Progress Notes (Unsigned)
HEART AND VASCULAR CENTER   MULTIDISCIPLINARY HEART VALVE TEAM  Structural Heart Office Note:  .   Date:  02/07/2023  ID:  Charlene Tapia, DOB 07/08/1950, MRN [id number] PCP: Elenore Paddy, NP  [facility] Providers Cardiologist:  Orbie Pyo, MD { Click to update primary MD,subspecialty MD or APP then REFRESH:1}    History of Present Illness: .    Charlene Tapia is a 72 y.o. female with a history of HTN, morbid obesity (BMI 48), and severe AS with moderate AI who presented to [facility] on 02/04/23 for planned TAVR.    She was recenlty noted to have a heart murmur by PCP which prompted an echo. Echo 11/20/22 showed EF 60% and severe mean grad 49 mmHg, AVA 1.02 cm2 (felt to be over-estimated based on an inaccurate LVOT diameter), moderate AI, moderate MS/mild MR. [facility] 11/25/22 showed minimal CAD   She was evaluated by the multidisciplinary valve team and felt to have severe, symptomatic aortic stenosis and to be a suitable candidate for TAVR, which was set up for 02/04/23.    Severe AS: s/p successful TAVR with a 29 mm Evolut FX THV via the TF approach on 02/04/23. Post operative echo completed but pending formal read. Groin sites are stable. ECG with sinus and no high grade heart block. Started on a baby Asprin 81mg  daily. Walked with cardiac rehab with no issues. Plan for discharge home today with close follow up in the outpatient setting.    Acute on chronic HFpEF: as evidenced by an elevated LVEDP of 27 mmHg at the time of TAVR. Treated with IV lasix 40mg /Kdur x1. Will start Lasix 20 mg daily at discharge. Plan for follow up BMET and mag at follow up.    NSVT: 9 beat run noted on tele. K and mag WNL. Started on Toprol XL 12.5mg  at bedtime.    HTN: BP well controlled. Resume home Norvasc 5mg  daily. As above, started on Toprol XL 12.5mg  QHS and Lasix 20mg  daily.    Morbid obesity:  Body mass index is 48.67 kg/m. Work on diet and exercise.    Lymphadenopathy: pre TAVR CT  showed an "enlarged left-greater-than-right inguinal lymph nodes, could be reactive. Recommend follow-up with inguinal ultrasound in 6-12 weeks to ensure resolution." This will be discussed in the outpatient setting.       Studies Reviewed: .   Cardiac Studies & Procedures   CARDIAC CATHETERIZATION  CARDIAC CATHETERIZATION 11/25/2022  Narrative 1.  Minimal obstructive coronary artery disease. 2.  Fick cardiac output 10.8 liters per minute and Fick cardiac index of 5.0 liters per minute per square meter squared with the following hemodynamics:  Right atrial pressure mean 7 mmHg Right ventricular pressure 44/5 with an end-diastolic pressure of 15 mmHg Wedge pressure mean 18 mmHg Pulmonary pressure 41/18 with a mean of 28 mmHg Pulmonary vascular resistance of 1.2 Woods units  Recommendation: Continue evaluation for aortic valve intervention.  Findings Coronary Findings Diagnostic  Dominance: Right  Left Anterior Descending The vessel exhibits minimal luminal irregularities.  Left Circumflex The vessel exhibits minimal luminal irregularities.  Right Coronary Artery There is mild diffuse disease throughout the vessel.  Intervention  No interventions have been documented.     ECHOCARDIOGRAM  ECHOCARDIOGRAM COMPLETE 02/05/2023  Narrative ECHOCARDIOGRAM REPORT    Patient Name:   Charlene Tapia Date of Exam: 02/05/2023 Medical Rec #:  [id number]         Height:  62.0 in Accession #:    [id number]        Weight:       266.1 lb Date of Birth:  [birth year]         BSA:          2.158 m Patient Age:    72 years          BP:           132/52 mmHg Patient Gender: F                 HR:           83 bpm. Exam Location:  Inpatient  Procedure: 2D Echo, Color Doppler, Cardiac Doppler and Intracardiac Opacification Agent  Indications:    Post TAVR evaluation  History:        Patient has prior history of Echocardiogram examinations, most recent 02/04/2023. Aortic Valve  Disease and #29 Evolut FX TAVR implanted 02/04/23; Risk Factors:Hypertension and Dyslipidemia. Aortic Valve: 29 mm CoreValve-Evolut Pro prosthetic, stented (TAVR) valve is present in the aortic position. Procedure Date: 02/04/2023.  Sonographer:    Milbert Coulter Referring Phys: [id number] Janetta Hora   Sonographer Comments: Suboptimal apical window, suboptimal parasternal window and patient is obese. Image acquisition challenging due to patient body habitus. IMPRESSIONS   1. Left ventricular ejection fraction, by estimation, is 70 to 75%. The left ventricle has hyperdynamic function. The left ventricle has no regional wall motion abnormalities. Left ventricular diastolic function could not be evaluated. 2. Right ventricular systolic function is normal. The right ventricular size is normal. Tricuspid regurgitation signal is inadequate for assessing PA pressure. 3. Left atrial size was severely dilated. 4. Right atrial size was moderately dilated. 5. The mitral valve is degenerative. No evidence of mitral valve regurgitation. Moderate mitral stenosis. The mean mitral valve gradient is 8.0 mmHg with average heart rate of 82 bpm. Severe mitral annular calcification. 6. The aortic valve has been repaired/replaced. Aortic valve regurgitation is not visualized. There is a 29 mm CoreValve-Evolut Pro prosthetic (TAVR) valve present in the aortic position. Procedure Date: 02/04/2023. Echo findings are consistent with normal structure and function of the aortic valve prosthesis. Aortic valve mean gradient measures 17.7 mmHg. Aortic valve Vmax measures 2.79 m/s. Aortic valve acceleration time measures 66 msec. 7. The inferior vena cava is normal in size with greater than 50% respiratory variability, suggesting right atrial pressure of 3 mmHg.  FINDINGS Left Ventricle: Left ventricular ejection fraction, by estimation, is 70 to 75%. The left ventricle has hyperdynamic function. The left ventricle has no  regional wall motion abnormalities. Definity contrast agent was given IV to delineate the left ventricular endocardial borders. The left ventricular internal cavity size was normal in size. There is borderline concentric left ventricular hypertrophy. Left ventricular diastolic function could not be evaluated due to mitral stenosis. Left ventricular diastolic function could not be evaluated.  Right Ventricle: The right ventricular size is normal. No increase in right ventricular wall thickness. Right ventricular systolic function is normal. Tricuspid regurgitation signal is inadequate for assessing PA pressure.  Left Atrium: Left atrial size was severely dilated.  Right Atrium: Right atrial size was moderately dilated.  Pericardium: There is no evidence of pericardial effusion.  Mitral Valve: The mitral valve is degenerative in appearance. Severe mitral annular calcification. No evidence of mitral valve regurgitation. Moderate mitral valve stenosis. MV peak gradient, 15.1 mmHg. The mean mitral valve gradient is 8.0 mmHg with average heart rate of 82 bpm.  Tricuspid  Valve: The tricuspid valve is normal in structure. Tricuspid valve regurgitation is not demonstrated.  Aortic Valve: The aortic valve has been repaired/replaced. Aortic valve regurgitation is not visualized. Aortic valve mean gradient measures 17.7 mmHg. Aortic valve peak gradient measures 31.1 mmHg. Aortic valve area, by VTI measures 1.32 cm. There is a 29 mm CoreValve-Evolut Pro prosthetic, stented (TAVR) valve present in the aortic position. Procedure Date: 02/04/2023. Echo findings are consistent with normal structure and function of the aortic valve prosthesis.  Pulmonic Valve: The pulmonic valve was not well visualized. Pulmonic valve regurgitation is not visualized. No evidence of pulmonic stenosis.  Aorta: The aortic root and ascending aorta are structurally normal, with no evidence of dilitation.  Venous: The inferior vena  cava is normal in size with greater than 50% respiratory variability, suggesting right atrial pressure of 3 mmHg.  IAS/Shunts: No atrial level shunt detected by color flow Doppler.   LEFT VENTRICLE PLAX 2D LVIDd:         4.80 cm   Diastology LVIDs:         2.70 cm   LV e' medial:    4.57 cm/s LV PW:         1.20 cm   LV E/e' medial:  32.2 LV IVS:        1.10 cm   LV e' lateral:   9.68 cm/s LVOT diam:     1.70 cm   LV E/e' lateral: 15.2 LV SV:         67 LV SV Index:   31 LVOT Area:     2.27 cm   RIGHT VENTRICLE RV S prime:     19.30 cm/s TAPSE (M-mode): 2.5 cm  LEFT ATRIUM              Index        RIGHT ATRIUM           Index LA diam:        4.50 cm  2.08 cm/m   RA Area:     21.70 cm LA Vol (A2C):   123.0 ml 56.99 ml/m  RA Volume:   69.60 ml  32.25 ml/m LA Vol (A4C):   99.7 ml  46.19 ml/m LA Biplane Vol: 115.0 ml 53.28 ml/m AORTIC VALVE AV Area (Vmax):    1.32 cm AV Area (Vmean):   1.25 cm AV Area (VTI):     1.32 cm AV Vmax:           279.00 cm/s AV Vmean:          193.667 cm/s AV VTI:            0.510 m AV Peak Grad:      31.1 mmHg AV Mean Grad:      17.7 mmHg LVOT Vmax:         162.00 cm/s LVOT Vmean:        107.000 cm/s LVOT VTI:          0.297 m LVOT/AV VTI ratio: 0.58  AORTA Ao Asc diam: 2.90 cm  MITRAL VALVE MV Area (PHT): 1.98 cm     SHUNTS MV Area VTI:   1.19 cm     Systemic VTI:  0.30 m MV Peak grad:  15.1 mmHg    Systemic Diam: 1.70 cm MV Mean grad:  8.0 mmHg MV Vmax:       1.94 m/s MV Vmean:      139.0 cm/s MV Decel Time: 384 msec MV E velocity: 147.00  cm/s MV A velocity: 174.00 cm/s MV E/A ratio:  0.84  Mihai Croitoru MD Electronically signed by Thurmon Fair MD Signature Date/Time: 02/05/2023/1:47:03 PM    Final     CT SCANS  CT CORONARY MORPH W/CTA COR W/SCORE 12/02/2022  Addendum 12/04/2022 11:48 AM ADDENDUM REPORT: 12/04/2022 11:45  CLINICAL DATA:  Aortic Valve pathology with assessment for TAVR  EXAM: Cardiac TAVR  CT  TECHNIQUE: The patient was scanned on a Siemens Force 192 slice scanner. A 120 kV retrospective scan was triggered in the descending thoracic aorta at 111 HU's. Gantry rotation speed was 270 msecs and collimation was .9 mm. No beta blockade or nitro were given. The 3D data set was reconstructed in 5% intervals of the R-R cycle. Systolic and diastolic phases were analyzed on a dedicated work station using MPR, MIP and VRT modes. The patient received 100 cc of contrast.  FINDINGS: Aortic Valve: Severely thickened tri-leaflet aortic valve with heavy calcification and reduced excursion the planimeter valve area is 1.04 Sq cm consistent with moderate to severe aortic stenosis  Annular calcification: Severe with multiple calcifications and LVOT calcification.  Aortic Valve Calcium Score: 4960  Presence of basal septal hypertrophy: Yes, 18 mm  Systolic phase measurements are greater than diastolic phase imaging  Perimembranous septal diameter: 6 mm  Mitral Valve: Severe calcification with MAC and caseous posterior MAC and intra-valvular fibrosa calcification  Aortic Annulus Measurements- 25%  Major annulus diameter: 26 mm  Minor annulus diameter:23 mm  Annular perimeter: 78 mm  Annular area: 4.67 cm2.  Aortic Root Measurements- 75%  Sinotubular Junction: 31 mm  Ascending Thoracic Aorta: 37 mm  Aortic Arch: 26 mm  Descending Thoracic Aorta: 24 mm  Sinus of Valsalva Measurements:  Right coronary cusp width: 32 mm  Left coronary cusp width: 33 mm  Non coronary cusp width: 33 mm  Coronary Artery Height above Annulus:  Left Main: 15 mm  Left SoV height: 23 mm  Right Coronary: 20 mm  Right SoV height: 23 mm  Optimum Fluoroscopic Angle for Delivery: LAO 2, CAU 2  Valves for structural team consideration: 26 mm Sapien Valve vs 29 mm Evolut Valve  Non TAVR Valve Findings:  Coronary Arteries: Normal coronary origin. Study not completed  with nitroglycerin.  Coronary Calcium Score:  Left main: 0  Left anterior descending artery: 43  Left circumflex artery: 0  Right coronary artery: 33  Total: 76  Percentile: 69th for age, sex, and race matched control.  Systemic veins: Normal anatomy  Main Pulmonary artery: Dilated 30 mm  Pulmonary veins: Normal anatomy  Left atrial appendage: Patent  Interatrial septum: No communications  Left ventricle: Normal size  Left atrium: Dilated  Right ventricle: Normal size  Right atrium: Normal size  Pericardium: No calcifications  Extra Cardiac Findings as per separate reporting.  Notable artifacts: Poor contrast opacification and body attenuation artifact; study is interpretable for measurements  IMPRESSION: 1. Moderate to severe aortic stenosis. Findings pertinent to TAVR procedure are detailed above.  RECOMMENDATIONS:  The proposed cut-off value of 1,651 AU yielded a 93 % sensitivity and 75 % specificity in grading AS severity in patients with classical low-flow, low-gradient AS. Proposed different cut-off values to define severe AS for men and women as 2,065 AU and 1,274 AU, respectively. The joint European and American recommendations for the assessment of AS consider the aortic valve calcium score as a continuum - a very high calcium score suggests severe AS and a low calcium score suggests severe AS is  unlikely.  Sunday Shams, et al. 2017 ESC/EACTS Guidelines for the management of valvular heart disease. Eur Heart J [phone]  Coronary artery calcium (CAC) score is a strong predictor of incident coronary heart disease (CHD) and provides predictive information beyond traditional risk factors. CAC scoring is reasonable to use in the decision to withhold, postpone, or initiate statin therapy in intermediate-risk or selected borderline-risk asymptomatic adults (age 85-75 years and LDL-C >=70 to <190 mg/dL) who do not have  diabetes or established atherosclerotic cardiovascular disease (ASCVD).* In intermediate-risk (10-year ASCVD risk >=7.5% to <20%) adults or selected borderline-risk (10-year ASCVD risk >=5% to <7.5%) adults in whom a CAC score is measured for the purpose of making a treatment decision the following recommendations have been made:  If CAC = 0, it is reasonable to withhold statin therapy and reassess in 5 to 10 years, as long as higher risk conditions are absent (diabetes mellitus, family history of premature CHD in first degree relatives (males <55 years; females <65 years), cigarette smoking, LDL >=190 mg/dL or other independent risk factors).  If CAC is 1 to 99, it is reasonable to initiate statin therapy for patients >=85 years of age.  If CAC is >=100 or >=75th percentile, it is reasonable to initiate statin therapy at any age.  Cardiology referral should be considered for patients with CAC scores >=400 or >=75th percentile.  *2018 AHA/ACC/AACVPR/AAPA/ABC/ACPM/[name]/AGS/APhA/ASPC/NLA/PCNA Guideline on the Management of Blood Cholesterol: A Report of the American College of Cardiology/American Heart Association Task Force on Clinical Practice Guidelines. J Am Coll Cardiol. 2019;73(24):3168-3209.  Mahesh  Chandrasekhar   Electronically Signed By: Riley Lam M.D. On: 12/04/2022 11:45  Narrative EXAM: OVER-READ INTERPRETATION  CT CHEST  The following report is a limited chest CT over-read performed by radiologist Dr. Allegra Lai of [facility] on 12/02/2022. This over-read does not include interpretation of cardiac or coronary anatomy or pathology. The cardiac TAVR interpretation by the cardiologist is attached.  COMPARISON:  None Available.  FINDINGS: Extracardiac findings will be described separately under dictation for contemporaneously obtained CTA chest, abdomen and pelvis.  IMPRESSION: Please see separate dictation for  contemporaneously obtained CTA chest, abdomen and pelvis dated 12/02/2022 for full description of relevant extracardiac findings.  Electronically Signed: By: Allegra Lai M.D. On: 12/02/2022 12:11            Risk Assessment/Calculations:   {Does this patient have ATRIAL FIBRILLATION?:[phone]} No BP recorded.  {Refresh Note OR Click here to enter BP  :1}***         {This patient may be at risk for Amyloid.  Click HERE to open Cardiac Amyloid Screening SmartSet to order screening OR Click HERE to defer testing for 1 year or permanently :1}      Physical Exam:   VS:  There were no vitals taken for this visit.   Wt Readings from Last 3 Encounters:  02/05/23 266 lb 1.6 oz (120.7 kg)  12/09/22 269 lb (122 kg)  11/25/22 269 lb (122 kg)     General: Well developed, well nourished, NAD Skin: Warm, dry, intact  Head: Normocephalic, atraumatic, sclera non-icteric, no xanthomas, clear, moist mucus membranes. Neck: Negative for carotid bruits. No JVD Lungs:Clear to ausculation bilaterally. No wheezes, rales, or rhonchi. Breathing is unlabored. Cardiovascular: RRR with S1 S2. No murmurs, rubs, gallops, or LV heave appreciated. Abdomen: Soft, non-tender, non-distended with normoactive bowel sounds. No hepatomegaly, No rebound/guarding. No obvious abdominal masses. MSK: Strength and tone appear normal for  age. 5/5 in all extremities Extremities: No edema. No clubbing or cyanosis. DP/PT pulses 2+ bilaterally Neuro: Alert and oriented. No focal deficits. No facial asymmetry. MAE spontaneously. Psych: Responds to questions appropriately with normal affect.    ASSESSMENT AND PLAN: .   *** {The patient has an active order for outpatient cardiac rehabilitation.   Please indicate if the patient is ready to start. Do NOT delete this.  It will auto delete.  Refresh note, then sign.              Click here to document readiness and see contraindications.  :1}  Cardiac Rehabilitation  Eligibility Assessment      {Are you ordering a CV Procedure (e.g. stress test, cath, DCCV, TEE, etc)?   Press F2        :[id number]}  Dispo: ***  Signed, Georgie Chard, NP

## 2023-02-07 NOTE — Telephone Encounter (Signed)
Attempted to call patient in regards to Cardiac Rehab - LM on VM Mailed letter

## 2023-02-10 ENCOUNTER — Ambulatory Visit: Payer: Medicare Other | Attending: Cardiology | Admitting: Cardiology

## 2023-02-10 ENCOUNTER — Other Ambulatory Visit: Payer: Self-pay

## 2023-02-10 ENCOUNTER — Other Ambulatory Visit (HOSPITAL_COMMUNITY): Payer: Self-pay

## 2023-02-10 VITALS — BP 130/60 | HR 75 | Ht 62.5 in | Wt 265.4 lb

## 2023-02-10 DIAGNOSIS — E785 Hyperlipidemia, unspecified: Secondary | ICD-10-CM | POA: Diagnosis present

## 2023-02-10 DIAGNOSIS — I10 Essential (primary) hypertension: Secondary | ICD-10-CM | POA: Insufficient documentation

## 2023-02-10 DIAGNOSIS — I447 Left bundle-branch block, unspecified: Secondary | ICD-10-CM | POA: Insufficient documentation

## 2023-02-10 DIAGNOSIS — Z952 Presence of prosthetic heart valve: Secondary | ICD-10-CM | POA: Diagnosis not present

## 2023-02-10 DIAGNOSIS — I350 Nonrheumatic aortic (valve) stenosis: Secondary | ICD-10-CM | POA: Insufficient documentation

## 2023-02-10 DIAGNOSIS — I4729 Other ventricular tachycardia: Secondary | ICD-10-CM | POA: Diagnosis present

## 2023-02-10 DIAGNOSIS — I5033 Acute on chronic diastolic (congestive) heart failure: Secondary | ICD-10-CM | POA: Diagnosis present

## 2023-02-10 LAB — BASIC METABOLIC PANEL
BUN/Creatinine Ratio: 28 (ref 12–28)
BUN: 18 mg/dL (ref 8–27)
CO2: 27 mmol/L (ref 20–29)
Calcium: 9.7 mg/dL (ref 8.7–10.3)
Chloride: 101 mmol/L (ref 96–106)
Creatinine, Ser: 0.65 mg/dL (ref 0.57–1.00)
Glucose: 97 mg/dL (ref 70–99)
Potassium: 4.5 mmol/L (ref 3.5–5.2)
Sodium: 139 mmol/L (ref 134–144)
eGFR: 95 mL/min/{1.73_m2} (ref >59)

## 2023-02-10 LAB — MAGNESIUM: Magnesium: 1.9 mg/dL (ref 1.6–2.3)

## 2023-02-10 MED ORDER — AMOXICILLIN 500 MG PO CAPS
ORAL_CAPSULE | ORAL | 6 refills | Status: DC
  Filled 2023-02-10 – 2023-03-06 (×3): qty 12, 3d supply, fill #0

## 2023-02-10 NOTE — Patient Instructions (Addendum)
Medication Instructions:  Start Amoxicillin 500 mg, take 4 tablets by mouth 1 hour prior to dental procedures and cleanings.   *If you need a refill on your cardiac medications before your next appointment, please call your pharmacy*   Lab Work: BMET, MAG - Today   If you have labs (blood work) drawn today and your tests are completely normal, you will receive your results only by: MyChart Message (if you have MyChart) OR A paper copy in the mail If you have any lab test that is abnormal or we need to change your treatment, we will call you to review the results.   Testing/Procedures: Follow up as planned    Follow-Up: Follow up as planned   Other Instructions

## 2023-02-10 NOTE — Telephone Encounter (Signed)
Completed Matrix form scanned to chart. Billing notified.

## 2023-02-13 DIAGNOSIS — R42 Dizziness and giddiness: Secondary | ICD-10-CM

## 2023-02-13 NOTE — Telephone Encounter (Signed)
Called pt in regards to report of dizziness from my chart message. Reports had an OV with Julien Girt on 02/10/23.  Was told to report episodes of dizziness d/t LBB. Yesterday pt had 2 episodes of extreme dizziness and lightheadedness.  1st episode was sitting on couch getting ready to stand up.  2nd event had been on exercise bike felt like would pass out.   9/25- 8:25 am 125/63-75 1pm 106/49-82           6 pm 12/57-93  9/26- 6:30 am 126/62-75   9:57am 137/75-90  Reports did not take amlodipine last night due to low BP at 1 pm.    Denies SOB, fatigue, funny heart beats.  Advised provider is not in the office but will send to be addressed.  If symptoms persist or become worrisome to set emergent care.

## 2023-02-14 ENCOUNTER — Ambulatory Visit: Payer: Medicare Other | Attending: Cardiology

## 2023-02-14 DIAGNOSIS — R42 Dizziness and giddiness: Secondary | ICD-10-CM

## 2023-02-14 NOTE — Progress Notes (Unsigned)
Enrolled patient for a 14 day Zio XT monitor to be mailed to patients home  Thukkani to read

## 2023-02-17 ENCOUNTER — Emergency Department (HOSPITAL_COMMUNITY): Payer: Medicare Other

## 2023-02-17 ENCOUNTER — Other Ambulatory Visit: Payer: Self-pay

## 2023-02-17 ENCOUNTER — Emergency Department (HOSPITAL_BASED_OUTPATIENT_CLINIC_OR_DEPARTMENT_OTHER): Payer: Medicare Other

## 2023-02-17 ENCOUNTER — Telehealth: Payer: Self-pay | Admitting: Internal Medicine

## 2023-02-17 ENCOUNTER — Encounter (HOSPITAL_COMMUNITY)
Admission: EM | Disposition: A | Discharge status: Home / Self Care | Payer: Self-pay | Source: Home / Self Care | Attending: Cardiology

## 2023-02-17 ENCOUNTER — Inpatient Hospital Stay (HOSPITAL_COMMUNITY)
Admission: EM | Admit: 2023-02-17 | Discharge: 2023-02-19 | DRG: 243 | Disposition: A | Discharge status: Home / Self Care | Payer: Medicare Other | Attending: Cardiology | Admitting: Cardiology

## 2023-02-17 ENCOUNTER — Encounter (HOSPITAL_COMMUNITY): Payer: Self-pay

## 2023-02-17 ENCOUNTER — Inpatient Hospital Stay (HOSPITAL_COMMUNITY): Payer: Medicare Other

## 2023-02-17 DIAGNOSIS — M7989 Other specified soft tissue disorders: Secondary | ICD-10-CM

## 2023-02-17 DIAGNOSIS — E7800 Pure hypercholesterolemia, unspecified: Secondary | ICD-10-CM | POA: Diagnosis present

## 2023-02-17 DIAGNOSIS — I441 Atrioventricular block, second degree: Secondary | ICD-10-CM | POA: Diagnosis not present

## 2023-02-17 DIAGNOSIS — Z1152 Encounter for screening for COVID-19: Secondary | ICD-10-CM

## 2023-02-17 DIAGNOSIS — I472 Ventricular tachycardia, unspecified: Secondary | ICD-10-CM | POA: Diagnosis present

## 2023-02-17 DIAGNOSIS — Z96653 Presence of artificial knee joint, bilateral: Secondary | ICD-10-CM | POA: Diagnosis present

## 2023-02-17 DIAGNOSIS — Z888 Allergy status to other drugs, medicaments and biological substances status: Secondary | ICD-10-CM

## 2023-02-17 DIAGNOSIS — I10 Essential (primary) hypertension: Secondary | ICD-10-CM | POA: Diagnosis present

## 2023-02-17 DIAGNOSIS — Z6841 Body Mass Index (BMI) 40.0 and over, adult: Secondary | ICD-10-CM | POA: Diagnosis not present

## 2023-02-17 DIAGNOSIS — Z811 Family history of alcohol abuse and dependence: Secondary | ICD-10-CM

## 2023-02-17 DIAGNOSIS — I447 Left bundle-branch block, unspecified: Secondary | ICD-10-CM | POA: Diagnosis present

## 2023-02-17 DIAGNOSIS — R001 Bradycardia, unspecified: Secondary | ICD-10-CM | POA: Diagnosis present

## 2023-02-17 DIAGNOSIS — I499 Cardiac arrhythmia, unspecified: Principal | ICD-10-CM

## 2023-02-17 DIAGNOSIS — R42 Dizziness and giddiness: Secondary | ICD-10-CM

## 2023-02-17 DIAGNOSIS — Z8249 Family history of ischemic heart disease and other diseases of the circulatory system: Secondary | ICD-10-CM | POA: Diagnosis not present

## 2023-02-17 DIAGNOSIS — Z7982 Long term (current) use of aspirin: Secondary | ICD-10-CM | POA: Diagnosis not present

## 2023-02-17 DIAGNOSIS — Z953 Presence of xenogenic heart valve: Secondary | ICD-10-CM | POA: Diagnosis not present

## 2023-02-17 DIAGNOSIS — Z79899 Other long term (current) drug therapy: Secondary | ICD-10-CM | POA: Diagnosis not present

## 2023-02-17 HISTORY — PX: TEMPORARY PACEMAKER: CATH118268

## 2023-02-17 LAB — RESP PANEL BY RT-PCR (RSV, FLU A&B, COVID)  RVPGX2
Influenza A by PCR: NEGATIVE
Influenza B by PCR: NEGATIVE
Resp Syncytial Virus by PCR: NEGATIVE
SARS Coronavirus 2 by RT PCR: NEGATIVE

## 2023-02-17 LAB — CBC
HCT: 35.1 % — ABNORMAL LOW (ref 36.0–46.0)
Hemoglobin: 11.1 g/dL — ABNORMAL LOW (ref 12.0–15.0)
MCH: 28.8 pg (ref 26.0–34.0)
MCHC: 31.6 g/dL (ref 30.0–36.0)
MCV: 90.9 fL (ref 80.0–100.0)
Platelets: 171 10*3/uL (ref 150–400)
RBC: 3.86 MIL/uL — ABNORMAL LOW (ref 3.87–5.11)
RDW: 13.7 % (ref 11.5–15.5)
WBC: 6.1 10*3/uL (ref 4.0–10.5)
nRBC: 0 % (ref 0.0–0.2)

## 2023-02-17 LAB — BASIC METABOLIC PANEL
Anion gap: 11 (ref 5–15)
BUN: 21 mg/dL (ref 8–23)
CO2: 25 mmol/L (ref 22–32)
Calcium: 8.9 mg/dL (ref 8.9–10.3)
Chloride: 103 mmol/L (ref 98–111)
Creatinine, Ser: 0.98 mg/dL (ref 0.44–1.00)
GFR, Estimated: >60 mL/min (ref >60)
Glucose, Bld: 124 mg/dL — ABNORMAL HIGH (ref 70–99)
Potassium: 3.8 mmol/L (ref 3.5–5.1)
Sodium: 139 mmol/L (ref 135–145)

## 2023-02-17 LAB — TSH: TSH: 1.294 u[IU]/mL (ref 0.350–4.500)

## 2023-02-17 LAB — TROPONIN I (HIGH SENSITIVITY)
Troponin I (High Sensitivity): 27 ng/L — ABNORMAL HIGH (ref <18)
Troponin I (High Sensitivity): 23 ng/L — ABNORMAL HIGH (ref <18)

## 2023-02-17 LAB — BRAIN NATRIURETIC PEPTIDE: B Natriuretic Peptide: 400.4 pg/mL — ABNORMAL HIGH (ref 0.0–100.0)

## 2023-02-17 LAB — MAGNESIUM: Magnesium: 1.9 mg/dL (ref 1.7–2.4)

## 2023-02-17 SURGERY — TEMPORARY PACEMAKER
Anesthesia: LOCAL

## 2023-02-17 MED ORDER — LIDOCAINE HCL (PF) 1 % IJ SOLN
INTRAMUSCULAR | Status: AC
  Filled 2023-02-17: qty 30

## 2023-02-17 MED ORDER — OXYCODONE HCL 5 MG PO TABS
5.0000 mg | ORAL_TABLET | Freq: Once | ORAL | Status: AC
  Administered 2023-02-17: 5 mg via ORAL
  Filled 2023-02-17: qty 1

## 2023-02-17 MED ORDER — LIDOCAINE HCL (PF) 1 % IJ SOLN
INTRAMUSCULAR | Status: DC | PRN
  Administered 2023-02-17: 2 mL via INTRADERMAL

## 2023-02-17 MED ORDER — INFLUENZA VAC A&B SURF ANT ADJ 0.5 ML IM SUSY
0.5000 mL | PREFILLED_SYRINGE | INTRAMUSCULAR | Status: DC
  Filled 2023-02-17: qty 0.5

## 2023-02-17 MED ORDER — IOHEXOL 350 MG/ML SOLN
75.0000 mL | Freq: Once | INTRAVENOUS | Status: AC | PRN
  Administered 2023-02-17: 75 mL via INTRAVENOUS

## 2023-02-17 MED ORDER — CHLORHEXIDINE GLUCONATE CLOTH 2 % EX PADS
6.0000 | MEDICATED_PAD | Freq: Every day | CUTANEOUS | Status: DC
  Administered 2023-02-17 – 2023-02-19 (×3): 6 via TOPICAL

## 2023-02-17 MED ORDER — FLUTICASONE PROPIONATE 50 MCG/ACT NA SUSP: 2.0000 | Freq: Every day | NASAL | Status: DC | PRN

## 2023-02-17 MED ORDER — SODIUM CHLORIDE 0.9 % IV SOLN
INTRAVENOUS | Status: AC | PRN
  Administered 2023-02-17: 10 mL/h via INTRAVENOUS

## 2023-02-17 MED ORDER — ONDANSETRON HCL 4 MG/2ML IJ SOLN: 4.0000 mg | Freq: Once | INTRAMUSCULAR | Status: DC

## 2023-02-17 MED ORDER — HEPARIN (PORCINE) IN NACL 1000-0.9 UT/500ML-% IV SOLN
INTRAVENOUS | Status: DC | PRN
  Administered 2023-02-17: 500 mL

## 2023-02-17 MED ORDER — ORAL CARE MOUTH RINSE: 15.0000 mL | OROMUCOSAL | Status: DC | PRN

## 2023-02-17 SURGICAL SUPPLY — 7 items
CATH-GARD ARROW CATH SHIELD (MISCELLANEOUS) ×1
KIT MICROPUNCTURE NIT STIFF (SHEATH) IMPLANT
PACK CARDIAC CATHETERIZATION (CUSTOM PROCEDURE TRAY) IMPLANT
SHEATH PINNACLE 6F 10CM (SHEATH) IMPLANT
SHEATH PROBE COVER 6X72 (BAG) IMPLANT
SHIELD CATHGARD ARROW (MISCELLANEOUS) IMPLANT
WIRE PACING TEMP ST TIP 5 (CATHETERS) IMPLANT

## 2023-02-17 NOTE — ED Notes (Signed)
Patient transported to CT 

## 2023-02-17 NOTE — Telephone Encounter (Signed)
STAT if patient feels like he/she is going to faint   Are you dizzy now? Yes, all the time she feels dizzy Do you feel faint or have you passed out? No  Do you have any other symptoms? Patient states she will get lightheaded and dizzy. Patient states it will start in her head and wash down her whole body like warm fluid. Patient states when this happens it will last for about 15-30 seconds and stop, but still feels dizzy afterwards. Patient states she can't walk to the car or drive. Patient is very concerned and is unsure what to do.   Have you checked your HR and BP (record if available)?

## 2023-02-17 NOTE — Interval H&P Note (Signed)
History and Physical Interval Note:  02/17/2023 5:45 PM  Charlene Tapia  has presented today for surgery, with the diagnosis of bradicardia.  The various methods of treatment have been discussed with the patient and family. After consideration of risks, benefits and other options for treatment, the patient has consented to  Procedure(s): TEMPORARY PACEMAKER (N/A) as a surgical intervention.  The patient's history has been reviewed, patient examined, no change in status, stable for surgery.  I have reviewed the patient's chart and labs.  Questions were answered to the patient's satisfaction.     [name]

## 2023-02-17 NOTE — ED Notes (Signed)
Cardiology at bedside.

## 2023-02-17 NOTE — Telephone Encounter (Signed)
FMLA paperwork was with APP Yetta Barre.

## 2023-02-17 NOTE — Progress Notes (Signed)
VASCULAR LAB    Right lower extremity venous duplex has been performed.  See CV proc for preliminary results.  Messaged negative results to Reatha Harps PA via secure chat  Sherren Kerns, RVT 02/17/2023, 2:04 PM

## 2023-02-17 NOTE — Telephone Encounter (Signed)
Reviewed with Charlene Tapia who recommends patient go to ED.  Patient notified.  She will have her husband transport her.  If symptoms worsen she will call 911. Patient is requesting FMLA paperwork be updated with return to work date being a few weeks from now.  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

## 2023-02-17 NOTE — Telephone Encounter (Signed)
I spoke with patient.  She reports dizziness is getting worse.  Has had 2 episodes today.  Dizzy when she stands up and moves around. Had a bad day yesterday.  Can't tell is she is having shortness of breath. Describes episode of feeling like someone is standing on her shoulders. She then feels hot feeling goes down her whole body.  Lasts about 15-30 seconds.  She feels nauseated with the episodes.  BP 125/50 and heart rate 44 this AM.   Will review with provider in office.  Patient advised to call 911 if symptoms worsen

## 2023-02-17 NOTE — Progress Notes (Signed)
Pt to cath lab.

## 2023-02-17 NOTE — H&P (Addendum)
ECHOCARDIOGRAM LIMITED REPORT   Patient Name:   Charlene Tapia Date of Exam: 02/04/2023 Medical Rec #:  [id number]         Height:       62.0 in Accession #:    [id number]        Weight:       269.0 lb Date of Birth:  [birth year]         BSA:          2.168 m Patient Age:    72 years          BP:           164/84 mmHg Patient Gender: F                 HR:           75 bpm. Exam Location:  Inpatient Procedure: Echo Assisted Procedure Indications:     TAVR  History:         Patient has prior history of Echocardiogram examinations, most                  recent 11/20/2022. Aortic Valve Disease; Risk                  Factors:Hypertension and Dyslipidemia.                  Aortic Valve: 29 mm Medtronic stented (TAVR) valve is present                  in the aortic position. Procedure Date: 02/04/23.  Sonographer:     Delcie Roch RDCS Referring Phys:  [id number] Orbie Pyo Diagnosing Phys: Charlton Haws MD IMPRESSIONS  1. Left ventricular ejection fraction, by estimation, is 60 to 65%. The left ventricle has normal function. There is mild left ventricular hypertrophy.  2. Left atrial size was moderately dilated.  3. Right atrial size was mildly dilated.  4. The mitral valve is degenerative. Trivial mitral valve regurgitation. Severe mitral annular calcification.  5. Pre TAVR: tri leaflet AV with severe calcification moderate AR and severe AS with mean gradient 50 peak 92 mmHg AVA 0.6 cm2         Post TAVR: well placed 29 mm supra annular Medtronic Evolut valve mean gradient 11 peak 20 mmHg AVA 2.3 cm2 no significant PVL after wire removed .  The aortic valve has been repaired/replaced. There is a 29 mm Medtronic stented (TAVR) valve present in the aortic position. Procedure Date: 02/04/23. FINDINGS  Left Ventricle: Left ventricular ejection fraction, by estimation, is 60 to 65%. The left ventricle has normal function. The left ventricular internal cavity size was normal in size. There is mild left ventricular hypertrophy. Left Atrium: Left atrial size was moderately dilated. Right Atrium: Right atrial size was mildly dilated. Pericardium: There is no evidence of pericardial effusion. Mitral Valve: The mitral valve is degenerative in appearance. There is severe thickening of the mitral valve leaflet(s). There is severe calcification of the mitral valve leaflet(s). Severe mitral annular calcification. Trivial mitral valve regurgitation. MV peak gradient, 13.7 mmHg. The mean mitral valve gradient is 7.0 mmHg. Tricuspid Valve: Tricuspid valve regurgitation is mild. Aortic Valve: Pre TAVR: tri leaflet AV with severe calcification moderate AR and severe AS with mean gradient 50 peak 92 mmHg AVA 0.6 cm2 Post TAVR: well placed 29 mm supra annular Medtronic Evolut valve mean gradient 11 peak 20 mmHg AVA 2.3 cm2 no significant PVL after wire removed. The  ELECTROPHYSIOLOGY H&P NOTE    Patient ID: JAKARI JACOT MRN: [id number], DOB/AGE: [birth year] 72 y.o.  Admit date: 02/17/2023 Date of Consult: 02/17/2023  Primary Physician: Elenore Paddy, NP Primary Cardiologist: Orbie Pyo, MD  Electrophysiologist: New   Referring Provider: Dr. Rush Landmark  Patient Profile: Charlene Tapia is a 72 y.o. female with a history of HTN, morbid obesity (BMI 48), and severe AS with moderate AI s/p TVAR 02/04/2023 who is being seen today for the evaluation of symptomatic bradycardia at the request of Dr. Rush Landmark.  HPI:  Charlene Tapia is a 72 y.o. female with medical history as above.   Ms. Talamante was recenlty noted to have a heart murmur by PCP which prompted an echo. Echo 11/20/22 showed EF 60% and severe mean grad 49 mmHg, AVA 1.02 cm2 (felt to be over-estimated based on an inaccurate LVOT diameter), moderate AI, moderate MS/mild MR. [facility] 11/25/22 showed minimal CAD   She was evaluated by the multidisciplinary valve team and felt to have severe, symptomatic aortic stenosis and to be a suitable candidate. She is now s/p successful TAVR with a 29 mm Evolut FX THV via the TF approach on 02/04/23. Post operative echo with hyperdynamic LV at 70-75% with moderate MS and stable TAVR valve function with a mean gradient at 17.96mmHg, 31.47mmHg, and AVA by VTI 1.32cm2. She was started on ASA 81mg  daily, Toprol 12.5mg , and Lasix 20mg  daily.   Seen in structural office 9/23 and was doing well, but noted to have new LBBB.   Called the office today with worsening lightheadedness and dizziness over the weekend, and several episodes of near syncope. Also with "chest pressure" and feeling like someone is pressing down on her.  Advised to report to ED and shown to be having bradycardia and intermittent second degree HB.   Case discussed with Dr. Lynnette Caffey who recommended pt be sent for temp wire with recent TAVR, and worsening conduction.   Pt currently feeling OK at  rest. Continues to have symptoms associated with her bradycardia. No frank syncope but came "very close" a few times.    Labs Potassium3.8 (09/30 1035) Magnesium  1.9 (09/30 1220) Creatinine, ser  0.98 (09/30 1035) PLT  171 (09/30 1035) HGB  11.1* (09/30 1035) WBC 6.1 (09/30 1035) Troponin I (High Sensitivity)23* (09/30 1220).    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 Lynnette Caffey and Dr. Leafy Ro   Severe aortic stenosis      Surgical History:  Past Surgical History:  Procedure Laterality Date   GANGLION CYST EXCISION Right 01/18/1989   INGUINAL HERNIA REPAIR Bilateral 1954   "age 13 month"   INTRAOPERATIVE TRANSTHORACIC ECHOCARDIOGRAM N/A 02/04/2023   Procedure: INTRAOPERATIVE TRANSTHORACIC ECHOCARDIOGRAM;  Surgeon: Orbie Pyo, MD;  Location: [facility];  Service: Open Heart Surgery;  Laterality: N/A;   JOINT REPLACEMENT     RIGHT HEART CATH AND CORONARY ANGIOGRAPHY N/A 11/25/2022   Procedure: RIGHT HEART CATH AND CORONARY ANGIOGRAPHY;  Surgeon: Orbie Pyo, MD;  Location: [facility];  Service: Cardiovascular;  Laterality: N/A;   TOTAL KNEE ARTHROPLASTY Left 08/16/2013   Procedure: TOTAL KNEE ARTHROPLASTY;  Surgeon: Nestor Lewandowsky, MD;  Location: [facility];  Service: Orthopedics;  Laterality: Left;   TOTAL KNEE ARTHROPLASTY Right 09/29/2013   TOTAL KNEE ARTHROPLASTY Right 09/29/2013   Procedure: TOTAL KNEE ARTHROPLASTY;  Surgeon: Nestor Lewandowsky, MD;  Location: [facility];  Service: Orthopedics;  Laterality: Right;   TRANSCATHETER AORTIC VALVE REPLACEMENT, TRANSFEMORAL N/A 02/04/2023   Procedure: Transcatheter Aortic Valve Replacement, Transfemoral;  Surgeon: Orbie Pyo, MD;  Location: [facility];  Service: Open  Heart Surgery;  Laterality: N/A;     (Not in a hospital admission)   Inpatient Medications:   ondansetron (ZOFRAN) IV  4 mg Intravenous Once    Allergies:  Allergies  Allergen Reactions   Phisohex [Hexachlorophene] Rash    Gets blisters on skin     Family History  Problem Relation Age of Onset   Heart disease Father    Hypertension Father    Alcohol abuse Father      Physical Exam: Vitals:   02/17/23 1425 02/17/23 1439 02/17/23 1445 02/17/23 1500  BP: 112/61  (!) 109/41 (!) 112/54  Pulse: 72  65 63  Resp: 14  18 19   Temp:  98.1 F (36.7 C)    TempSrc:  Oral    SpO2: 96%  97% 91%  Weight:      Height:        GEN- NAD, A&O x 3, normal affect HEENT: Normocephalic, atraumatic Lungs- CTAB, Normal effort.  Heart- Regular rate and rhythm, No M/G/R.  GI- Soft, NT, ND.  Extremities- No clubbing, cyanosis, or edema   Radiology/Studies: VAS [place] LOWER EXTREMITY VENOUS (DVT) (ONLY [facility])  Result Date: 02/17/2023  Lower Venous DVT Study Patient Name:  ZARYA LASSEIGNE  Date of Exam:   02/17/2023 Medical Rec #: [id number]          Accession #:    [id number] Date of Birth: [birth year]          Patient Gender: F Patient Age:   30 years Exam Location:  [facility] Procedure:      VAS [place] LOWER EXTREMITY VENOUS (DVT) Referring Phys: Lynden Oxford --------------------------------------------------------------------------------  Indications: Swelling, and Recent TAVR 02/04/23.  Limitations: Body habitus and Pitting edema throughout. Comparison Study: No prior study on file Performing Technologist: Sherren Kerns RVS  Examination Guidelines: A complete evaluation includes B-mode imaging, spectral Doppler, color Doppler, and power Doppler as needed of all accessible portions of each vessel. Bilateral testing is considered an integral part of a complete examination. Limited examinations for reoccurring indications may be performed as noted. The reflux portion of the exam is performed  with the patient in reverse Trendelenburg.  +---------+---------------+---------+-----------+----------+-------------------+ RIGHT    CompressibilityPhasicitySpontaneityPropertiesThrombus Aging      +---------+---------------+---------+-----------+----------+-------------------+ CFV      Full           Yes      Yes                                      +---------+---------------+---------+-----------+----------+-------------------+ SFJ      Full                                                             +---------+---------------+---------+-----------+----------+-------------------+ FV Prox  Full  See table(s) above for measurements and observations. Electronically signed by Sherald Hess MD on 02/17/2023 at 3:21:14 PM.    Final    CT Angio Chest PE W and/or Wo Contrast  Result Date: 02/17/2023 CLINICAL DATA:  Five day history of shortness of breath with exertion associated with dizziness. Status post TAVR 02/04/2023. EXAM: CT ANGIOGRAPHY CHEST WITH CONTRAST TECHNIQUE: Multidetector CT imaging of the chest was performed using the standard protocol during bolus administration of intravenous contrast. Multiplanar CT image reconstructions and MIPs were obtained to evaluate the vascular anatomy. RADIATION DOSE REDUCTION: This exam was performed according to the departmental dose-optimization program which includes automated exposure control, adjustment of the mA and/or kV according to patient size and/or use of iterative reconstruction technique. CONTRAST:  75mL OMNIPAQUE IOHEXOL 350 MG/ML SOLN COMPARISON:  Same day chest radiograph, CTA chest dated 12/02/2022 FINDINGS: Cardiovascular: The study is high quality for the evaluation of pulmonary embolism. There are no filling defects in the central, lobar, segmental or subsegmental pulmonary artery branches to suggest acute pulmonary embolism. Status post aortic valve replacement. Mitral annular calcifications. Multichamber  cardiomegaly. No significant pericardial fluid/thickening. Coronary artery calcifications and aortic atherosclerosis. Mediastinum/Nodes: Imaged thyroid gland without nodules meeting criteria for imaging follow-up by size. Small hiatal hernia. No pathologically enlarged axillary, supraclavicular, mediastinal, or hilar lymph nodes. Lungs/Pleura: The central airways are patent. Mild mosaic attenuation. No focal consolidation. No pneumothorax. No pleural effusion. Upper abdomen: Cholelithiasis. Musculoskeletal: No acute or abnormal lytic or blastic osseous lesions. Multilevel degenerative changes of the thoracic spine. Review of the MIP images confirms the above findings. IMPRESSION: 1. No evidence of pulmonary embolism. 2. Mild mosaic attenuation, which can be seen in the setting of small airways disease. 3. Multichamber cardiomegaly. 4. Aortic Atherosclerosis (ICD10-I70.0). Coronary artery calcifications. Assessment for potential risk factor modification, dietary therapy or pharmacologic therapy may be warranted, if clinically indicated. Electronically Signed   By: Agustin Cree M.D.   On: 02/17/2023 13:54   DG Chest 2 View  Result Date: 02/17/2023 CLINICAL DATA:  Five day history of shortness of breath with exertion EXAM: CHEST - 2 VIEW COMPARISON:  Chest radiograph dated 01/31/2023 FINDINGS: Low lung volumes with bronchovascular crowding. Left mid lung linear opacities. No pleural effusion or pneumothorax. Enlarged cardiomediastinal silhouette status post aortic valve replacement. No acute osseous abnormality. IMPRESSION: Low lung volumes with bronchovascular crowding. Left mid lung linear opacities, likely atelectasis. Electronically Signed   By: Agustin Cree M.D.   On: 02/17/2023 13:43   ECHOCARDIOGRAM COMPLETE  Result Date: 02/05/2023    ECHOCARDIOGRAM REPORT   Patient Name:   Charlene Tapia Date of Exam: 02/05/2023 Medical Rec #:  [id number]         Height:       62.0 in Accession #:    [id number]         Weight:       266.1 lb Date of Birth:  Jan 23, 1951         BSA:          2.158 m Patient Age:    72 years          BP:           132/52 mmHg Patient Gender: F                 HR:           83 bpm. Exam Location:  Inpatient Procedure: 2D Echo, Color Doppler, Cardiac Doppler and Intracardiac  ECHOCARDIOGRAM LIMITED REPORT   Patient Name:   Charlene Tapia Date of Exam: 02/04/2023 Medical Rec #:  [id number]         Height:       62.0 in Accession #:    [id number]        Weight:       269.0 lb Date of Birth:  [birth year]         BSA:          2.168 m Patient Age:    72 years          BP:           164/84 mmHg Patient Gender: F                 HR:           75 bpm. Exam Location:  Inpatient Procedure: Echo Assisted Procedure Indications:     TAVR  History:         Patient has prior history of Echocardiogram examinations, most                  recent 11/20/2022. Aortic Valve Disease; Risk                  Factors:Hypertension and Dyslipidemia.                  Aortic Valve: 29 mm Medtronic stented (TAVR) valve is present                  in the aortic position. Procedure Date: 02/04/23.  Sonographer:     Delcie Roch RDCS Referring Phys:  [id number] Orbie Pyo Diagnosing Phys: Charlton Haws MD IMPRESSIONS  1. Left ventricular ejection fraction, by estimation, is 60 to 65%. The left ventricle has normal function. There is mild left ventricular hypertrophy.  2. Left atrial size was moderately dilated.  3. Right atrial size was mildly dilated.  4. The mitral valve is degenerative. Trivial mitral valve regurgitation. Severe mitral annular calcification.  5. Pre TAVR: tri leaflet AV with severe calcification moderate AR and severe AS with mean gradient 50 peak 92 mmHg AVA 0.6 cm2         Post TAVR: well placed 29 mm supra annular Medtronic Evolut valve mean gradient 11 peak 20 mmHg AVA 2.3 cm2 no significant PVL after wire removed .  The aortic valve has been repaired/replaced. There is a 29 mm Medtronic stented (TAVR) valve present in the aortic position. Procedure Date: 02/04/23. FINDINGS  Left Ventricle: Left ventricular ejection fraction, by estimation, is 60 to 65%. The left ventricle has normal function. The left ventricular internal cavity size was normal in size. There is mild left ventricular hypertrophy. Left Atrium: Left atrial size was moderately dilated. Right Atrium: Right atrial size was mildly dilated. Pericardium: There is no evidence of pericardial effusion. Mitral Valve: The mitral valve is degenerative in appearance. There is severe thickening of the mitral valve leaflet(s). There is severe calcification of the mitral valve leaflet(s). Severe mitral annular calcification. Trivial mitral valve regurgitation. MV peak gradient, 13.7 mmHg. The mean mitral valve gradient is 7.0 mmHg. Tricuspid Valve: Tricuspid valve regurgitation is mild. Aortic Valve: Pre TAVR: tri leaflet AV with severe calcification moderate AR and severe AS with mean gradient 50 peak 92 mmHg AVA 0.6 cm2 Post TAVR: well placed 29 mm supra annular Medtronic Evolut valve mean gradient 11 peak 20 mmHg AVA 2.3 cm2 no significant PVL after wire removed. The  Service: Orthopedics;  Laterality: Left;   TOTAL KNEE ARTHROPLASTY Right 09/29/2013   TOTAL KNEE ARTHROPLASTY Right 09/29/2013   Procedure: TOTAL KNEE ARTHROPLASTY;  Surgeon: Nestor Lewandowsky, MD;  Location: [facility];  Service: Orthopedics;  Laterality: Right;   TRANSCATHETER AORTIC VALVE REPLACEMENT, TRANSFEMORAL N/A 02/04/2023   Procedure: Transcatheter Aortic Valve Replacement, Transfemoral;  Surgeon: Orbie Pyo, MD;  Location: [facility];  Service: Open  Heart Surgery;  Laterality: N/A;     (Not in a hospital admission)   Inpatient Medications:   ondansetron (ZOFRAN) IV  4 mg Intravenous Once    Allergies:  Allergies  Allergen Reactions   Phisohex [Hexachlorophene] Rash    Gets blisters on skin     Family History  Problem Relation Age of Onset   Heart disease Father    Hypertension Father    Alcohol abuse Father      Physical Exam: Vitals:   02/17/23 1425 02/17/23 1439 02/17/23 1445 02/17/23 1500  BP: 112/61  (!) 109/41 (!) 112/54  Pulse: 72  65 63  Resp: 14  18 19   Temp:  98.1 F (36.7 C)    TempSrc:  Oral    SpO2: 96%  97% 91%  Weight:      Height:        GEN- NAD, A&O x 3, normal affect HEENT: Normocephalic, atraumatic Lungs- CTAB, Normal effort.  Heart- Regular rate and rhythm, No M/G/R.  GI- Soft, NT, ND.  Extremities- No clubbing, cyanosis, or edema   Radiology/Studies: VAS [place] LOWER EXTREMITY VENOUS (DVT) (ONLY [facility])  Result Date: 02/17/2023  Lower Venous DVT Study Patient Name:  ZARYA LASSEIGNE  Date of Exam:   02/17/2023 Medical Rec #: [id number]          Accession #:    [id number] Date of Birth: [birth year]          Patient Gender: F Patient Age:   30 years Exam Location:  [facility] Procedure:      VAS [place] LOWER EXTREMITY VENOUS (DVT) Referring Phys: Lynden Oxford --------------------------------------------------------------------------------  Indications: Swelling, and Recent TAVR 02/04/23.  Limitations: Body habitus and Pitting edema throughout. Comparison Study: No prior study on file Performing Technologist: Sherren Kerns RVS  Examination Guidelines: A complete evaluation includes B-mode imaging, spectral Doppler, color Doppler, and power Doppler as needed of all accessible portions of each vessel. Bilateral testing is considered an integral part of a complete examination. Limited examinations for reoccurring indications may be performed as noted. The reflux portion of the exam is performed  with the patient in reverse Trendelenburg.  +---------+---------------+---------+-----------+----------+-------------------+ RIGHT    CompressibilityPhasicitySpontaneityPropertiesThrombus Aging      +---------+---------------+---------+-----------+----------+-------------------+ CFV      Full           Yes      Yes                                      +---------+---------------+---------+-----------+----------+-------------------+ SFJ      Full                                                             +---------+---------------+---------+-----------+----------+-------------------+ FV Prox  Full  ELECTROPHYSIOLOGY H&P NOTE    Patient ID: JAKARI JACOT MRN: [id number], DOB/AGE: [birth year] 72 y.o.  Admit date: 02/17/2023 Date of Consult: 02/17/2023  Primary Physician: Elenore Paddy, NP Primary Cardiologist: Orbie Pyo, MD  Electrophysiologist: New   Referring Provider: Dr. Rush Landmark  Patient Profile: Charlene Tapia is a 72 y.o. female with a history of HTN, morbid obesity (BMI 48), and severe AS with moderate AI s/p TVAR 02/04/2023 who is being seen today for the evaluation of symptomatic bradycardia at the request of Dr. Rush Landmark.  HPI:  Charlene Tapia is a 72 y.o. female with medical history as above.   Ms. Talamante was recenlty noted to have a heart murmur by PCP which prompted an echo. Echo 11/20/22 showed EF 60% and severe mean grad 49 mmHg, AVA 1.02 cm2 (felt to be over-estimated based on an inaccurate LVOT diameter), moderate AI, moderate MS/mild MR. [facility] 11/25/22 showed minimal CAD   She was evaluated by the multidisciplinary valve team and felt to have severe, symptomatic aortic stenosis and to be a suitable candidate. She is now s/p successful TAVR with a 29 mm Evolut FX THV via the TF approach on 02/04/23. Post operative echo with hyperdynamic LV at 70-75% with moderate MS and stable TAVR valve function with a mean gradient at 17.96mmHg, 31.47mmHg, and AVA by VTI 1.32cm2. She was started on ASA 81mg  daily, Toprol 12.5mg , and Lasix 20mg  daily.   Seen in structural office 9/23 and was doing well, but noted to have new LBBB.   Called the office today with worsening lightheadedness and dizziness over the weekend, and several episodes of near syncope. Also with "chest pressure" and feeling like someone is pressing down on her.  Advised to report to ED and shown to be having bradycardia and intermittent second degree HB.   Case discussed with Dr. Lynnette Caffey who recommended pt be sent for temp wire with recent TAVR, and worsening conduction.   Pt currently feeling OK at  rest. Continues to have symptoms associated with her bradycardia. No frank syncope but came "very close" a few times.    Labs Potassium3.8 (09/30 1035) Magnesium  1.9 (09/30 1220) Creatinine, ser  0.98 (09/30 1035) PLT  171 (09/30 1035) HGB  11.1* (09/30 1035) WBC 6.1 (09/30 1035) Troponin I (High Sensitivity)23* (09/30 1220).    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 Lynnette Caffey and Dr. Leafy Ro   Severe aortic stenosis      Surgical History:  Past Surgical History:  Procedure Laterality Date   GANGLION CYST EXCISION Right 01/18/1989   INGUINAL HERNIA REPAIR Bilateral 1954   "age 13 month"   INTRAOPERATIVE TRANSTHORACIC ECHOCARDIOGRAM N/A 02/04/2023   Procedure: INTRAOPERATIVE TRANSTHORACIC ECHOCARDIOGRAM;  Surgeon: Orbie Pyo, MD;  Location: [facility];  Service: Open Heart Surgery;  Laterality: N/A;   JOINT REPLACEMENT     RIGHT HEART CATH AND CORONARY ANGIOGRAPHY N/A 11/25/2022   Procedure: RIGHT HEART CATH AND CORONARY ANGIOGRAPHY;  Surgeon: Orbie Pyo, MD;  Location: [facility];  Service: Cardiovascular;  Laterality: N/A;   TOTAL KNEE ARTHROPLASTY Left 08/16/2013   Procedure: TOTAL KNEE ARTHROPLASTY;  Surgeon: Nestor Lewandowsky, MD;  Location: [facility];  Service: Orthopedics;  Laterality: Left;   TOTAL KNEE ARTHROPLASTY Right 09/29/2013   TOTAL KNEE ARTHROPLASTY Right 09/29/2013   Procedure: TOTAL KNEE ARTHROPLASTY;  Surgeon: Nestor Lewandowsky, MD;  Location: [facility];  Service: Orthopedics;  Laterality: Right;   TRANSCATHETER AORTIC VALVE REPLACEMENT, TRANSFEMORAL N/A 02/04/2023   Procedure: Transcatheter Aortic Valve Replacement, Transfemoral;  Surgeon: Orbie Pyo, MD;  Location: [facility];  Service: Open  Heart Surgery;  Laterality: N/A;     (Not in a hospital admission)   Inpatient Medications:   ondansetron (ZOFRAN) IV  4 mg Intravenous Once    Allergies:  Allergies  Allergen Reactions   Phisohex [Hexachlorophene] Rash    Gets blisters on skin     Family History  Problem Relation Age of Onset   Heart disease Father    Hypertension Father    Alcohol abuse Father      Physical Exam: Vitals:   02/17/23 1425 02/17/23 1439 02/17/23 1445 02/17/23 1500  BP: 112/61  (!) 109/41 (!) 112/54  Pulse: 72  65 63  Resp: 14  18 19   Temp:  98.1 F (36.7 C)    TempSrc:  Oral    SpO2: 96%  97% 91%  Weight:      Height:        GEN- NAD, A&O x 3, normal affect HEENT: Normocephalic, atraumatic Lungs- CTAB, Normal effort.  Heart- Regular rate and rhythm, No M/G/R.  GI- Soft, NT, ND.  Extremities- No clubbing, cyanosis, or edema   Radiology/Studies: VAS [place] LOWER EXTREMITY VENOUS (DVT) (ONLY [facility])  Result Date: 02/17/2023  Lower Venous DVT Study Patient Name:  ZARYA LASSEIGNE  Date of Exam:   02/17/2023 Medical Rec #: [id number]          Accession #:    [id number] Date of Birth: [birth year]          Patient Gender: F Patient Age:   30 years Exam Location:  [facility] Procedure:      VAS [place] LOWER EXTREMITY VENOUS (DVT) Referring Phys: Lynden Oxford --------------------------------------------------------------------------------  Indications: Swelling, and Recent TAVR 02/04/23.  Limitations: Body habitus and Pitting edema throughout. Comparison Study: No prior study on file Performing Technologist: Sherren Kerns RVS  Examination Guidelines: A complete evaluation includes B-mode imaging, spectral Doppler, color Doppler, and power Doppler as needed of all accessible portions of each vessel. Bilateral testing is considered an integral part of a complete examination. Limited examinations for reoccurring indications may be performed as noted. The reflux portion of the exam is performed  with the patient in reverse Trendelenburg.  +---------+---------------+---------+-----------+----------+-------------------+ RIGHT    CompressibilityPhasicitySpontaneityPropertiesThrombus Aging      +---------+---------------+---------+-----------+----------+-------------------+ CFV      Full           Yes      Yes                                      +---------+---------------+---------+-----------+----------+-------------------+ SFJ      Full                                                             +---------+---------------+---------+-----------+----------+-------------------+ FV Prox  Full  ECHOCARDIOGRAM LIMITED REPORT   Patient Name:   Charlene Tapia Date of Exam: 02/04/2023 Medical Rec #:  [id number]         Height:       62.0 in Accession #:    [id number]        Weight:       269.0 lb Date of Birth:  [birth year]         BSA:          2.168 m Patient Age:    72 years          BP:           164/84 mmHg Patient Gender: F                 HR:           75 bpm. Exam Location:  Inpatient Procedure: Echo Assisted Procedure Indications:     TAVR  History:         Patient has prior history of Echocardiogram examinations, most                  recent 11/20/2022. Aortic Valve Disease; Risk                  Factors:Hypertension and Dyslipidemia.                  Aortic Valve: 29 mm Medtronic stented (TAVR) valve is present                  in the aortic position. Procedure Date: 02/04/23.  Sonographer:     Delcie Roch RDCS Referring Phys:  [id number] Orbie Pyo Diagnosing Phys: Charlton Haws MD IMPRESSIONS  1. Left ventricular ejection fraction, by estimation, is 60 to 65%. The left ventricle has normal function. There is mild left ventricular hypertrophy.  2. Left atrial size was moderately dilated.  3. Right atrial size was mildly dilated.  4. The mitral valve is degenerative. Trivial mitral valve regurgitation. Severe mitral annular calcification.  5. Pre TAVR: tri leaflet AV with severe calcification moderate AR and severe AS with mean gradient 50 peak 92 mmHg AVA 0.6 cm2         Post TAVR: well placed 29 mm supra annular Medtronic Evolut valve mean gradient 11 peak 20 mmHg AVA 2.3 cm2 no significant PVL after wire removed .  The aortic valve has been repaired/replaced. There is a 29 mm Medtronic stented (TAVR) valve present in the aortic position. Procedure Date: 02/04/23. FINDINGS  Left Ventricle: Left ventricular ejection fraction, by estimation, is 60 to 65%. The left ventricle has normal function. The left ventricular internal cavity size was normal in size. There is mild left ventricular hypertrophy. Left Atrium: Left atrial size was moderately dilated. Right Atrium: Right atrial size was mildly dilated. Pericardium: There is no evidence of pericardial effusion. Mitral Valve: The mitral valve is degenerative in appearance. There is severe thickening of the mitral valve leaflet(s). There is severe calcification of the mitral valve leaflet(s). Severe mitral annular calcification. Trivial mitral valve regurgitation. MV peak gradient, 13.7 mmHg. The mean mitral valve gradient is 7.0 mmHg. Tricuspid Valve: Tricuspid valve regurgitation is mild. Aortic Valve: Pre TAVR: tri leaflet AV with severe calcification moderate AR and severe AS with mean gradient 50 peak 92 mmHg AVA 0.6 cm2 Post TAVR: well placed 29 mm supra annular Medtronic Evolut valve mean gradient 11 peak 20 mmHg AVA 2.3 cm2 no significant PVL after wire removed. The

## 2023-02-17 NOTE — ED Notes (Signed)
Pt attached to cardiac pads and remains on 12 lead monitor. Pt awaiting to go to cath lab.

## 2023-02-17 NOTE — ED Provider Notes (Cosign Needed Addendum)
// [facility] AT [facility] Provider Note   CSN: [id number] Arrival date & time: 02/17/23  1023     History  Chief Complaint  Patient presents with   Chest Pain    Charlene Tapia is a 72 y.o. female with past medical history of HLD, obesity, s/p TAVR (02/04/23) presents to emergency department for evaluation of "dizzy spells" that started on Wednesday.  She reports that she gets dizzy, lightheaded, sensation of shoulder pressure, and short of breath with exertion.  She reports that the spells did not start until following her TAVR procedure.   Today, she reports that she was walking to the kitchen to call cardiologist when she experienced a dizzy spell to include dizziness, lightheadedness, shortness of breath, shoulder pressure that resolves with rest.  She denies chest pain, fever.   Chest Pain Associated symptoms: dizziness   Associated symptoms: no abdominal pain, no fever and no shortness of breath       Home Medications Prior to Admission medications   Medication Sig Start Date End Date Taking? Authorizing Provider  amLODipine (NORVASC) 5 MG tablet Take 1 tablet (5 mg total) by mouth daily. Patient taking differently: Take 5 mg by mouth at bedtime. 11/01/22   Elenore Paddy, NP  amoxicillin (AMOXIL) 500 MG capsule Take 4 capsules by mouth 1 hour prior to dental procedures and cleanings 02/10/23   Filbert Schilder, NP  aspirin EC 81 MG tablet Take 1 tablet (81 mg total) by mouth daily. Swallow whole. 02/05/23   Janetta Hora, PA-C  Clobetasol Prop Emollient Base (CLOBETASOL PROPIONATE E) 0.05 % emollient cream Apply to affected area 2 (two) times daily. Patient taking differently: Apply 1 Application topically 2 (two) times daily as needed (psoriasis). 10/18/22   Elenore Paddy, NP  fluticasone Aleda Grana) 50 MCG/ACT nasal spray Place 2 sprays into both nostrils daily as needed for allergies or rhinitis.    [provider]  furosemide  (LASIX) 20 MG tablet Take 1 tablet (20 mg total) by mouth daily. 02/05/23   Janetta Hora, PA-C  ibuprofen (ADVIL) 200 MG tablet Take 400 mg by mouth every 8 (eight) hours as needed for mild pain or moderate pain.    [provider]  metoprolol succinate (TOPROL XL) 25 MG 24 hr tablet Take 0.5 tablets (12.5 mg total) by mouth at bedtime. 02/05/23 02/05/24  Janetta Hora, PA-C  Multiple Vitamins-Minerals (MULTIVITAMIN GUMMIES WOMENS PO) Take 2 each by mouth daily.    [provider]      Allergies    Phisohex [hexachlorophene]    Review of Systems   Review of Systems  Constitutional:  Negative for fever.  Respiratory:  Negative for choking, chest tightness and shortness of breath.   Cardiovascular:  Positive for chest pain.  Gastrointestinal:  Negative for abdominal pain.  Neurological:  Positive for dizziness and light-headedness.    Physical Exam Updated Vital Signs BP 95/73   Pulse (!) 25   Temp 99 F (37.2 C) (Oral)   Resp 16   Ht 5' 2.5" (1.588 m)   Wt 120.4 kg   SpO2 92%   BMI 47.77 kg/m  Physical Exam Vitals and nursing note reviewed.  Constitutional:      General: She is not in acute distress.    Appearance: Normal appearance. She is not diaphoretic.  HENT:     Head: Normocephalic and atraumatic.  Eyes:     Conjunctiva/sclera: Conjunctivae normal.  Cardiovascular:  Rate and Rhythm: Rhythm irregular.     Comments: Irregular rhythm with occasional sinus pauses causing intermittent bradycardia 2+ dorsalis pedis bilaterally Pulmonary:     Effort: Pulmonary effort is normal. No respiratory distress.     Breath sounds: No wheezing.  Chest:     Chest wall: No tenderness.  Abdominal:     General: There is no distension.     Palpations: Abdomen is soft.     Tenderness: There is no abdominal tenderness.  Skin:    General: Skin is warm.     Coloration: Skin is not jaundiced or pale.     Comments: Ecchymosis of right thigh from TAVR RLE  2+ edema, warmth, erythema LLE without edema, warmth, erythema  Neurological:     Mental Status: She is alert. Mental status is at baseline.     ED Results / Procedures / Treatments   Labs (all labs ordered are listed, but only abnormal results are displayed) Labs Reviewed  BASIC METABOLIC PANEL - Abnormal; Notable for the following components:      Result Value   Glucose, Bld 124 (*)    All other components within normal limits  CBC - Abnormal; Notable for the following components:   RBC 3.86 (*)    Hemoglobin 11.1 (*)    HCT 35.1 (*)    All other components within normal limits  TROPONIN I (HIGH SENSITIVITY) - Abnormal; Notable for the following components:   Troponin I (High Sensitivity) 27 (*)    All other components within normal limits  RESP PANEL BY RT-PCR (RSV, FLU A&B, COVID)  RVPGX2  BRAIN NATRIURETIC PEPTIDE  TSH  MAGNESIUM  TROPONIN I (HIGH SENSITIVITY)    EKG None  Radiology No results found.  Procedures .Critical Care  Performed by: Judithann Sheen, PA Authorized by: Judithann Sheen, PA   Critical care provider statement:    Critical care time (minutes):  45   Critical care was necessary to treat or prevent imminent or life-threatening deterioration of the following conditions:  Cardiac failure   Critical care was time spent personally by me on the following activities:  Development of treatment plan with patient or surrogate, discussions with consultants, evaluation of patient's response to treatment, examination of patient, ordering and review of laboratory studies, ordering and review of radiographic studies, ordering and performing treatments and interventions, pulse oximetry, re-evaluation of patient's condition and review of old charts     Medications Ordered in ED Medications  ondansetron (ZOFRAN) injection 4 mg (0 mg Intravenous Hold 02/17/23 1232)  iohexol (OMNIPAQUE) 350 MG/ML injection 75 mL (75 mLs Intravenous Contrast Given 02/17/23 1246)     ED Course/ Medical Decision Making/ A&P Clinical Course as of 02/17/23 1405  Mon Feb 17, 2023  1241 Pulse Rate(!): 25 Acknowledged. Irregular rhythm with sinus pauses [LB]  1404 Troponin I (High Sensitivity)(!): 23 [LB]    Clinical Course User Index [LB] Judithann Sheen, PA                             Geneva (Revised) Score: 10, Geneva Score Interpretation: Moderate Risk Group: ~20-30% incidence of pulmonary embolism from several studies PERC Score: 3, PERC Score Interpretation: If any criteria are positive, the PERC rule cannot be used to rule out PE in this patient Medical Decision Making Amount and/or Complexity of Data Reviewed Labs: ordered. Decision-making details documented in ED Course. Radiology: ordered.  Risk Prescription drug management. Decision regarding  hospitalization.   On evaluation, patient is resting comfortably in bed.  She has no complaints of shortness of breath, chest pain.  See HPI.  Patient presents to the ED for concern of "dizzy spells" that have been occurring since Wednesday, this involves an extensive number of treatment options, and is a complaint that carries with it a high risk of complications and morbidity.  The differential diagnosis includes ACS, arrhythmia, electrolyte abnormalities, DVT, PE   Co morbidities that complicate the patient evaluation  S/P TAVR   Additional history obtained:  Additional history obtained from Family, Nursing, Outside Medical Records, and Past Admission   External records from outside source obtained upon chart review   Lab Tests:  I Ordered, and personally interpreted labs.  The pertinent results include: Elevated troponin at 27   Imaging Studies ordered:  I ordered imaging studies including CT PE and U/S of right LE I independently visualized and interpreted CT PE which was significant for Multichamber cardiomegaly Negative for PE I independently visualized and interpreted U/S of right LE  significant for Negative DVT I agree with the radiologist interpretation   Cardiac Monitoring:  The patient was maintained on a cardiac monitor.  I personally viewed and interpreted the cardiac monitored which showed an underlying rhythm of: NSR with LBBB and sinus pauses.  Rate is irregular ranging from 42-75bpm   Medicines ordered and prescription drug management:  I ordered medication including zofran  for nausea  Reevaluation of the patient after these medicines showed that the patient improved I have reviewed the patients home medicines and have made adjustments as needed   Consultations Obtained:  Dr. Rush Landmark requested consultation with cardiology,  and discussed lab and imaging findings as well as pertinent plan - they recommend: Admission to cardiac ICU   Problem List / ED Course:  Dizziness Irregular cardiac rhythm   Reevaluation:  After the interventions noted above, I reevaluated the patient and found that they have :stayed the same   Dispostion:  Upon evaluation, patient is resting comfortably in bed.  She has no complaints of chest pain, shortness of breath, dizziness while at rest.  See HPI.  Patient is an irregular rhythm ranging from 36-75bpm with sinus pauses.  Patient has no complaints of dizziness, shortness of breath, or chest pain at rest.  Lab work is significant for flat/downtrending troponin (27 down to 23), elevated BNP (400), and mild hyperglycemia (124).  TSH, magnesium, electrolytes WNL.  CTPA negative for PE, ultrasound of right LE is negative for DVT. EKG shows NSR with LBBB with sinus pauses.Zofran given for nausea.  After consideration of the diagnostic results and the patients response to treatment, I feel that the patent would benefit from cardiology consult and admission for evaluation of new irregular bradycardic heart rate.   12:48 PM Cardiology consulted   They recommend admission to Cardiac ICU and will admit patient for management of  irregular rhythm with frequent sinus pauses and bradycardia.  Dr Tegeler individually assessed patient, consulted cardiology, and agrees with treatment plan.        Final Clinical Impression(s) / ED Diagnoses Final diagnoses:  Irregular cardiac rhythm  Dizziness    Rx / DC Orders ED Discharge Orders     None         Judithann Sheen, PA 02/17/23 1556    Judithann Sheen, PA 02/17/23 1600    Tegeler, Canary Brim, MD 02/18/23 1521

## 2023-02-17 NOTE — ED Triage Notes (Signed)
Pt states, "it feels like someone is sitting on my shoulders and I get a warm feeling in head and it goes through entire body and it feels like I'm going ot pass out, dizziness, nausea, SOB w/exertionx5d.

## 2023-02-18 ENCOUNTER — Encounter (HOSPITAL_COMMUNITY): Payer: Self-pay | Admitting: Internal Medicine

## 2023-02-18 ENCOUNTER — Inpatient Hospital Stay (HOSPITAL_COMMUNITY)
Admission: EM | Disposition: A | Discharge status: Home / Self Care | Payer: Self-pay | Source: Home / Self Care | Attending: Cardiology

## 2023-02-18 ENCOUNTER — Other Ambulatory Visit: Payer: Self-pay

## 2023-02-18 DIAGNOSIS — I441 Atrioventricular block, second degree: Secondary | ICD-10-CM | POA: Diagnosis not present

## 2023-02-18 HISTORY — PX: PACEMAKER IMPLANT: EP1218

## 2023-02-18 LAB — CBC
HCT: 38.5 % (ref 36.0–46.0)
Hemoglobin: 12.2 g/dL (ref 12.0–15.0)
MCH: 29.3 pg (ref 26.0–34.0)
MCHC: 31.7 g/dL (ref 30.0–36.0)
MCV: 92.3 fL (ref 80.0–100.0)
Platelets: 159 10*3/uL (ref 150–400)
RBC: 4.17 MIL/uL (ref 3.87–5.11)
RDW: 13.4 % (ref 11.5–15.5)
WBC: 6.3 10*3/uL (ref 4.0–10.5)
nRBC: 0 % (ref 0.0–0.2)

## 2023-02-18 LAB — BASIC METABOLIC PANEL
Anion gap: 13 (ref 5–15)
BUN: 16 mg/dL (ref 8–23)
CO2: 22 mmol/L (ref 22–32)
Calcium: 8.7 mg/dL — ABNORMAL LOW (ref 8.9–10.3)
Chloride: 105 mmol/L (ref 98–111)
Creatinine, Ser: 0.77 mg/dL (ref 0.44–1.00)
GFR, Estimated: >60 mL/min (ref >60)
Glucose, Bld: 105 mg/dL — ABNORMAL HIGH (ref 70–99)
Potassium: 4.1 mmol/L (ref 3.5–5.1)
Sodium: 140 mmol/L (ref 135–145)

## 2023-02-18 LAB — HIV ANTIBODY (ROUTINE TESTING W REFLEX): HIV Screen 4th Generation wRfx: NONREACTIVE

## 2023-02-18 LAB — SURGICAL PCR SCREEN
MRSA, PCR: NEGATIVE
Staphylococcus aureus: NEGATIVE

## 2023-02-18 SURGERY — PACEMAKER IMPLANT

## 2023-02-18 MED ORDER — FENTANYL CITRATE (PF) 100 MCG/2ML IJ SOLN
INTRAMUSCULAR | Status: DC | PRN
  Administered 2023-02-18 (×3): 25 ug via INTRAVENOUS

## 2023-02-18 MED ORDER — CEFAZOLIN SODIUM-DEXTROSE 1-4 GM/50ML-% IV SOLN
1.0000 g | INTRAVENOUS | Status: AC
  Administered 2023-02-18: 1 g via INTRAVENOUS
  Filled 2023-02-18: qty 50

## 2023-02-18 MED ORDER — SODIUM CHLORIDE 0.9 % IV SOLN: INTRAVENOUS | Status: DC

## 2023-02-18 MED ORDER — LIDOCAINE HCL (PF) 1 % IJ SOLN
INTRAMUSCULAR | Status: DC | PRN
  Administered 2023-02-18: 60 mL

## 2023-02-18 MED ORDER — CEFAZOLIN SODIUM-DEXTROSE 2-4 GM/100ML-% IV SOLN
INTRAVENOUS | Status: AC
  Filled 2023-02-18: qty 100

## 2023-02-18 MED ORDER — MIDAZOLAM HCL 5 MG/5ML IJ SOLN
INTRAMUSCULAR | Status: DC | PRN
  Administered 2023-02-18: 1 mg via INTRAVENOUS
  Administered 2023-02-18 (×2): .5 mg via INTRAVENOUS

## 2023-02-18 MED ORDER — MIDAZOLAM HCL 2 MG/2ML IJ SOLN
INTRAMUSCULAR | Status: AC
  Filled 2023-02-18: qty 2

## 2023-02-18 MED ORDER — METOPROLOL SUCCINATE ER 25 MG PO TB24
12.5000 mg | ORAL_TABLET | Freq: Every day | ORAL | Status: DC
  Administered 2023-02-18: 12.5 mg via ORAL
  Filled 2023-02-18: qty 1

## 2023-02-18 MED ORDER — SODIUM CHLORIDE 0.9 % IV SOLN
INTRAVENOUS | Status: AC | PRN
  Administered 2023-02-18: 100 mL via INTRAVENOUS

## 2023-02-18 MED ORDER — CEFAZOLIN SODIUM-DEXTROSE 2-4 GM/100ML-% IV SOLN
2.0000 g | Freq: Four times a day (QID) | INTRAVENOUS | Status: AC
  Administered 2023-02-18 – 2023-02-19 (×3): 2 g via INTRAVENOUS
  Filled 2023-02-18 (×3): qty 100

## 2023-02-18 MED ORDER — CEFAZOLIN SODIUM-DEXTROSE 2-4 GM/100ML-% IV SOLN: 2.0000 g | INTRAVENOUS | Status: DC

## 2023-02-18 MED ORDER — CEFAZOLIN SODIUM-DEXTROSE 2-4 GM/100ML-% IV SOLN
2.0000 g | INTRAVENOUS | Status: AC
  Administered 2023-02-18: 2 g via INTRAVENOUS

## 2023-02-18 MED ORDER — SODIUM CHLORIDE 0.9 % IV SOLN
80.0000 mg | INTRAVENOUS | Status: AC
  Filled 2023-02-18: qty 2

## 2023-02-18 MED ORDER — CHLORHEXIDINE GLUCONATE 4 % EX SOLN: 60.0000 mL | Freq: Once | CUTANEOUS | Status: AC

## 2023-02-18 MED ORDER — ONDANSETRON HCL 4 MG/2ML IJ SOLN
4.0000 mg | Freq: Three times a day (TID) | INTRAMUSCULAR | Status: DC | PRN
  Filled 2023-02-18: qty 2

## 2023-02-18 MED ORDER — CHLORHEXIDINE GLUCONATE 4 % EX SOLN
60.0000 mL | Freq: Once | CUTANEOUS | Status: AC
  Administered 2023-02-18: 4 via TOPICAL

## 2023-02-18 MED ORDER — ONDANSETRON HCL 4 MG/2ML IJ SOLN
4.0000 mg | Freq: Four times a day (QID) | INTRAMUSCULAR | Status: DC | PRN
  Administered 2023-02-18: 4 mg via INTRAVENOUS

## 2023-02-18 MED ORDER — CEFAZOLIN IN SODIUM CHLORIDE 3-0.9 GM/100ML-% IV SOLN
3.0000 g | INTRAVENOUS | Status: DC
  Filled 2023-02-18 (×2): qty 100

## 2023-02-18 MED ORDER — ACETAMINOPHEN 325 MG PO TABS: 325.0000 mg | ORAL_TABLET | ORAL | Status: DC | PRN

## 2023-02-18 MED ORDER — SODIUM CHLORIDE 0.9 % IV SOLN: 80.0000 mg | INTRAVENOUS | Status: DC

## 2023-02-18 MED ORDER — SODIUM CHLORIDE 0.9 % IV SOLN
INTRAVENOUS | Status: AC
  Administered 2023-02-18: 80 mg
  Filled 2023-02-18: qty 2

## 2023-02-18 MED ORDER — HEPARIN (PORCINE) IN NACL 1000-0.9 UT/500ML-% IV SOLN
INTRAVENOUS | Status: DC | PRN
  Administered 2023-02-18: 500 mL

## 2023-02-18 MED ORDER — LIDOCAINE HCL (PF) 1 % IJ SOLN
INTRAMUSCULAR | Status: AC
  Filled 2023-02-18: qty 60

## 2023-02-18 MED ORDER — FENTANYL CITRATE (PF) 100 MCG/2ML IJ SOLN
INTRAMUSCULAR | Status: AC
  Filled 2023-02-18: qty 2

## 2023-02-18 SURGICAL SUPPLY — 15 items
CABLE SURGICAL S-101-97-12 (CABLE) ×1 IMPLANT
CATH CPS LOCATOR 3D MED (CATHETERS) IMPLANT
HELIX LOCKING TOOL (MISCELLANEOUS) ×1
LEAD ULTIPACE 52 LPA1231/52 (Lead) IMPLANT
LEAD ULTIPACE 65 LPA1231/65 (Lead) IMPLANT
MAT PREVALON FULL STRYKER (MISCELLANEOUS) IMPLANT
PACEMAKER ASSURITY DR-RF (Pacemaker) IMPLANT
PAD DEFIB RADIO PHYSIO CONN (PAD) ×1 IMPLANT
SHEATH 7FR PRELUDE SNAP 13 (SHEATH) IMPLANT
SHEATH 9FR PRELUDE SNAP 13 (SHEATH) IMPLANT
SHEATH PROBE COVER 6X72 (BAG) IMPLANT
SLITTER AGILIS HISPRO (INSTRUMENTS) IMPLANT
TOOL HELIX LOCKING (MISCELLANEOUS) IMPLANT
TRAY PACEMAKER INSERTION (PACKS) ×1 IMPLANT
WIRE HI TORQ VERSACORE-J 145CM (WIRE) IMPLANT

## 2023-02-18 NOTE — Plan of Care (Signed)
  Problem: Education: Goal: Understanding of CV disease, CV risk reduction, and recovery process will improve Outcome: Progressing   Problem: Activity: Goal: Ability to return to baseline activity level will improve Outcome: Progressing   Problem: Cardiovascular: Goal: Ability to achieve and maintain adequate cardiovascular perfusion will improve Outcome: Progressing Goal: Vascular access site(s) Level 0-1 will be maintained Outcome: Progressing   Problem: Health Behavior/Discharge Planning: Goal: Ability to safely manage health-related needs after discharge will improve Outcome: Progressing   Problem: Health Behavior/Discharge Planning: Goal: Ability to manage health-related needs will improve Outcome: Progressing   Problem: Clinical Measurements: Goal: Ability to maintain clinical measurements within normal limits will improve Outcome: Progressing Goal: Will remain free from infection Outcome: Progressing Goal: Diagnostic test results will improve Outcome: Progressing Goal: Respiratory complications will improve Outcome: Progressing Goal: Cardiovascular complication will be avoided Outcome: Progressing   Problem: Activity: Goal: Risk for activity intolerance will decrease Outcome: Progressing   Problem: Coping: Goal: Level of anxiety will decrease Outcome: Progressing   Problem: Pain Managment: Goal: General experience of comfort will improve Outcome: Progressing

## 2023-02-18 NOTE — Discharge Instructions (Signed)
After Your Pacemaker   You have a Abbott Pacemaker  ACTIVITY Do not lift your arm above shoulder height for 1 week after your procedure. After 7 days, you may progress as below.  You should remove your sling 24 hours after your procedure, unless otherwise instructed by your provider.     Tuesday February 25, 2023  Wednesday February 26, 2023 Thursday February 27, 2023 Friday February 28, 2023   Do not lift, push, pull, or carry anything over 10 pounds with the affected arm until 6 weeks (Tuesday April 01, 2023 ) after your procedure.   You may drive AFTER your wound check, unless you have been told otherwise by your provider.   Ask your healthcare provider when you can go back to work   INCISION/Dressing If you are on a blood thinner such as Coumadin, Xarelto, Eliquis, Plavix, or Pradaxa please confirm with your provider when this should be resumed.   If large square, outer bandage is left in place, this can be removed after 24 hours from your procedure. Do not remove steri-strips or glue as below.   If a PRESSURE DRESSING (a bulky dressing that usually goes up over your shoulder) was applied or left in place, please follow instructions given by your provider on when to return to have this removed.   Monitor your Pacemaker site for redness, swelling, and drainage. Call the device clinic at [phone] if you experience these symptoms or fever/chills.  If your incision is sealed with Steri-strips or staples, you may shower 7 days after your procedure or when told by your provider. Do not remove the steri-strips or let the shower hit directly on your site. You may wash around your site with soap and water.    If you were discharged in a sling, please do not wear this during the day more than 48 hours after your surgery unless otherwise instructed. This may increase the risk of stiffness and soreness in your shoulder.   Avoid lotions, ointments, or perfumes over your incision until it is  well-healed.  You may use a hot tub or a pool AFTER your wound check appointment if the incision is completely closed.  Pacemaker Alerts:  Some alerts are vibratory and others beep. These are NOT emergencies. Please call our office to let us know. If this occurs at night or on weekends, it can wait until the next business day. Send a remote transmission.  If your device is capable of reading fluid status (for heart failure), you will be offered monthly monitoring to review this with you.   DEVICE MANAGEMENT Remote monitoring is used to monitor your pacemaker from home. This monitoring is scheduled every 91 days by our office. It allows [place] to keep an eye on the functioning of your device to ensure it is working properly. You will routinely see your Electrophysiologist annually (more often if necessary).   You should receive your ID card for your new device in 4-8 weeks. Keep this card with you at all times once received. Consider wearing a medical alert bracelet or necklace.  Your Pacemaker may be MRI compatible. This will be discussed at your next office visit/wound check.  You should avoid contact with strong electric or magnetic fields.   Do not use amateur (ham) radio equipment or electric (arc) welding torches. MP3 player headphones with magnets should not be used. Some devices are safe to use if held at least 12 inches (30 cm) from your Pacemaker. These include power tools, lawn  mowers, and speakers. If you are unsure if something is safe to use, ask your health care provider.  When using your cell phone, hold it to the ear that is on the opposite side from the Pacemaker. Do not leave your cell phone in a pocket over the Pacemaker.  You may safely use electric blankets, heating pads, computers, and microwave ovens.  Call the office right away if: You have chest pain. You feel more short of breath than you have felt before. You feel more light-headed than you have felt before. Your  incision starts to open up.  This information is not intended to replace advice given to you by your health care provider. Make sure you discuss any questions you have with your health care provider.

## 2023-02-18 NOTE — H&P (View-Only) (Signed)
Patient Name: Charlene Tapia Date of Encounter: 02/18/2023  Primary Cardiologist: Orbie Pyo, MD Electrophysiologist: Dr. Jimmey Ralph, new consult  Interval Summary   The patient is doing well today - states she is highly anxious in terms of events surrounding admission.  At this time, the patient denies chest pain, shortness of breath, or any new concerns.  Vital Signs    Vitals:   02/18/23 0339 02/18/23 0400 02/18/23 0500 02/18/23 0600  BP:  128/61 116/62 (!) 133/55  Pulse:      Resp:  19 15 (!) 21  Temp: 97.7 F (36.5 C)     TempSrc: Oral     SpO2:  96% 94% 93%  Weight:    119.9 kg  Height:        Intake/Output Summary (Last 24 hours) at 02/18/2023 0801 Last data filed at 02/18/2023 0600 Gross per 24 hour  Intake 275.57 ml  Output 300 ml  Net -24.43 ml   Filed Weights   02/17/23 1032 02/18/23 0600  Weight: 120.4 kg 119.9 kg    Physical Exam    GEN- The patient is well appearing, alert and oriented x 3 today.   Lungs- Clear to ausculation bilaterally, normal work of breathing Cardiac- Regular / VP, rate and rhythm, no murmurs, rubs or gallops GI- soft, NT, ND, + BS Extremities- no clubbing or cyanosis. No edema  Telemetry    VP 84 (increased per Dr. Jimmey Ralph), intermittent NSVT prior to rate increase (personally reviewed)  Hospital Course    [name] is a 72 y.o. female admitted for symptomatic bradycardia. The patient has a history of TAVR on February 04, 2023 (evolute). The procedure was complicated with a left bundle branch block. Her medical history also includes hypertension, morbid obesity. She has a normal left ventricular function. She initially did well after leaving the hospital. Over the weekend she is experienced lightheadedness and dizziness. These episodes seem to be worsening. She has had several episodes of near syncope. She denies any actual syncopal episode.   Assessment & Plan    Symptomatic Bradycardia  Second Degree AV Block   LBBB, new s/p TAVR  S/p temp pacer 9/30 -increased VP to 84 in setting of NSVT episodes -explained risks, benefits, and alternatives to PPM implantation, including but not limited to bleeding, infection, pneumothorax, pericardial effusion, lead dislodgement, heart attack, stroke, or death.  Pt verbalized understanding and agrees to proceed.  Chest Pressure Negative CTA chest for PE, neg LE doppler  -suspect in setting of bradycardia   NSVT  Predates brady  -tele monitoring  -follow electrolytes closely   S/P TAVR  -stable otherwise    Dr. Jimmey Ralph has evaluated this patient and plans for PPM insertion at next available time on 02/18/23.    For questions or updates, please contact [facility] Please consult [email or web address] for contact info under Cardiology/STEMI.  Signed, Canary Brim, MSN, APRN, NP-C, AGACNP-BC [facility] - Electrophysiology  02/18/2023, 8:01 AM   I have seen, examined the patient, and reviewed the above assessment and plan.    Subjective/Interval: Temp pacer placed yesterday. No acute overnight events. Still intermittently pacing this morning with baseline LBBB and high grade AV block.   GEN: No acute distress but anxious. Cardiac: Bradycardic, irregular rhythm.  Psych: Normal affect   Assessment: #Symptomatic bradycardia #Mobitz 2 AV block #LBBB The patient has developed symptomatic bradycardia, LBBB and new high grade AV block in the setting of a recent TAVR. Temporary pacemaker was placed 02/17/23. We  will proceed to permanent pacemaker implant today in the EP lab. She has a normal LVEF so we will place DDD PPM with LBBAP lead.   Explained risks, benefits, and alternatives to pacemaker implantation, including but not limited to bleeding, infection, damage to heart or lungs, heart attack, stroke, or death.  Pt verbalized understanding and agrees to proceed.    [name], MD 02/18/2023 10:42 AM

## 2023-02-18 NOTE — Discharge Summary (Signed)
ELECTROPHYSIOLOGY PROCEDURE DISCHARGE SUMMARY    Patient ID: Charlene Tapia,  MRN: [id number], DOB/AGE: 1950-10-24 72 y.o.  Admit date: 02/17/2023 Discharge date: 02/19/2023  Primary Care Physician: Elenore Paddy, NP  Primary Cardiologist: Orbie Pyo, MD  Electrophysiologist: Dr. Jimmey Ralph   Primary Discharge Diagnosis:  Symptomatic bradycardia status post pacemaker implantation this admission  Secondary Discharge Diagnosis:  Second Degree AV Block  LBBB, new s/p TAVR  Chest Pressure  NSVT    Allergies  Allergen Reactions   Phisohex [Hexachlorophene] Rash    Blistering rash     Procedures This Admission:  1.  Implantation of a Abbott Dual Chamber PPM on 02/18/2023 by Dr. Jimmey Ralph. The patient received a Abbott Assurity [id number] with a Abbott Ultipace 1231-52 right atrial lead and a Abbott Ultipace 1231-65 right ventricular lead.  There were no immediate post procedure complications.   2.  CXR on  02/19/23  demonstrated no pneumothorax status post device implantation.  Leads in good position.       Brief HPI: Charlene Tapia is a 72 y.o. female was admitted for symptomatic bradycardia and electrophysiology team asked to see for consideration of PPM implantation.  Past medical history includes HTN, morbid obesity, LBBB, AV stenosis s/p TAVR 02/04/23.   The patient has had symptomatic bradycardia without reversible causes identified.  Risks, benefits, and alternatives to PPM implantation were reviewed with the patient who wished to proceed.   Hospital Course:  The patient was admitted and underwent implantation of a Abbott dual chamber PPM with details as outlined above.  She was monitored on telemetry overnight which demonstrated appropriate pacing.  Left chest was without hematoma or ecchymosis.  The device was interrogated and found to be functioning normally.  CXR was obtained and demonstrated no pneumothorax status post device implantation.  Wound care, arm  mobility, and restrictions were reviewed with the patient.  The patient was examined and considered stable for discharge to home.      Physical Exam: Vitals:   02/18/23 2200 02/18/23 2206 02/19/23 0325 02/19/23 0827  BP: (!) 125/113 131/74 139/79 125/63  Pulse:  76 72 67  Resp: 19 16 16 18   Temp:  97.8 F (36.6 C) 98.3 F (36.8 C) 98.5 F (36.9 C)  TempSrc:  Oral Oral Oral  SpO2: 100% 100% 95% 96%  Weight:      Height:        GEN- NAD. A&O x3  HEENT: Normocephalic, atraumatic Lungs- CTAB, Normal effort.  Heart- RRR, No M/G/R.  GI- Soft, NT, ND.  Extremities- No clubbing, cyanosis, or edema;  Skin- warm and dry, no rash or lesion, left chest without hematoma/ecchymosis  Discharge Medications:  Allergies as of 02/19/2023       Reactions   Phisohex [hexachlorophene] Rash   Blistering rash        Medication List     TAKE these medications    amLODipine 5 MG tablet Commonly known as: NORVASC Take 1 tablet (5 mg total) by mouth daily. What changed: when to take this   amoxicillin 500 MG capsule Commonly known as: AMOXIL Take 4 capsules by mouth 1 hour prior to dental procedures and cleanings   aspirin EC 81 MG tablet Take 1 tablet (81 mg total) by mouth daily. Swallow whole.   Clobetasol Propionate E 0.05 % emollient cream Generic drug: Clobetasol Prop Emollient Base Apply to affected area 2 (two) times daily. What changed:  when to take this reasons to take this  fluticasone 50 MCG/ACT nasal spray Commonly known as: FLONASE Place 2 sprays into both nostrils daily as needed for allergies or rhinitis.   furosemide 20 MG tablet Commonly known as: Lasix Take 1 tablet (20 mg total) by mouth daily.   ibuprofen 200 MG tablet Commonly known as: ADVIL Take 400 mg by mouth every 8 (eight) hours as needed for mild pain or moderate pain.   metoprolol succinate 25 MG 24 hr tablet Commonly known as: Toprol XL Take 0.5 tablets (12.5 mg total) by mouth at  bedtime.   MULTIVITAMIN GUMMIES WOMENS PO Take 2 each by mouth daily after supper.               Discharge Care Instructions  (From admission, onward)           Start     Ordered   02/19/23 0000  Discharge wound care:       Comments: See printed discharge instructions for wound care.   02/19/23 1057            Disposition:  Home with usual follow up as in AVS   Duration of Discharge Encounter: Greater than 30 minutes including physician time.  Signed, Canary Brim, MSN, APRN, NP-C, AGACNP-BC [facility] - Electrophysiology  02/19/2023, 10:57 AM

## 2023-02-18 NOTE — Interval H&P Note (Signed)
History and Physical Interval Note:  02/18/2023 10:47 AM  Charlene Tapia  has presented today for surgery, with the diagnosis of heart block.  The various methods of treatment have been discussed with the patient and family. After consideration of risks, benefits and other options for treatment, the patient has consented to  Procedure(s): PACEMAKER IMPLANT (N/A) as a surgical intervention.  The patient's history has been reviewed, patient examined, no change in status, stable for surgery.  I have reviewed the patient's chart and labs.  Questions were answered to the patient's satisfaction.     Charlene Tapia

## 2023-02-18 NOTE — Progress Notes (Addendum)
Patient Name: Charlene Tapia Date of Encounter: 02/18/2023  Primary Cardiologist: Orbie Pyo, MD Electrophysiologist: Dr. Jimmey Ralph, new consult  Interval Summary   The patient is doing well today - states she is highly anxious in terms of events surrounding admission.  At this time, the patient denies chest pain, shortness of breath, or any new concerns.  Vital Signs    Vitals:   02/18/23 0339 02/18/23 0400 02/18/23 0500 02/18/23 0600  BP:  128/61 116/62 (!) 133/55  Pulse:      Resp:  19 15 (!) 21  Temp: 97.7 F (36.5 C)     TempSrc: Oral     SpO2:  96% 94% 93%  Weight:    119.9 kg  Height:        Intake/Output Summary (Last 24 hours) at 02/18/2023 0801 Last data filed at 02/18/2023 0600 Gross per 24 hour  Intake 275.57 ml  Output 300 ml  Net -24.43 ml   Filed Weights   02/17/23 1032 02/18/23 0600  Weight: 120.4 kg 119.9 kg    Physical Exam    GEN- The patient is well appearing, alert and oriented x 3 today.   Lungs- Clear to ausculation bilaterally, normal work of breathing Cardiac- Regular / VP, rate and rhythm, no murmurs, rubs or gallops GI- soft, NT, ND, + BS Extremities- no clubbing or cyanosis. No edema  Telemetry    VP 84 (increased per Dr. Jimmey Ralph), intermittent NSVT prior to rate increase (personally reviewed)  Hospital Course    Charlene Tapia is a 72 y.o. female admitted for symptomatic bradycardia. The patient has a history of TAVR on February 04, 2023 (evolute). The procedure was complicated with a left bundle branch block. Her medical history also includes hypertension, morbid obesity. She has a normal left ventricular function. She initially did well after leaving the hospital. Over the weekend she is experienced lightheadedness and dizziness. These episodes seem to be worsening. She has had several episodes of near syncope. She denies any actual syncopal episode.   Assessment & Plan    Symptomatic Bradycardia  Second Degree AV Block   LBBB, new s/p TAVR  S/p temp pacer 9/30 -increased VP to 84 in setting of NSVT episodes -explained risks, benefits, and alternatives to PPM implantation, including but not limited to bleeding, infection, pneumothorax, pericardial effusion, lead dislodgement, heart attack, stroke, or death.  Pt verbalized understanding and agrees to proceed.  Chest Pressure Negative CTA chest for PE, neg LE doppler  -suspect in setting of bradycardia   NSVT  Predates brady  -tele monitoring  -follow electrolytes closely   S/P TAVR  -stable otherwise    Dr. Jimmey Ralph has evaluated this patient and plans for PPM insertion at next available time on 02/18/23.    For questions or updates, please contact [facility] Please consult [email or web address] for contact info under Cardiology/STEMI.  Signed, Canary Brim, MSN, APRN, NP-C, AGACNP-BC [facility] - Electrophysiology  02/18/2023, 8:01 AM   I have seen, examined the patient, and reviewed the above assessment and plan.    Subjective/Interval: Temp pacer placed yesterday. No acute overnight events. Still intermittently pacing this morning with baseline LBBB and high grade AV block.   GEN: No acute distress but anxious. Cardiac: Bradycardic, irregular rhythm.  Psych: Normal affect   Assessment: #Symptomatic bradycardia #Mobitz 2 AV block #LBBB The patient has developed symptomatic bradycardia, LBBB and new high grade AV block in the setting of a recent TAVR. Temporary pacemaker was placed 02/17/23. We  will proceed to permanent pacemaker implant today in the EP lab. She has a normal LVEF so we will place DDD PPM with LBBAP lead.   Explained risks, benefits, and alternatives to pacemaker implantation, including but not limited to bleeding, infection, damage to heart or lungs, heart attack, stroke, or death.  Pt verbalized understanding and agrees to proceed.    [name], MD 02/18/2023 10:42 AM

## 2023-02-19 ENCOUNTER — Other Ambulatory Visit (HOSPITAL_COMMUNITY): Payer: Self-pay

## 2023-02-19 ENCOUNTER — Encounter (HOSPITAL_COMMUNITY): Payer: Self-pay | Admitting: Cardiology

## 2023-02-19 ENCOUNTER — Inpatient Hospital Stay (HOSPITAL_COMMUNITY): Payer: Medicare Other

## 2023-02-19 DIAGNOSIS — I441 Atrioventricular block, second degree: Secondary | ICD-10-CM | POA: Diagnosis not present

## 2023-02-19 MED FILL — Midazolam HCl Inj 2 MG/2ML (Base Equivalent): INTRAMUSCULAR | Qty: 2 | Status: AC

## 2023-02-19 NOTE — Progress Notes (Signed)
Patient verbalized understanding of dc instructions. [facility] will be here between 3-4 pm to take patient home. DC papers given to patient.

## 2023-02-19 NOTE — Telephone Encounter (Signed)
Reviewed w K. Janee Morn, PA-C.  Left message for patient to call back.  She is still at the hospital and does not have cell phone.  She is being discharged today.

## 2023-02-21 ENCOUNTER — Telehealth: Payer: Self-pay | Admitting: *Deleted

## 2023-02-25 ENCOUNTER — Ambulatory Visit (INDEPENDENT_AMBULATORY_CARE_PROVIDER_SITE_OTHER): Payer: Medicare Other

## 2023-02-25 VITALS — BP 124/70 | HR 81 | Temp 98.0°F | Ht 62.5 in | Wt 263.6 lb

## 2023-02-25 DIAGNOSIS — Z0000 Encounter for general adult medical examination without abnormal findings: Secondary | ICD-10-CM

## 2023-02-25 DIAGNOSIS — Z23 Encounter for immunization: Secondary | ICD-10-CM

## 2023-02-25 NOTE — Progress Notes (Signed)
Subjective:   Charlene Tapia is a 72 y.o. female who presents for an Initial Medicare Annual Wellness Visit.  Visit Complete: In person  Cardiac Risk Factors include: advanced age (>69men, >23 women);dyslipidemia;family history of premature cardiovascular disease;hypertension;obesity (BMI >30kg/m2)     Objective:    Today's Vitals   02/25/23 1526  BP: 124/70  Pulse: 81  Temp: 98 F (36.7 C)  TempSrc: Temporal  SpO2: 96%  Weight: 263 lb 9.6 oz (119.6 kg)  Height: 5' 2.5" (1.588 m)  PainSc: 1    Body mass index is 47.44 kg/m.     02/17/2023    3:39 PM 02/04/2023   11:51 AM 11/25/2022    9:14 AM 01/08/2022   10:42 AM 09/25/2021    8:08 AM 08/04/2019    8:28 AM 09/29/2013    6:27 PM  Advanced Directives  Does Patient Have a Medical Advance Directive? No No No No No No Patient does not have advance directive;Patient would not like information  Would patient like information on creating a medical advance directive? No - Patient declined No - Patient declined No - Patient declined No - Patient declined Yes (MAU/Ambulatory/Procedural Areas - Information given) No - Patient declined   Pre-existing out of facility DNR order (yellow form or pink MOST form)       No    Current Medications (verified) Outpatient Encounter Medications as of 02/25/2023  Medication Sig   amLODipine (NORVASC) 5 MG tablet Take 1 tablet (5 mg total) by mouth daily. (Patient taking differently: Take 5 mg by mouth at bedtime.)   amoxicillin (AMOXIL) 500 MG capsule Take 4 capsules by mouth 1 hour prior to dental procedures and cleanings   aspirin EC 81 MG tablet Take 1 tablet (81 mg total) by mouth daily. Swallow whole.   Clobetasol Prop Emollient Base (CLOBETASOL PROPIONATE E) 0.05 % emollient cream Apply to affected area 2 (two) times daily. (Patient taking differently: Apply 1 Application topically 2 (two) times daily as needed (psoriasis).)   fluticasone (FLONASE) 50 MCG/ACT nasal spray Place 2 sprays into  both nostrils daily as needed for allergies or rhinitis.   furosemide (LASIX) 20 MG tablet Take 1 tablet (20 mg total) by mouth daily.   ibuprofen (ADVIL) 200 MG tablet Take 400 mg by mouth every 8 (eight) hours as needed for mild pain or moderate pain.   metoprolol succinate (TOPROL XL) 25 MG 24 hr tablet Take 0.5 tablets (12.5 mg total) by mouth at bedtime.   Multiple Vitamins-Minerals (MULTIVITAMIN GUMMIES WOMENS PO) Take 2 each by mouth daily after supper.   No facility-administered encounter medications on file as of 02/25/2023.    Allergies (verified) Phisohex [hexachlorophene]   History: Past Medical History:  Diagnosis Date   Allergy    High cholesterol    HTN (hypertension) 10/18/2022   Osteoarthritis    "knees; thumbs" (10/01/2013)   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 Lynnette Caffey and Dr. Leafy Ro   Severe aortic stenosis    Past Surgical History:  Procedure Laterality Date   GANGLION CYST EXCISION Right 01/18/1989   INGUINAL HERNIA REPAIR Bilateral 1954   "age 59 month"   INTRAOPERATIVE TRANSTHORACIC ECHOCARDIOGRAM N/A 02/04/2023   Procedure: INTRAOPERATIVE TRANSTHORACIC ECHOCARDIOGRAM;  Surgeon: Orbie Pyo, MD;  Location: [facility] INVASIVE CV LAB;  Service: Open Heart Surgery;  Laterality: N/A;   JOINT REPLACEMENT     PACEMAKER IMPLANT  N/A 02/18/2023   Procedure: PACEMAKER IMPLANT;  Surgeon: [name], MD;  Location: [facility] INVASIVE CV LAB;  Service: Cardiovascular;  Laterality: N/A;   RIGHT HEART CATH AND CORONARY ANGIOGRAPHY N/A 11/25/2022   Procedure: RIGHT HEART CATH AND CORONARY ANGIOGRAPHY;  Surgeon: Orbie Pyo, MD;  Location: [facility] INVASIVE CV LAB;  Service: Cardiovascular;  Laterality: N/A;   TEMPORARY PACEMAKER N/A 02/17/2023   Procedure: TEMPORARY PACEMAKER;  Surgeon: Yvonne Kendall, MD;  Location: [facility] INVASIVE CV LAB;  Service: Cardiovascular;  Laterality: N/A;   TOTAL  KNEE ARTHROPLASTY Left 08/16/2013   Procedure: TOTAL KNEE ARTHROPLASTY;  Surgeon: Nestor Lewandowsky, MD;  Location: [facility] OR;  Service: Orthopedics;  Laterality: Left;   TOTAL KNEE ARTHROPLASTY Right 09/29/2013   TOTAL KNEE ARTHROPLASTY Right 09/29/2013   Procedure: TOTAL KNEE ARTHROPLASTY;  Surgeon: Nestor Lewandowsky, MD;  Location: [facility] OR;  Service: Orthopedics;  Laterality: Right;   TRANSCATHETER AORTIC VALVE REPLACEMENT, TRANSFEMORAL N/A 02/04/2023   Procedure: Transcatheter Aortic Valve Replacement, Transfemoral;  Surgeon: Orbie Pyo, MD;  Location: [facility] INVASIVE CV LAB;  Service: Open Heart Surgery;  Laterality: N/A;   Family History  Problem Relation Age of Onset   Heart disease Father    Hypertension Father    Alcohol abuse Father    Social History   Socioeconomic History   Marital status: Married    Spouse name: Not on file   Number of children: Not on file   Years of education: Not on file   Highest education level: Not on file  Occupational History   Not on file  Tobacco Use   Smoking status: Never   Smokeless tobacco: Never  Substance and Sexual Activity   Alcohol use: Yes    Comment: Social drinking wine only   Drug use: No   Sexual activity: Not Currently    Comment: 67 years of age/spouse w/ ED  Other Topics Concern   Not on file  Social History Narrative   Not on file   Social Determinants of Health   Financial Resource Strain: Low Risk  (02/25/2023)   Overall Financial Resource Strain (CARDIA)    Difficulty of Paying Living Expenses: Not hard at all  Food Insecurity: No Food Insecurity (02/25/2023)   Hunger Vital Sign    Worried About Running Out of Food in the Last Year: Never true    Ran Out of Food in the Last Year: Never true  Transportation Needs: No Transportation Needs (02/25/2023)   PRAPARE - Administrator, Civil Service (Medical): No    Lack of Transportation (Non-Medical): No  Physical Activity: Insufficiently Active (02/25/2023)    Exercise Vital Sign    Days of Exercise per Week: 3 days    Minutes of Exercise per Session: 30 min  Stress: No Stress Concern Present (02/25/2023)   [employer] of Occupational Health - Occupational Stress Questionnaire    Feeling of Stress : Not at all  Social Connections: Socially Integrated (02/25/2023)   Social Connection and Isolation Panel [NHANES]    Frequency of Communication with Friends and Family: More than three times a week    Frequency of Social Gatherings with Friends and Family: More than three times a week    Attends Religious Services: More than 4 times per year    Active Member of [employer] or Organizations: Yes    Attends Engineer, structural: More than 4 times per year    Marital Status: Married    Tobacco Counseling Counseling given:  Not Answered   Clinical Intake:  Pre-visit preparation completed: Yes  Pain : 0-10 Pain Score: 1  Pain Location: Other (Comment) (pacemaker site)     BMI - recorded: 47.44 Nutritional Status: BMI > 30  Obese Nutritional Risks: None Diabetes: No  How often do you need to have someone help you when you read instructions, pamphlets, or other written materials from your doctor or pharmacy?: 1 - Never What is the last grade level you completed in school?: Bachelor's of Art degree  Interpreter Needed?: No  Information entered by :: Aeneas Longsworth N. Kester Stimpson, LPN.   Activities of Daily Living    02/25/2023    4:18 PM 02/17/2023    3:39 PM  In your present state of health, do you have any difficulty performing the following activities:  Hearing? 0 0  Vision? 0 0  Difficulty concentrating or making decisions? 0 0  Walking or climbing stairs? 0   Dressing or bathing? 0   Doing errands, shopping? 0 0  Preparing Food and eating ? N   Using the Toilet? N   In the past six months, have you accidently leaked urine? N   Comment wears a mini pad for protection   Do you have problems with loss of bowel control? N    Managing your Medications? N   Managing your Finances? N   Housekeeping or managing your Housekeeping? N     Patient Care Team: Elenore Paddy, NP as PCP - General (Nurse Practitioner) Orbie Pyo, MD as PCP - Cardiology (Cardiology)  Indicate any recent Medical Services you may have received from other than [facility] providers in the past year (date may be approximate).     Assessment:   This is a routine wellness examination for Rebeca.  Hearing/Vision screen Hearing Screening - Comments:: Patient denied any hearing difficulty.   No hearing aids.  Vision Screening - Comments:: Patient does wear otc readers.  No recent eye exam. Patient will schedule eye exam for herself and spouse.    Goals Addressed             This Visit's Progress    Client understands the importance of follow-up with providers by attending scheduled visits        Depression Screen    02/25/2023    4:14 PM 11/01/2022    8:37 AM 10/18/2022   11:03 AM 11/12/2021    8:41 AM 09/25/2021    8:08 AM 07/23/2018   12:00 PM 12/13/2015    8:55 AM  PHQ 2/9 Scores  PHQ - 2 Score 0 0 0 0 0 0 0  PHQ- 9 Score 0   0       Fall Risk    02/25/2023    4:18 PM 11/01/2022    8:37 AM 10/18/2022   11:03 AM 01/17/2022    8:52 AM 09/25/2021    8:08 AM  Fall Risk   Falls in the past year? 0 0 0 0 0  Number falls in past yr: 0 0 0  0  Injury with Fall? 0 0 0    Risk for fall due to : No Fall Risks No Fall Risks No Fall Risks    Follow up Falls prevention discussed Falls evaluation completed Falls evaluation completed Falls evaluation completed     MEDICARE RISK AT HOME: Medicare Risk at Home Any stairs in or around the home?: Yes If so, are there any without handrails?: No Home free of loose throw rugs in walkways,  pet beds, electrical cords, etc?: Yes Adequate lighting in your home to reduce risk of falls?: Yes Life alert?: No Use of a cane, walker or w/c?: No Grab bars in the bathroom?: Yes Shower chair or bench  in shower?: No Elevated toilet seat or a handicapped toilet?: Yes  TIMED UP AND GO:  Was the test performed? Yes  Length of time to ambulate 10 feet: 10 sec Gait steady and fast without use of assistive device    Cognitive Function:        02/25/2023    3:29 PM 07/23/2018   11:48 AM  6CIT Screen  What Year? 0 points 0 points  What month? 0 points 0 points  What time? 0 points 0 points  Count back from 20 0 points 0 points  Months in reverse 0 points 0 points  Repeat phrase 0 points 0 points  Total Score 0 points 0 points    Immunizations Immunization History  Administered Date(s) Administered   Fluad Trivalent(High Dose 65+) 02/25/2023   Influenza-Unspecified 01/18/2013   Pneumococcal Conjugate-13 07/23/2018   Pneumococcal Polysaccharide-23 08/01/2021   Td 07/23/2018   Zoster Recombinant(Shingrix) 08/01/2021, 11/12/2021    TDAP status: Up to date  Flu Vaccine status: Completed at today's visit  Pneumococcal vaccine status: Up to date  Covid-19 vaccine status: Information provided on how to obtain vaccines.   Qualifies for Shingles Vaccine? Yes   Zostavax completed No   Shingrix Completed?: Yes  Screening Tests Health Maintenance  Topic Date Due   Colonoscopy  Never done   MAMMOGRAM  08/17/2009   DEXA SCAN  Never done   COVID-19 Vaccine (1 - 2023-24 season) Never done   Medicare Annual Wellness (AWV)  02/25/2024   DTaP/Tdap/Td (2 - Tdap) 07/22/2028   Pneumonia Vaccine 22+ Years old  Completed   INFLUENZA VACCINE  Completed   Hepatitis C Screening  Completed   Zoster Vaccines- Shingrix  Completed   HPV VACCINES  Aged Out    Health Maintenance  Health Maintenance Due  Topic Date Due   Colonoscopy  Never done   MAMMOGRAM  08/17/2009   DEXA SCAN  Never done   COVID-19 Vaccine (1 - 2023-24 season) Never done    Colorectal cancer screening: Type of screening: Cologuard. Completed 09/28/2018. Repeat every 3 years  Mammogram status: Postponed   Bone  Density status: Postponed  Lung Cancer Screening: (Low Dose CT Chest recommended if Age 24-80 years, 20 pack-year currently smoking OR have quit w/in 15years.) does not qualify.   Lung Cancer Screening Referral: no  Additional Screening:  Hepatitis C Screening: does qualify; Completed 07/23/2018  Vision Screening: Recommended annual ophthalmology exams for early detection of glaucoma and other disorders of the eye. Is the patient up to date with their annual eye exam?  No  Who is the provider or what is the name of the office in which the patient attends annual eye exams? Patient will schedule for herself and spouse If pt is not established with a provider, would they like to be referred to a provider to establish care? No .   Dental Screening: Recommended annual dental exams for proper oral hygiene  Diabetic Foot Exam: N/A  Community Resource Referral / Chronic Care Management: CRR required this visit?  No   CCM required this visit?  No     Plan:     I have personally reviewed and noted the following in the patient's chart:   Medical and social history Use of alcohol, tobacco  or illicit drugs  Current medications and supplements including opioid prescriptions. Patient is not currently taking opioid prescriptions. Functional ability and status Nutritional status Physical activity Advanced directives List of other physicians Hospitalizations, surgeries, and ER visits in previous 12 months Vitals Screenings to include cognitive, depression, and falls Referrals and appointments  In addition, I have reviewed and discussed with patient certain preventive protocols, quality metrics, and best practice recommendations. A written personalized care plan for preventive services as well as general preventive health recommendations were provided to patient.     Mickeal Needy, LPN   16/05/958   After Visit Summary: (In Person-Printed) AVS printed and given to the  patient  Nurse Notes: Normal cognitive status assessed by direct observation by this Nurse Health Advisor. No abnormalities found.

## 2023-02-25 NOTE — Patient Instructions (Addendum)
Charlene Tapia , Thank you for taking time to come for your Medicare Wellness Visit. I appreciate your ongoing commitment to your health goals. Please review the following plan we discussed and let me know if I can assist you in the future.   Referrals/Orders/Follow-Ups/Clinician Recommendations: No  This is a list of the screening recommended for you and due dates:  Health Maintenance  Topic Date Due   Colon Cancer Screening  Never done   Mammogram  08/17/2009   DEXA scan (bone density measurement)  Never done   COVID-19 Vaccine (1 - 2023-24 season) Never done   Medicare Annual Wellness Visit  02/25/2024   DTaP/Tdap/Td vaccine (2 - Tdap) 07/22/2028   Pneumonia Vaccine  Completed   Flu Shot  Completed   Hepatitis C Screening  Completed   Zoster (Shingles) Vaccine  Completed   HPV Vaccine  Aged Out    Advanced directives: (Declined) Advance directive discussed with you today. Even though you declined this today, please call our office should you change your mind, and we can give you the proper paperwork for you to fill out.  Next Medicare Annual Wellness Visit scheduled for next year: No

## 2023-03-04 ENCOUNTER — Telehealth (HOSPITAL_COMMUNITY): Payer: Self-pay

## 2023-03-04 NOTE — Telephone Encounter (Signed)
No response from pt in regards to cardiac rehab. Closed referral 

## 2023-03-04 NOTE — Progress Notes (Unsigned)
Descending The vessel exhibits minimal luminal irregularities.  Left Circumflex The vessel exhibits minimal luminal irregularities.  Right Coronary Artery There is mild diffuse disease throughout the vessel.  Intervention  No interventions have been documented.     ECHOCARDIOGRAM  ECHOCARDIOGRAM  COMPLETE 03/05/2023  Narrative ECHOCARDIOGRAM REPORT    Patient Name:   Charlene Tapia Date of Exam: 03/05/2023 Medical Rec #:  [id number]         Height:       62.5 in Accession #:    [id number]        Weight:       263.6 lb Date of Birth:  [birth year]         BSA:          2.163 m Patient Age:    72 years          BP:           132/66 mmHg Patient Gender: F                 HR:           68 bpm. Exam Location:  [address]  Procedure: 2D Echo, Cardiac Doppler, Color Doppler and Intracardiac Opacification Agent  Indications:    1 month TAVR Evaluation Z95.2  History:        Patient has prior history of Echocardiogram examinations, most recent 02/05/2023. Pacemaker; Risk Factors:Hypertension. Aortic Valve: 29 mm Medtronic CoreValve-Evolut Pro prosthetic, stented (TAVR) valve is present in the aortic position. Procedure Date: 02/04/2023.  Sonographer:    Thurman Coyer RDCS Referring Phys: [phone] Maciej Schweitzer D Jaliana Medellin   Sonographer Comments: Technically difficult study due to poor echo windows. IMPRESSIONS   1. Left ventricular ejection fraction, by estimation, is 65 to 70%. The left ventricle has normal function. The left ventricle has no regional wall motion abnormalities. There is mild left ventricular hypertrophy. Left ventricular diastolic parameters are indeterminate. 2. Right ventricular systolic function is normal. The right ventricular size is normal. Tricuspid regurgitation signal is inadequate for assessing PA pressure. 3. The mitral valve is degenerative. Trivial mitral valve regurgitation. Mild mitral stenosis. The mean mitral valve gradient is 4.7 mmHg with average heart rate of 69 bpm. Severe mitral annular calcification. 4. The aortic valve has been repaired/replaced. Aortic valve regurgitation is mild- there appears to be both posterior paravalvular regurgitation, as well as posterior valvular regurgitation, likely mild in total. There is a 29 mm  Medtronic CoreValve-Evolut Pro prosthetic (TAVR) valve present in the aortic position. Procedure Date: 02/04/2023. Echo findings are consistent with normal structure and function of the aortic valve prosthesis. Aortic valve area, by VTI measures 3.30 cm. Aortic valve mean gradient measures 7.0 mmHg. Aortic valve Vmax measures 1.87 m/s. Aortic valve acceleration time measures 70 msec. 5. The inferior vena cava is normal in size with <50% respiratory variability, suggesting right atrial pressure of 8 mmHg.  FINDINGS Left Ventricle: Left ventricular ejection fraction, by estimation, is 65 to 70%. The left ventricle has normal function. The left ventricle has no regional wall motion abnormalities. Definity contrast agent was given IV to delineate the left ventricular endocardial borders. The left ventricular internal cavity size was normal in size. There is mild left ventricular hypertrophy. Left ventricular diastolic parameters are indeterminate.  Right Ventricle: The right ventricular size is normal. No increase in right ventricular wall thickness. Right ventricular systolic function is normal. Tricuspid regurgitation signal is inadequate for assessing PA pressure.  Left Atrium: Left atrial size was normal in  Descending The vessel exhibits minimal luminal irregularities.  Left Circumflex The vessel exhibits minimal luminal irregularities.  Right Coronary Artery There is mild diffuse disease throughout the vessel.  Intervention  No interventions have been documented.     ECHOCARDIOGRAM  ECHOCARDIOGRAM  COMPLETE 03/05/2023  Narrative ECHOCARDIOGRAM REPORT    Patient Name:   Charlene Tapia Date of Exam: 03/05/2023 Medical Rec #:  [id number]         Height:       62.5 in Accession #:    [id number]        Weight:       263.6 lb Date of Birth:  [birth year]         BSA:          2.163 m Patient Age:    72 years          BP:           132/66 mmHg Patient Gender: F                 HR:           68 bpm. Exam Location:  [address]  Procedure: 2D Echo, Cardiac Doppler, Color Doppler and Intracardiac Opacification Agent  Indications:    1 month TAVR Evaluation Z95.2  History:        Patient has prior history of Echocardiogram examinations, most recent 02/05/2023. Pacemaker; Risk Factors:Hypertension. Aortic Valve: 29 mm Medtronic CoreValve-Evolut Pro prosthetic, stented (TAVR) valve is present in the aortic position. Procedure Date: 02/04/2023.  Sonographer:    Thurman Coyer RDCS Referring Phys: [phone] Maciej Schweitzer D Jaliana Medellin   Sonographer Comments: Technically difficult study due to poor echo windows. IMPRESSIONS   1. Left ventricular ejection fraction, by estimation, is 65 to 70%. The left ventricle has normal function. The left ventricle has no regional wall motion abnormalities. There is mild left ventricular hypertrophy. Left ventricular diastolic parameters are indeterminate. 2. Right ventricular systolic function is normal. The right ventricular size is normal. Tricuspid regurgitation signal is inadequate for assessing PA pressure. 3. The mitral valve is degenerative. Trivial mitral valve regurgitation. Mild mitral stenosis. The mean mitral valve gradient is 4.7 mmHg with average heart rate of 69 bpm. Severe mitral annular calcification. 4. The aortic valve has been repaired/replaced. Aortic valve regurgitation is mild- there appears to be both posterior paravalvular regurgitation, as well as posterior valvular regurgitation, likely mild in total. There is a 29 mm  Medtronic CoreValve-Evolut Pro prosthetic (TAVR) valve present in the aortic position. Procedure Date: 02/04/2023. Echo findings are consistent with normal structure and function of the aortic valve prosthesis. Aortic valve area, by VTI measures 3.30 cm. Aortic valve mean gradient measures 7.0 mmHg. Aortic valve Vmax measures 1.87 m/s. Aortic valve acceleration time measures 70 msec. 5. The inferior vena cava is normal in size with <50% respiratory variability, suggesting right atrial pressure of 8 mmHg.  FINDINGS Left Ventricle: Left ventricular ejection fraction, by estimation, is 65 to 70%. The left ventricle has normal function. The left ventricle has no regional wall motion abnormalities. Definity contrast agent was given IV to delineate the left ventricular endocardial borders. The left ventricular internal cavity size was normal in size. There is mild left ventricular hypertrophy. Left ventricular diastolic parameters are indeterminate.  Right Ventricle: The right ventricular size is normal. No increase in right ventricular wall thickness. Right ventricular systolic function is normal. Tricuspid regurgitation signal is inadequate for assessing PA pressure.  Left Atrium: Left atrial size was normal in  [facility]   MULTIDISCIPLINARY HEART VALVE TEAM  Structural Heart Office Note:  .   Date:  03/05/2023  ID:  Ronnald Nian, DOB 1950/11/06, MRN [id number] PCP: Elenore Paddy, NP  [facility] Providers Cardiologist:  Orbie Pyo, MD {  History of Present Illness: .   Charlene Tapia is a 72 y.o. 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    Ms. Darrow was recenlty noted to have a heart murmur by PCP which prompted an echo. Echo 11/20/22 showed EF 60% and severe mean grad 49 mmHg, AVA 1.02 cm2 (felt to be over-estimated based on an inaccurate LVOT diameter), moderate AI, moderate MS/mild MR. [facility] 11/25/22 showed minimal CAD   She was evaluated by the multidisciplinary valve team and felt to have severe, symptomatic aortic stenosis and to be a suitable candidate. She is now s/p successful TAVR with a 29 mm Evolut FX THV via the TF approach on 02/04/23. Post operative echo with hyperdynamic LV at 70-75% with moderate MS and stable TAVR valve function with a mean gradient at 17.69mmHg, 31.82mmHg, and AVA by VTI 1.32cm2. She was started on ASA 81mg  daily, Toprol 12.5mg , and Lasix 20mg  daily.   In TOC follow up she was doing well however noted to have new LBBB on EKG. Discussed monitoring for acute s/s of CHB with ED precautions. She then called the office over the following weekend with reports of dizziness/worsening lightheadedness/dizziness and several episodes of near syncope. Advised to report to ED and found to have bradycardia and intermittent second degree HB. She was admitted to EP service and ultimately underwent dual chamber pacemaker with left bundle lead placement 10/1. Post procedure CXR stable with great lead position.  Today she is here today alone and reports she feels so much better after PPM placement. She no longer has SOB and her energy has improved. She  is able to perform tasks like making the bed without issues. Echo today with LVEF at 65-70% with trivial MR, mild MS with severe MAC. There is mild AR with both posterior paravalvular regurgitation and posterior valvular regurgitation per echo report felt to be mild in total. Medtronic CoreValve Evolut Pro with a mean gradient at , peak , AVA by VTI at 3.30cm2, and DI 0.73. She denies chest pain, SOB, palpitations, LE edema, orthopnea, PND, dizziness, or syncope. Denies bleeding in stool or urine.    Studies Reviewed: .   Cardiac Studies & Procedures   CARDIAC CATHETERIZATION  CARDIAC CATHETERIZATION 02/17/2023  Narrative Conclusions: Successful placement of temporary transvenous pacemaker via the right internal jugular vein.  Recommendations: Obtain STAT PCXR when patient arrives in ICU. Ongoing management of symptomatic bradycardia and second degree AV bloc s/p TAVR per electrophysiology.  Yvonne Kendall, MD [facility]   CARDIAC CATHETERIZATION  CARDIAC CATHETERIZATION 11/25/2022  Narrative 1.  Minimal obstructive coronary artery disease. 2.  Fick cardiac output 10.8 liters per minute and Fick cardiac index of 5.0 liters per minute per square meter squared with the following hemodynamics:  Right atrial pressure mean 7 mmHg Right ventricular pressure 44/5 with an end-diastolic pressure of 15 mmHg Wedge pressure mean 18 mmHg Pulmonary pressure 41/18 with a mean of 28 mmHg Pulmonary vascular resistance of 1.2 Woods units  Recommendation: Continue evaluation for aortic valve intervention.  Findings Coronary Findings Diagnostic  Dominance: Right  Left Anterior  Descending The vessel exhibits minimal luminal irregularities.  Left Circumflex The vessel exhibits minimal luminal irregularities.  Right Coronary Artery There is mild diffuse disease throughout the vessel.  Intervention  No interventions have been documented.     ECHOCARDIOGRAM  ECHOCARDIOGRAM  COMPLETE 03/05/2023  Narrative ECHOCARDIOGRAM REPORT    Patient Name:   Charlene Tapia Date of Exam: 03/05/2023 Medical Rec #:  [id number]         Height:       62.5 in Accession #:    [id number]        Weight:       263.6 lb Date of Birth:  [birth year]         BSA:          2.163 m Patient Age:    72 years          BP:           132/66 mmHg Patient Gender: F                 HR:           68 bpm. Exam Location:  [address]  Procedure: 2D Echo, Cardiac Doppler, Color Doppler and Intracardiac Opacification Agent  Indications:    1 month TAVR Evaluation Z95.2  History:        Patient has prior history of Echocardiogram examinations, most recent 02/05/2023. Pacemaker; Risk Factors:Hypertension. Aortic Valve: 29 mm Medtronic CoreValve-Evolut Pro prosthetic, stented (TAVR) valve is present in the aortic position. Procedure Date: 02/04/2023.  Sonographer:    Thurman Coyer RDCS Referring Phys: [phone] Maciej Schweitzer D Jaliana Med[place]   Sonographer Comments: Technically difficult study due to poor echo windows. IMPRESSIONS   1. Left ventricular ejection fraction, by estimation, is 65 to 70%. The left ventricle has normal function. The left ventricle has no regional wall motion abnormalities. There is mild left ventricular hypertrophy. Left ventricular diastolic parameters are indeterminate. 2. Right ventricular systolic function is normal. The right ventricular size is normal. Tricuspid regurgitation signal is inadequate for assessing PA pressure. 3. The mitral valve is degenerative. Trivial mitral valve regurgitation. Mild mitral stenosis. The mean mitral valve gradient is 4.7 mmHg with average heart rate of 69 bpm. Severe mitral annular calcification. 4. The aortic valve has been repaired/replaced. Aortic valve regurgitation is mild- there appears to be both posterior paravalvular regurgitation, as well as posterior valvular regurgitation, likely mild in total. There is a 29 mm  Medtronic CoreValve-Evolut Pro prosthetic (TAVR) valve present in the aortic position. Procedure Date: 02/04/2023. Echo findings are consistent with normal structure and function of the aortic valve prosthesis. Aortic valve area, by VTI measures 3.30 cm. Aortic valve mean gradient measures 7.0 mmHg. Aortic valve Vmax measures 1.87 m/s. Aortic valve acceleration time measures 70 msec. 5. The inferior vena cava is normal in size with <50% respiratory variability, suggesting right atrial pressure of 8 mmHg.  FINDINGS Left Ventricle: Left ventricular ejection fraction, by estimation, is 65 to 70%. The left ventricle has normal function. The left ventricle has no regional wall motion abnormalities. Definity contrast agent was given IV to delineate the left ventricular endocardial borders. The left ventricular internal cavity size was normal in size. There is mild left ventricular hypertrophy. Left ventricular diastolic parameters are indeterminate.  Right Ventricle: The right ventricular size is normal. No increase in right ventricular wall thickness. Right ventricular systolic function is normal. Tricuspid regurgitation signal is inadequate for assessing PA pressure.  Left Atrium: Left atrial size was normal in  [facility]   MULTIDISCIPLINARY HEART VALVE TEAM  Structural Heart Office Note:  .   Date:  03/05/2023  ID:  Ronnald Nian, DOB 1950/11/06, MRN [id number] PCP: Elenore Paddy, NP  [facility] Providers Cardiologist:  Orbie Pyo, MD {  History of Present Illness: .   Charlene Tapia is a 72 y.o. 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    Ms. Darrow was recenlty noted to have a heart murmur by PCP which prompted an echo. Echo 11/20/22 showed EF 60% and severe mean grad 49 mmHg, AVA 1.02 cm2 (felt to be over-estimated based on an inaccurate LVOT diameter), moderate AI, moderate MS/mild MR. [facility] 11/25/22 showed minimal CAD   She was evaluated by the multidisciplinary valve team and felt to have severe, symptomatic aortic stenosis and to be a suitable candidate. She is now s/p successful TAVR with a 29 mm Evolut FX THV via the TF approach on 02/04/23. Post operative echo with hyperdynamic LV at 70-75% with moderate MS and stable TAVR valve function with a mean gradient at 17.69mmHg, 31.82mmHg, and AVA by VTI 1.32cm2. She was started on ASA 81mg  daily, Toprol 12.5mg , and Lasix 20mg  daily.   In TOC follow up she was doing well however noted to have new LBBB on EKG. Discussed monitoring for acute s/s of CHB with ED precautions. She then called the office over the following weekend with reports of dizziness/worsening lightheadedness/dizziness and several episodes of near syncope. Advised to report to ED and found to have bradycardia and intermittent second degree HB. She was admitted to EP service and ultimately underwent dual chamber pacemaker with left bundle lead placement 10/1. Post procedure CXR stable with great lead position.  Today she is here today alone and reports she feels so much better after PPM placement. She no longer has SOB and her energy has improved. She  is able to perform tasks like making the bed without issues. Echo today with LVEF at 65-70% with trivial MR, mild MS with severe MAC. There is mild AR with both posterior paravalvular regurgitation and posterior valvular regurgitation per echo report felt to be mild in total. Medtronic CoreValve Evolut Pro with a mean gradient at , peak , AVA by VTI at 3.30cm2, and DI 0.73. She denies chest pain, SOB, palpitations, LE edema, orthopnea, PND, dizziness, or syncope. Denies bleeding in stool or urine.    Studies Reviewed: .   Cardiac Studies & Procedures   CARDIAC CATHETERIZATION  CARDIAC CATHETERIZATION 02/17/2023  Narrative Conclusions: Successful placement of temporary transvenous pacemaker via the right internal jugular vein.  Recommendations: Obtain STAT PCXR when patient arrives in ICU. Ongoing management of symptomatic bradycardia and second degree AV bloc s/p TAVR per electrophysiology.  Yvonne Kendall, MD [facility]   CARDIAC CATHETERIZATION  CARDIAC CATHETERIZATION 11/25/2022  Narrative 1.  Minimal obstructive coronary artery disease. 2.  Fick cardiac output 10.8 liters per minute and Fick cardiac index of 5.0 liters per minute per square meter squared with the following hemodynamics:  Right atrial pressure mean 7 mmHg Right ventricular pressure 44/5 with an end-diastolic pressure of 15 mmHg Wedge pressure mean 18 mmHg Pulmonary pressure 41/18 with a mean of 28 mmHg Pulmonary vascular resistance of 1.2 Woods units  Recommendation: Continue evaluation for aortic valve intervention.  Findings Coronary Findings Diagnostic  Dominance: Right  Left Anterior  Descending The vessel exhibits minimal luminal irregularities.  Left Circumflex The vessel exhibits minimal luminal irregularities.  Right Coronary Artery There is mild diffuse disease throughout the vessel.  Intervention  No interventions have been documented.     ECHOCARDIOGRAM  ECHOCARDIOGRAM  COMPLETE 03/05/2023  Narrative ECHOCARDIOGRAM REPORT    Patient Name:   Charlene Tapia Date of Exam: 03/05/2023 Medical Rec #:  [id number]         Height:       62.5 in Accession #:    [id number]        Weight:       263.6 lb Date of Birth:  [birth year]         BSA:          2.163 m Patient Age:    72 years          BP:           132/66 mmHg Patient Gender: F                 HR:           68 bpm. Exam Location:  [address]  Procedure: 2D Echo, Cardiac Doppler, Color Doppler and Intracardiac Opacification Agent  Indications:    1 month TAVR Evaluation Z95.2  History:        Patient has prior history of Echocardiogram examinations, most recent 02/05/2023. Pacemaker; Risk Factors:Hypertension. Aortic Valve: 29 mm Medtronic CoreValve-Evolut Pro prosthetic, stented (TAVR) valve is present in the aortic position. Procedure Date: 02/04/2023.  Sonographer:    Thurman Coyer RDCS Referring Phys: [phone] Maciej Schweitzer D Jaliana Medellin   Sonographer Comments: Technically difficult study due to poor echo windows. IMPRESSIONS   1. Left ventricular ejection fraction, by estimation, is 65 to 70%. The left ventricle has normal function. The left ventricle has no regional wall motion abnormalities. There is mild left ventricular hypertrophy. Left ventricular diastolic parameters are indeterminate. 2. Right ventricular systolic function is normal. The right ventricular size is normal. Tricuspid regurgitation signal is inadequate for assessing PA pressure. 3. The mitral valve is degenerative. Trivial mitral valve regurgitation. Mild mitral stenosis. The mean mitral valve gradient is 4.7 mmHg with average heart rate of 69 bpm. Severe mitral annular calcification. 4. The aortic valve has been repaired/replaced. Aortic valve regurgitation is mild- there appears to be both posterior paravalvular regurgitation, as well as posterior valvular regurgitation, likely mild in total. There is a 29 mm  Medtronic CoreValve-Evolut Pro prosthetic (TAVR) valve present in the aortic position. Procedure Date: 02/04/2023. Echo findings are consistent with normal structure and function of the aortic valve prosthesis. Aortic valve area, by VTI measures 3.30 cm. Aortic valve mean gradient measures 7.0 mmHg. Aortic valve Vmax measures 1.87 m/s. Aortic valve acceleration time measures 70 msec. 5. The inferior vena cava is normal in size with <50% respiratory variability, suggesting right atrial pressure of 8 mmHg.  FINDINGS Left Ventricle: Left ventricular ejection fraction, by estimation, is 65 to 70%. The left ventricle has normal function. The left ventricle has no regional wall motion abnormalities. Definity contrast agent was given IV to delineate the left ventricular endocardial borders. The left ventricular internal cavity size was normal in size. There is mild left ventricular hypertrophy. Left ventricular diastolic parameters are indeterminate.  Right Ventricle: The right ventricular size is normal. No increase in right ventricular wall thickness. Right ventricular systolic function is normal. Tricuspid regurgitation signal is inadequate for assessing PA pressure.  Left Atrium: Left atrial size was normal in

## 2023-03-05 ENCOUNTER — Ambulatory Visit: Payer: Medicare Other

## 2023-03-05 ENCOUNTER — Other Ambulatory Visit (HOSPITAL_COMMUNITY): Payer: Self-pay

## 2023-03-05 ENCOUNTER — Ambulatory Visit (INDEPENDENT_AMBULATORY_CARE_PROVIDER_SITE_OTHER): Payer: Medicare Other

## 2023-03-05 ENCOUNTER — Ambulatory Visit (HOSPITAL_COMMUNITY): Payer: Medicare Other | Attending: Internal Medicine

## 2023-03-05 ENCOUNTER — Other Ambulatory Visit (HOSPITAL_COMMUNITY): Payer: Self-pay | Admitting: Cardiology

## 2023-03-05 VITALS — BP 132/66 | HR 71 | Ht 62.5 in | Wt 265.0 lb

## 2023-03-05 DIAGNOSIS — E785 Hyperlipidemia, unspecified: Secondary | ICD-10-CM

## 2023-03-05 DIAGNOSIS — R001 Bradycardia, unspecified: Secondary | ICD-10-CM | POA: Insufficient documentation

## 2023-03-05 DIAGNOSIS — I350 Nonrheumatic aortic (valve) stenosis: Secondary | ICD-10-CM | POA: Diagnosis present

## 2023-03-05 DIAGNOSIS — I10 Essential (primary) hypertension: Secondary | ICD-10-CM | POA: Diagnosis not present

## 2023-03-05 DIAGNOSIS — Z952 Presence of prosthetic heart valve: Secondary | ICD-10-CM | POA: Diagnosis not present

## 2023-03-05 DIAGNOSIS — I447 Left bundle-branch block, unspecified: Secondary | ICD-10-CM | POA: Diagnosis present

## 2023-03-05 LAB — CUP PACEART INCLINIC DEVICE CHECK
Battery Remaining Longevity: 123 mo
Battery Voltage: 3.07 V
Brady Statistic RA Percent Paced: 6.2 %
Brady Statistic RV Percent Paced: 99.85 %
Date Time Interrogation Session: 20241016171503
Implantable Lead Connection Status: 753985
Implantable Lead Connection Status: 753985
Implantable Lead Implant Date: 20241001
Implantable Lead Implant Date: 20241001
Implantable Lead Location: 753859
Implantable Lead Location: 753860
Implantable Pulse Generator Implant Date: 20241001
Lead Channel Impedance Value: 400 Ohm
Lead Channel Impedance Value: 475 Ohm
Lead Channel Pacing Threshold Amplitude: 0.5 V
Lead Channel Pacing Threshold Amplitude: 0.5 V
Lead Channel Pacing Threshold Amplitude: 0.5 V
Lead Channel Pacing Threshold Amplitude: 0.5 V
Lead Channel Pacing Threshold Pulse Width: 0.5 ms
Lead Channel Pacing Threshold Pulse Width: 0.5 ms
Lead Channel Pacing Threshold Pulse Width: 0.5 ms
Lead Channel Pacing Threshold Pulse Width: 0.5 ms
Lead Channel Sensing Intrinsic Amplitude: 12 mV
Lead Channel Sensing Intrinsic Amplitude: 4.5 mV
Lead Channel Setting Pacing Amplitude: 0.875
Lead Channel Setting Pacing Amplitude: 3.5 V
Lead Channel Setting Pacing Pulse Width: 0.5 ms
Lead Channel Setting Sensing Sensitivity: 2 mV
Pulse Gen Model: 2272
Pulse Gen Serial Number: 8211315

## 2023-03-05 LAB — ECHOCARDIOGRAM COMPLETE
AR max vel: 3.12 cm2
AV Area VTI: 3.3 cm2
AV Area mean vel: 3.19 cm2
AV Mean grad: 7 mm[Hg]
AV Peak grad: 14 mm[Hg]
Ao pk vel: 1.87 m/s
Area-P 1/2: 3.08 cm2
MV VTI: 2.48 cm2
S' Lateral: 3.1 cm

## 2023-03-05 MED ORDER — FUROSEMIDE 20 MG PO TABS
20.0000 mg | ORAL_TABLET | Freq: Every day | ORAL | 3 refills | Status: DC
  Filled 2023-03-05: qty 90, 90d supply, fill #0

## 2023-03-05 MED ORDER — METOPROLOL SUCCINATE ER 25 MG PO TB24
12.5000 mg | ORAL_TABLET | Freq: Every day | ORAL | 3 refills | Status: DC
  Filled 2023-03-05 – 2023-04-07 (×3): qty 45, 90d supply, fill #0
  Filled 2023-07-10: qty 45, 90d supply, fill #1
  Filled 2023-10-07: qty 45, 90d supply, fill #2
  Filled 2024-01-06: qty 45, 90d supply, fill #3

## 2023-03-05 MED ORDER — PERFLUTREN LIPID MICROSPHERE
1.0000 mL | INTRAVENOUS | Status: AC | PRN
Start: 2023-03-05 — End: 2023-03-05
  Administered 2023-03-05: 2 mL via INTRAVENOUS

## 2023-03-05 NOTE — Telephone Encounter (Signed)
Paperwork given to APP today to review/complete for patient while she was here today for appointement.

## 2023-03-05 NOTE — Patient Instructions (Signed)

## 2023-03-05 NOTE — Progress Notes (Signed)

## 2023-03-05 NOTE — Patient Instructions (Signed)
Medication Instructions:  Your physician recommends that you continue on your current medications as directed. Please refer to the Current Medication list given to you today.  *If you need a refill on your cardiac medications before your next appointment, please call your pharmacy*   Lab Work: NONE If you have labs (blood work) drawn today and your tests are completely normal, you will receive your results only by: MyChart Message (if you have MyChart) OR A paper copy in the mail If you have any lab test that is abnormal or we need to change your treatment, we will call you to review the results.   Testing/Procedures: NONE   Follow-Up: At [facility], you and your health needs are our priority.  As part of our continuing mission to provide you with exceptional heart care, we have created designated Provider Care Teams.  These Care Teams include your primary Cardiologist (physician) and Advanced Practice Providers (APPs -  Physician Assistants and Nurse Practitioners) who all work together to provide you with the care you need, when you need it.  We recommend signing up for the patient portal called "MyChart".  Sign up information is provided on this After Visit Summary.  MyChart is used to connect with patients for Virtual Visits (Telemedicine).  Patients are able to view lab/test results, encounter notes, upcoming appointments, etc.  Non-urgent messages can be sent to your provider as well.   To learn more about what you can do with MyChart, go to [email or web address].    Your next appointment:   6 month(s)  Provider:   Alverda Skeans, MD and seen in 1 year by APP Structural Heart team

## 2023-03-06 ENCOUNTER — Other Ambulatory Visit (HOSPITAL_COMMUNITY): Payer: Self-pay

## 2023-03-06 ENCOUNTER — Ambulatory Visit: Payer: Medicare Other

## 2023-03-07 ENCOUNTER — Other Ambulatory Visit: Payer: Self-pay | Admitting: Cardiology

## 2023-03-07 ENCOUNTER — Other Ambulatory Visit (HOSPITAL_COMMUNITY): Payer: Self-pay

## 2023-03-07 ENCOUNTER — Other Ambulatory Visit: Payer: Self-pay

## 2023-03-07 DIAGNOSIS — R590 Localized enlarged lymph nodes: Secondary | ICD-10-CM

## 2023-03-07 DIAGNOSIS — Z952 Presence of prosthetic heart valve: Secondary | ICD-10-CM

## 2023-03-07 DIAGNOSIS — I350 Nonrheumatic aortic (valve) stenosis: Secondary | ICD-10-CM

## 2023-03-25 ENCOUNTER — Other Ambulatory Visit: Payer: Self-pay | Admitting: Cardiology

## 2023-03-25 DIAGNOSIS — R590 Localized enlarged lymph nodes: Secondary | ICD-10-CM

## 2023-03-27 ENCOUNTER — Ambulatory Visit (HOSPITAL_COMMUNITY)
Admission: RE | Admit: 2023-03-27 | Discharge: 2023-03-27 | Disposition: A | Discharge status: Home / Self Care | Payer: Medicare Other | Source: Ambulatory Visit | Attending: Cardiology | Admitting: Cardiology

## 2023-03-27 DIAGNOSIS — R590 Localized enlarged lymph nodes: Secondary | ICD-10-CM | POA: Diagnosis present

## 2023-04-07 ENCOUNTER — Other Ambulatory Visit (HOSPITAL_COMMUNITY): Payer: Self-pay

## 2023-04-25 ENCOUNTER — Telehealth: Payer: Self-pay | Admitting: Nurse Practitioner

## 2023-04-25 NOTE — Telephone Encounter (Signed)
Patient called and would like to know if the scan could be rescheduled for 6 months.  Patient is not concerned but said that it was up to you.  Just let her know.

## 2023-04-30 NOTE — Telephone Encounter (Signed)
Patient returned Grace's call and would like a call back at [phone].

## 2023-05-05 NOTE — Telephone Encounter (Signed)
LVM for pt to call, unable to reach pt labs printed and send

## 2023-05-08 ENCOUNTER — Other Ambulatory Visit (HOSPITAL_COMMUNITY): Payer: Self-pay

## 2023-05-08 ENCOUNTER — Telehealth: Payer: Self-pay

## 2023-05-08 ENCOUNTER — Other Ambulatory Visit: Payer: Self-pay | Admitting: Nurse Practitioner

## 2023-05-08 DIAGNOSIS — I10 Essential (primary) hypertension: Secondary | ICD-10-CM

## 2023-05-08 MED ORDER — AMLODIPINE BESYLATE 5 MG PO TABS
5.0000 mg | ORAL_TABLET | Freq: Every day | ORAL | 2 refills | Status: DC
  Filled 2023-05-08: qty 90, 90d supply, fill #0

## 2023-05-08 NOTE — Telephone Encounter (Signed)
Copied from CRM [id number]. Topic: Clinical - Lab/Test Results >> May 07, 2023  4:20 PM Larwance Sachs wrote: Reason for CRM: Patient returned call to [facility] regarding lab results, patient asked to be call back at [phone]

## 2023-05-08 NOTE — Telephone Encounter (Signed)
Pt missed the third call for lab, result printed with lab not from provider

## 2023-05-12 ENCOUNTER — Other Ambulatory Visit (HOSPITAL_COMMUNITY): Payer: Self-pay

## 2023-05-22 ENCOUNTER — Ambulatory Visit (INDEPENDENT_AMBULATORY_CARE_PROVIDER_SITE_OTHER): Payer: Medicare Other

## 2023-05-22 DIAGNOSIS — I447 Left bundle-branch block, unspecified: Secondary | ICD-10-CM | POA: Diagnosis not present

## 2023-05-22 LAB — CUP PACEART REMOTE DEVICE CHECK
Battery Remaining Longevity: 99 mo
Battery Remaining Percentage: 95.5 %
Battery Voltage: 3.02 V
Brady Statistic AP VP Percent: 6 %
Brady Statistic AP VS Percent: 1 %
Brady Statistic AS VP Percent: 94 %
Brady Statistic AS VS Percent: 1 %
Brady Statistic RA Percent Paced: 5.6 %
Brady Statistic RV Percent Paced: 99 %
Date Time Interrogation Session: 20250102020017
Implantable Lead Connection Status: 753985
Implantable Lead Connection Status: 753985
Implantable Lead Implant Date: 20241001
Implantable Lead Implant Date: 20241001
Implantable Lead Location: 753859
Implantable Lead Location: 753860
Implantable Pulse Generator Implant Date: 20241001
Lead Channel Impedance Value: 380 Ohm
Lead Channel Impedance Value: 450 Ohm
Lead Channel Pacing Threshold Amplitude: 0.5 V
Lead Channel Pacing Threshold Amplitude: 0.5 V
Lead Channel Pacing Threshold Pulse Width: 0.5 ms
Lead Channel Pacing Threshold Pulse Width: 0.5 ms
Lead Channel Sensing Intrinsic Amplitude: 12 mV
Lead Channel Sensing Intrinsic Amplitude: 3.6 mV
Lead Channel Setting Pacing Amplitude: 0.75 V
Lead Channel Setting Pacing Amplitude: 3.5 V
Lead Channel Setting Pacing Pulse Width: 0.5 ms
Lead Channel Setting Sensing Sensitivity: 2 mV
Pulse Gen Model: 2272
Pulse Gen Serial Number: 8211315

## 2023-05-30 ENCOUNTER — Ambulatory Visit: Payer: Medicare Other | Admitting: Cardiology

## 2023-07-02 NOTE — Progress Notes (Signed)
Remote pacemaker transmission.

## 2023-07-03 ENCOUNTER — Other Ambulatory Visit: Payer: Self-pay

## 2023-07-03 ENCOUNTER — Encounter (HOSPITAL_COMMUNITY): Payer: Self-pay

## 2023-07-03 ENCOUNTER — Emergency Department (HOSPITAL_COMMUNITY): Payer: Medicare Other

## 2023-07-03 ENCOUNTER — Telehealth: Payer: Self-pay

## 2023-07-03 ENCOUNTER — Inpatient Hospital Stay (HOSPITAL_COMMUNITY)
Admission: EM | Admit: 2023-07-03 | Discharge: 2023-07-06 | DRG: 291 | Disposition: A | Discharge status: Home / Self Care | Payer: Medicare Other | Attending: Internal Medicine | Admitting: Internal Medicine

## 2023-07-03 DIAGNOSIS — J9601 Acute respiratory failure with hypoxia: Secondary | ICD-10-CM | POA: Diagnosis present

## 2023-07-03 DIAGNOSIS — E66813 Obesity, class 3: Secondary | ICD-10-CM | POA: Diagnosis present

## 2023-07-03 DIAGNOSIS — I350 Nonrheumatic aortic (valve) stenosis: Secondary | ICD-10-CM

## 2023-07-03 DIAGNOSIS — Z953 Presence of xenogenic heart valve: Secondary | ICD-10-CM

## 2023-07-03 DIAGNOSIS — I5033 Acute on chronic diastolic (congestive) heart failure: Secondary | ICD-10-CM | POA: Diagnosis not present

## 2023-07-03 DIAGNOSIS — Z950 Presence of cardiac pacemaker: Secondary | ICD-10-CM

## 2023-07-03 DIAGNOSIS — R001 Bradycardia, unspecified: Secondary | ICD-10-CM | POA: Diagnosis present

## 2023-07-03 DIAGNOSIS — B974 Respiratory syncytial virus as the cause of diseases classified elsewhere: Secondary | ICD-10-CM | POA: Diagnosis present

## 2023-07-03 DIAGNOSIS — L409 Psoriasis, unspecified: Secondary | ICD-10-CM | POA: Diagnosis present

## 2023-07-03 DIAGNOSIS — B338 Other specified viral diseases: Principal | ICD-10-CM | POA: Insufficient documentation

## 2023-07-03 DIAGNOSIS — Z79899 Other long term (current) drug therapy: Secondary | ICD-10-CM

## 2023-07-03 DIAGNOSIS — I10 Essential (primary) hypertension: Secondary | ICD-10-CM | POA: Diagnosis present

## 2023-07-03 DIAGNOSIS — E785 Hyperlipidemia, unspecified: Secondary | ICD-10-CM | POA: Diagnosis present

## 2023-07-03 DIAGNOSIS — Z7982 Long term (current) use of aspirin: Secondary | ICD-10-CM

## 2023-07-03 DIAGNOSIS — I110 Hypertensive heart disease with heart failure: Principal | ICD-10-CM | POA: Diagnosis present

## 2023-07-03 DIAGNOSIS — Z1152 Encounter for screening for COVID-19: Secondary | ICD-10-CM

## 2023-07-03 DIAGNOSIS — Z91148 Patient's other noncompliance with medication regimen for other reason: Secondary | ICD-10-CM

## 2023-07-03 DIAGNOSIS — Z888 Allergy status to other drugs, medicaments and biological substances status: Secondary | ICD-10-CM

## 2023-07-03 DIAGNOSIS — Z6841 Body Mass Index (BMI) 40.0 and over, adult: Secondary | ICD-10-CM

## 2023-07-03 DIAGNOSIS — M159 Polyosteoarthritis, unspecified: Secondary | ICD-10-CM | POA: Diagnosis present

## 2023-07-03 DIAGNOSIS — Z952 Presence of prosthetic heart valve: Secondary | ICD-10-CM

## 2023-07-03 DIAGNOSIS — Z811 Family history of alcohol abuse and dependence: Secondary | ICD-10-CM

## 2023-07-03 DIAGNOSIS — Z96653 Presence of artificial knee joint, bilateral: Secondary | ICD-10-CM | POA: Diagnosis present

## 2023-07-03 DIAGNOSIS — E7800 Pure hypercholesterolemia, unspecified: Secondary | ICD-10-CM | POA: Diagnosis present

## 2023-07-03 DIAGNOSIS — Z8249 Family history of ischemic heart disease and other diseases of the circulatory system: Secondary | ICD-10-CM

## 2023-07-03 LAB — CBC WITH DIFFERENTIAL/PLATELET
Abs Immature Granulocytes: 0.02 10*3/uL (ref 0.00–0.07)
Basophils Absolute: 0.1 10*3/uL (ref 0.0–0.1)
Basophils Relative: 1 %
Eosinophils Absolute: 0.2 10*3/uL (ref 0.0–0.5)
Eosinophils Relative: 3 %
HCT: 39.7 % (ref 36.0–46.0)
Hemoglobin: 12.6 g/dL (ref 12.0–15.0)
Immature Granulocytes: 0 %
Lymphocytes Relative: 17 %
Lymphs Abs: 1.2 10*3/uL (ref 0.7–4.0)
MCH: 28.6 pg (ref 26.0–34.0)
MCHC: 31.7 g/dL (ref 30.0–36.0)
MCV: 90.2 fL (ref 80.0–100.0)
Monocytes Absolute: 0.7 10*3/uL (ref 0.1–1.0)
Monocytes Relative: 10 %
Neutro Abs: 4.7 10*3/uL (ref 1.7–7.7)
Neutrophils Relative %: 69 %
Platelets: 133 10*3/uL — ABNORMAL LOW (ref 150–400)
RBC: 4.4 MIL/uL (ref 3.87–5.11)
RDW: 13.5 % (ref 11.5–15.5)
WBC: 6.8 10*3/uL (ref 4.0–10.5)
nRBC: 0 % (ref 0.0–0.2)

## 2023-07-03 LAB — BASIC METABOLIC PANEL
Anion gap: 11 (ref 5–15)
BUN: 9 mg/dL (ref 8–23)
CO2: 24 mmol/L (ref 22–32)
Calcium: 8.9 mg/dL (ref 8.9–10.3)
Chloride: 105 mmol/L (ref 98–111)
Creatinine, Ser: 0.73 mg/dL (ref 0.44–1.00)
GFR, Estimated: >60 mL/min (ref >60)
Glucose, Bld: 125 mg/dL — ABNORMAL HIGH (ref 70–99)
Potassium: 3.3 mmol/L — ABNORMAL LOW (ref 3.5–5.1)
Sodium: 140 mmol/L (ref 135–145)

## 2023-07-03 LAB — RESP PANEL BY RT-PCR (RSV, FLU A&B, COVID)  RVPGX2
Influenza A by PCR: NEGATIVE
Influenza B by PCR: NEGATIVE
Resp Syncytial Virus by PCR: POSITIVE — AB
SARS Coronavirus 2 by RT PCR: NEGATIVE

## 2023-07-03 LAB — I-STAT CHEM 8, ED
BUN: 9 mg/dL (ref 8–23)
Calcium, Ion: 1.11 mmol/L — ABNORMAL LOW (ref 1.15–1.40)
Chloride: 105 mmol/L (ref 98–111)
Creatinine, Ser: 0.7 mg/dL (ref 0.44–1.00)
Glucose, Bld: 122 mg/dL — ABNORMAL HIGH (ref 70–99)
HCT: 38 % (ref 36.0–46.0)
Hemoglobin: 12.9 g/dL (ref 12.0–15.0)
Potassium: 3.2 mmol/L — ABNORMAL LOW (ref 3.5–5.1)
Sodium: 141 mmol/L (ref 135–145)
TCO2: 24 mmol/L (ref 22–32)

## 2023-07-03 LAB — BRAIN NATRIURETIC PEPTIDE: B Natriuretic Peptide: 35.7 pg/mL (ref 0.0–100.0)

## 2023-07-03 LAB — TROPONIN I (HIGH SENSITIVITY)
Troponin I (High Sensitivity): 7 ng/L (ref <18)
Troponin I (High Sensitivity): 8 ng/L (ref <18)

## 2023-07-03 LAB — MAGNESIUM: Magnesium: 1.8 mg/dL (ref 1.7–2.4)

## 2023-07-03 MED ORDER — CLOBETASOL PROP EMOLLIENT BASE 0.05 % EX CREA: 1.0000 | TOPICAL_CREAM | Freq: Two times a day (BID) | CUTANEOUS | Status: DC | PRN

## 2023-07-03 MED ORDER — ACETAMINOPHEN 650 MG RE SUPP: 650.0000 mg | Freq: Four times a day (QID) | RECTAL | Status: DC | PRN

## 2023-07-03 MED ORDER — FUROSEMIDE 10 MG/ML IJ SOLN
40.0000 mg | Freq: Two times a day (BID) | INTRAMUSCULAR | Status: DC
  Administered 2023-07-04 – 2023-07-05 (×3): 40 mg via INTRAVENOUS
  Filled 2023-07-03 (×4): qty 4

## 2023-07-03 MED ORDER — FUROSEMIDE 10 MG/ML IJ SOLN
40.0000 mg | Freq: Once | INTRAMUSCULAR | Status: AC
  Administered 2023-07-03: 40 mg via INTRAVENOUS

## 2023-07-03 MED ORDER — AMLODIPINE BESYLATE 5 MG PO TABS
5.0000 mg | ORAL_TABLET | Freq: Every day | ORAL | Status: DC
  Administered 2023-07-04 – 2023-07-06 (×3): 5 mg via ORAL
  Filled 2023-07-03 (×3): qty 1

## 2023-07-03 MED ORDER — IPRATROPIUM-ALBUTEROL 0.5-2.5 (3) MG/3ML IN SOLN
3.0000 mL | RESPIRATORY_TRACT | Status: DC | PRN
  Administered 2023-07-03 – 2023-07-06 (×5): 3 mL via RESPIRATORY_TRACT
  Filled 2023-07-03 (×6): qty 3

## 2023-07-03 MED ORDER — METOPROLOL SUCCINATE ER 25 MG PO TB24
12.5000 mg | ORAL_TABLET | Freq: Every day | ORAL | Status: DC
  Administered 2023-07-03 – 2023-07-05 (×3): 12.5 mg via ORAL
  Filled 2023-07-03 (×3): qty 1

## 2023-07-03 MED ORDER — FUROSEMIDE 10 MG/ML IJ SOLN
40.0000 mg | Freq: Once | INTRAMUSCULAR | Status: DC
  Filled 2023-07-03: qty 4

## 2023-07-03 MED ORDER — SODIUM CHLORIDE 0.9% FLUSH
3.0000 mL | Freq: Two times a day (BID) | INTRAVENOUS | Status: DC
  Administered 2023-07-03 – 2023-07-05 (×5): 3 mL via INTRAVENOUS

## 2023-07-03 MED ORDER — POTASSIUM CHLORIDE CRYS ER 20 MEQ PO TBCR
40.0000 meq | EXTENDED_RELEASE_TABLET | Freq: Once | ORAL | Status: AC
  Administered 2023-07-03: 40 meq via ORAL
  Filled 2023-07-03: qty 2

## 2023-07-03 MED ORDER — ENOXAPARIN SODIUM 40 MG/0.4ML IJ SOSY
40.0000 mg | PREFILLED_SYRINGE | INTRAMUSCULAR | Status: DC
  Administered 2023-07-03 – 2023-07-06 (×4): 40 mg via SUBCUTANEOUS
  Filled 2023-07-03 (×4): qty 0.4

## 2023-07-03 MED ORDER — POLYETHYLENE GLYCOL 3350 17 G PO PACK: 17.0000 g | PACK | Freq: Every day | ORAL | Status: DC | PRN

## 2023-07-03 MED ORDER — ACETAMINOPHEN 325 MG PO TABS: 650.0000 mg | ORAL_TABLET | Freq: Four times a day (QID) | ORAL | Status: DC | PRN

## 2023-07-03 MED ORDER — METHYLPREDNISOLONE SODIUM SUCC 125 MG IJ SOLR
125.0000 mg | Freq: Once | INTRAMUSCULAR | Status: AC
  Administered 2023-07-03: 125 mg via INTRAVENOUS
  Filled 2023-07-03: qty 2

## 2023-07-03 NOTE — Telephone Encounter (Signed)
Patient came in office to schedule an appointment. Was complaining of not being able to breath. EMS was called the proper protocol was taken and patient left with EMS. Husband was contacted and so was her job.

## 2023-07-03 NOTE — H&P (Signed)
History and Physical   Charlene Tapia:[id number] DOB: 1950/07/23 DOA: 07/03/2023  PCP: Elenore Paddy, NP   Patient coming from: Home/PCP  Chief Complaint: Shortness of breath  HPI: Charlene Tapia is a 73 y.o. female with medical history significant of hypertension, hyperlipidemia, aortic stenosis status post TAVR, symptomatic bradycardia status post pacemaker, chronic diastolic CHF, psoriasis, obesity presenting with shortness of breath.  Patient was on her way to work this morning and had sudden increased shortness of breath and was driving by her PCP office and went there for possible walk-in appointment.  She was significantly short of breath and appeared to be in respiratory distress so patient was transferred to the ED via EMS instead.  Was saturating in the 80s on room air for EMS with significant wheezing and was placed on supplemental oxygen and received DuoNebs and route.  Had some improvement by the time she arrived to the ED.  Reports that she takes Lasix as needed and has not had in several days.  Reports increasing lower extremity edema at nighttime.  As well.  Denies fever, chills, chest pain, abdominal pain, constipation, diarrhea, nausea, vomiting.  ED Course: Vital signs in the ED notable blood pressure in the 140 systolic, respirate in the teens to 20s.  Lab workup included BMP with potassium 3.3, glucose 125.  CBC with platelets 133.  Troponin negative x 2.  BNP pending.  Respiratory panel positive for RSV.  Chest x-ray with mild cardiomegaly and pulmonary vascular congestion.  Patient received Lasix and Solu-Medrol in the ED.  Review of Systems: As per HPI otherwise all other systems reviewed and are negative.  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 Lynnette Caffey and Dr. Leafy Ro   Severe aortic stenosis     Past Surgical History:  Procedure Laterality Date   GANGLION CYST EXCISION Right 01/18/1989   INGUINAL HERNIA REPAIR Bilateral 1954   "age 58 month"   INTRAOPERATIVE TRANSTHORACIC ECHOCARDIOGRAM N/A 02/04/2023   Procedure: INTRAOPERATIVE TRANSTHORACIC ECHOCARDIOGRAM;  Surgeon: Orbie Pyo, MD;  Location: [facility] INVASIVE CV LAB;  Service: Open Heart Surgery;  Laterality: N/A;   JOINT REPLACEMENT     PACEMAKER IMPLANT N/A 02/18/2023   Procedure: PACEMAKER IMPLANT;  Surgeon: [name], MD;  Location: [facility] INVASIVE CV LAB;  Service: Cardiovascular;  Laterality: N/A;   RIGHT HEART CATH AND CORONARY ANGIOGRAPHY N/A 11/25/2022   Procedure: RIGHT HEART CATH AND CORONARY ANGIOGRAPHY;  Surgeon: Orbie Pyo, MD;  Location: [facility] INVASIVE CV LAB;  Service: Cardiovascular;  Laterality: N/A;   TEMPORARY PACEMAKER N/A 02/17/2023   Procedure: TEMPORARY PACEMAKER;  Surgeon: Yvonne Kendall, MD;  Location: [facility] INVASIVE CV LAB;  Service: Cardiovascular;  Laterality: N/A;   TOTAL KNEE ARTHROPLASTY Left 08/16/2013   Procedure: TOTAL KNEE ARTHROPLASTY;  Surgeon: Nestor Lewandowsky, MD;  Location: [facility] OR;  Service: Orthopedics;  Laterality: Left;   TOTAL KNEE ARTHROPLASTY Right 09/29/2013   TOTAL KNEE ARTHROPLASTY Right 09/29/2013   Procedure: TOTAL KNEE ARTHROPLASTY;  Surgeon: Nestor Lewandowsky, MD;  Location: [facility] OR;  Service: Orthopedics;  Laterality: Right;   TRANSCATHETER AORTIC VALVE REPLACEMENT, TRANSFEMORAL N/A 02/04/2023   Procedure: Transcatheter Aortic Valve Replacement, Transfemoral;  Surgeon: Orbie Pyo, MD;  Location:  [facility] INVASIVE CV LAB;  Service: Open Heart Surgery;  Laterality: N/A;    Social History  reports that she has never smoked. She has never used smokeless tobacco. She reports current alcohol use. She reports that she does not use drugs.  Allergies  Allergen Reactions   Phisohex [Hexachlorophene] Rash     Blistering rash    Family History  Problem Relation Age of Onset   Heart disease Father    Hypertension Father    Alcohol abuse Father   Reviewed on admission  Prior to Admission medications   Medication Sig Start Date End Date Taking? Authorizing Provider  amoxicillin (AMOXIL) 500 MG capsule Take 4 capsules by mouth 1 hour prior to dental procedures and cleanings 02/10/23  Yes Georgie Chard D, NP  amLODipine (NORVASC) 5 MG tablet Take 1 tablet (5 mg total) by mouth daily. 05/08/23   Elenore Paddy, NP  aspirin EC 81 MG tablet Take 1 tablet (81 mg total) by mouth daily. Swallow whole. 02/05/23   Janetta Hora, PA-C  Clobetasol Prop Emollient Base (CLOBETASOL PROPIONATE E) 0.05 % emollient cream Apply to affected area 2 (two) times daily. Patient taking differently: Apply 1 Application topically 2 (two) times daily as needed (psoriasis). 10/18/22   Elenore Paddy, NP  fluticasone Aleda Grana) 50 MCG/ACT nasal spray Place 2 sprays into both nostrils daily as needed for allergies or rhinitis.    [provider]  furosemide (LASIX) 20 MG tablet Take 1 tablet (20 mg total) by mouth daily. 03/05/23   Georgie Chard D, NP  ibuprofen (ADVIL) 200 MG tablet Take 400 mg by mouth every 8 (eight) hours as needed for mild pain or moderate pain.    [provider]  metoprolol succinate (TOPROL XL) 25 MG 24 hr tablet Take 0.5 tablets (12.5 mg total) by mouth at bedtime. 03/05/23 03/04/24  Filbert Schilder, NP  Multiple Vitamins-Minerals (MULTIVITAMIN GUMMIES WOMENS PO) Take 2 each by mouth daily after supper.    [provider]    Physical Exam: Vitals:   07/03/23 1009 07/03/23 1320  BP: (!) 144/54 (!) 142/60  Pulse: 82 67  Resp: 16 (!) 24  Temp: 98.1 F (36.7 C)   SpO2: 96% 95%    Physical Exam Constitutional:      General: She is not in acute distress.    Appearance: Normal appearance. She is obese.  HENT:     Head: Normocephalic and atraumatic.     Mouth/Throat:      Mouth: Mucous membranes are moist.     Pharynx: Oropharynx is clear.  Eyes:     Extraocular Movements: Extraocular movements intact.     Pupils: Pupils are equal, round, and reactive to light.  Cardiovascular:     Rate and Rhythm: Normal rate and regular rhythm.     Pulses: Normal pulses.     Heart sounds: Normal heart sounds.  Pulmonary:     Effort: Pulmonary effort is normal. No respiratory distress.     Breath sounds: Normal breath sounds.  Abdominal:     General: Bowel sounds are normal. There is no distension.     Palpations: Abdomen is soft.     Tenderness: There is no abdominal tenderness.  Musculoskeletal:        General: No swelling or deformity.     Right lower leg: Edema present.     Left lower leg: Edema present.  Skin:    General: Skin is warm and dry.  Neurological:  General: No focal deficit present.     Mental Status: Mental status is at baseline.    Labs on Admission: I have personally reviewed following labs and imaging studies  CBC: Recent Labs  Lab 07/03/23 0947 07/03/23 1041  WBC 6.8  --   NEUTROABS 4.7  --   HGB 12.6 12.9  HCT 39.7 38.0  MCV 90.2  --   PLT 133*  --     Basic Metabolic Panel: Recent Labs  Lab 07/03/23 0947 07/03/23 1041  NA 140 141  K 3.3* 3.2*  CL 105 105  CO2 24  --   GLUCOSE 125* 122*  BUN 9 9  CREATININE 0.73 0.70  CALCIUM 8.9  --     GFR: CrCl cannot be calculated (Unknown ideal weight.).  Liver Function Tests: No results for input(s): "AST", "ALT", "ALKPHOS", "BILITOT", "PROT", "ALBUMIN" in the last 168 hours.  Urine analysis:    Component Value Date/Time   COLORURINE STRAW (A) 01/31/2023 0842   APPEARANCEUR CLEAR 01/31/2023 0842   LABSPEC 1.004 (L) 01/31/2023 0842   PHURINE 6.0 01/31/2023 0842   GLUCOSEU NEGATIVE 01/31/2023 0842   HGBUR NEGATIVE 01/31/2023 0842   BILIRUBINUR NEGATIVE 01/31/2023 0842   KETONESUR NEGATIVE 01/31/2023 0842   PROTEINUR NEGATIVE 01/31/2023 0842   UROBILINOGEN 0.2  09/22/2013 1104   NITRITE NEGATIVE 01/31/2023 0842   LEUKOCYTESUR NEGATIVE 01/31/2023 0842    Radiological Exams on Admission: DG Chest Port 1 View Result Date: 07/03/2023 CLINICAL DATA:  Shortness of breath EXAM: PORTABLE CHEST 1 VIEW COMPARISON:  02/19/2023 FINDINGS: Stable positioning of left-sided implanted cardiac device. Mild cardiomegaly. Aortic atherosclerosis. Mild pulmonary vascular congestion. No focal airspace consolidation, pleural effusion, or pneumothorax. IMPRESSION: Mild cardiomegaly with pulmonary vascular congestion. Electronically Signed   By: Duanne Guess D.O.   On: 07/03/2023 10:54   EKG: Independently reviewed.  Paced rhythm at 76 bpm.  Nonspecific T wave changes.  Assessment/Plan Principal Problem:   Acute respiratory failure with hypoxia (HCC) Active Problems:   Obesity, morbid, BMI 50 or higher (HCC)   Psoriasis   Hyperlipidemia   HTN (hypertension)   Severe aortic stenosis   S/P TAVR (transcatheter aortic valve replacement)   Acute on chronic diastolic heart failure (HCC)   Symptomatic bradycardia   RSV infection  Acute respiratory failure with hypoxia RSV Acute on chronic diastolic CHF > Last echo was in October of last year with EF 65 to 70%, indeterminate diastolic function, normal RV function. > Has not had Lasix in several days and has had worsening lower extremity edema. > Sudden worsening shortness of breath while driving to work this morning.  Stop at PCP office as it was on the way and due to her respiratory distress was transported to the ED via EMS. > Found to be hypoxic for EMS in the 80s as well as wheezing.  Received DuoNebs and supplemental oxygen with improvement and route. > In the ED hypoxia resolved and patient is now saturating well on room air.  Positive for RSV, commendation of RSV infection on mild to moderate CHF exacerbation likely led to severity of respiratory distress. > Received Lasix and Solu-Medrol in the ED. - Monitor on  telemetry overnight - Continue with as needed DuoNeb - Continue with IV Lasix at least tomorrow morning - Follow-up BNP - Check magnesium - Trend renal function and electrolytes  Aortic stenosis - Status post TAVR  Symptomatic bradycardia - Status post pacemaker  Hypertension - Continue home amlodipine, metoprolol - Lasix as above  Psoriasis - Continue as needed clobetasol emollient  DVT prophylaxis: Lovenox Code Status:   Full Family Communication:  None on admission  Disposition Plan:   Patient is from:  Home  Anticipated DC to:  Home  Anticipated DC date:  1 to 2 days  Anticipated DC barriers: None  Consults called:  None Admission status:  Observation, telemetry  Severity of Illness: The appropriate patient status for this patient is OBSERVATION. Observation status is judged to be reasonable and necessary in order to provide the required intensity of service to ensure the patient's safety. The patient's presenting symptoms, physical exam findings, and initial radiographic and laboratory data in the context of their medical condition is felt to place them at decreased risk for further clinical deterioration. Furthermore, it is anticipated that the patient will be medically stable for discharge from the hospital within 2 midnights of admission.    Synetta Fail MD [facility]  How to contact the [facility] Attending or Consulting provider 7A - 7P or covering provider during after hours 7P -7A, for this patient?   Check the care team in [facility] and look for a) attending/consulting TR[facility] provider listed and b) the [facility] team listed Log into [email or web address] and use [place]'s universal password to access. If you do not have the password, please contact the hospital operator. Locate the [facility] provider you are looking for under [facility] and page to a number that you can be directly reached. If you still have difficulty reaching the provider, please page the [facility] (Director  on Call) for the Hospitalists listed on amion for assistance.  07/03/2023, 1:55 PM

## 2023-07-03 NOTE — ED Notes (Signed)
Patient left the floor in stable condition, with her belongings and staff.

## 2023-07-03 NOTE — ED Provider Notes (Signed)
 [place] EMERGENCY DEPARTMENT AT [facility] Provider Note   CSN: [id number] Arrival date & time: 07/03/23  5188     History  Chief Complaint  Patient presents with   Shortness of Breath    Charlene Tapia is a 73 y.o. female.  Charlene Tapia is a 73 y.o. female with history of recent TAVR and pacemaker placement, HFpEF, who presents to the ED via EMS for evaluation of shortness fo breath. Pt reports she was driving to work this morning when she developed sudden worsening of her shortness fo breath. AT that time she was driving past her PCP office ans stopped there and they called. EMS reports she was in some distress upon their arrival with increased work of breathing, diminished breath sounds and hypoxia with O2 sats of 86% on room air. She received albuterol neb treatments with some improvement. Pt reports that she has had some worsening sinus congestion over the past few days, does has a history of some chronic sinus issues thought she was developing sinusitis, but then starting have more cough and some mild shortness of breath that worsened today. She also reports some increased leg swelling over the past few days. Pt reports that she checks her weight daily and had not seen a significant change so had not taken any of her PRN Lasix.   The history is provided by the patient, the spouse and the EMS personnel.  Shortness of Breath Associated symptoms: cough   Associated symptoms: no abdominal pain, no chest pain, no fever and no vomiting        Home Medications Prior to Admission medications   Medication Sig Start Date End Date Taking? Authorizing Provider  amoxicillin (AMOXIL) 500 MG capsule Take 4 capsules by mouth 1 hour prior to dental procedures and cleanings 02/10/23  Yes Georgie Chard D, NP  amLODipine (NORVASC) 5 MG tablet Take 1 tablet (5 mg total) by mouth daily. 05/08/23  Yes Elenore Paddy, NP  aspirin EC 81 MG tablet Take 1 tablet (81 mg total) by mouth  daily. Swallow whole. 02/05/23  Yes Janetta Hora, PA-C  Clobetasol Prop Emollient Base (CLOBETASOL PROPIONATE E) 0.05 % emollient cream Apply to affected area 2 (two) times daily. Patient taking differently: Apply 1 Application topically 2 (two) times daily as needed (psoriasis). 10/18/22  Yes Elenore Paddy, NP  fluticasone Aleda Grana) 50 MCG/ACT nasal spray Place 2 sprays into both nostrils daily as needed for allergies or rhinitis.   Yes [provider]  furosemide (LASIX) 20 MG tablet Take 1 tablet (20 mg total) by mouth daily. 03/05/23  Yes Georgie Chard D, NP  ibuprofen (ADVIL) 200 MG tablet Take 400 mg by mouth every 8 (eight) hours as needed for mild pain or moderate pain.   Yes [provider]  metoprolol succinate (TOPROL XL) 25 MG 24 hr tablet Take 0.5 tablets (12.5 mg total) by mouth at bedtime. 03/05/23 03/04/24 Yes Georgie Chard D, NP  Multiple Vitamins-Minerals (MULTIVITAMIN GUMMIES WOMENS PO) Take 2 each by mouth daily after supper.   Yes [provider]      Allergies    Phisohex [hexachlorophene]    Review of Systems   Review of Systems  Constitutional:  Negative for chills and fever.  HENT:  Positive for congestion, rhinorrhea and sinus pressure.   Respiratory:  Positive for cough and shortness of breath.   Cardiovascular:  Positive for leg swelling. Negative for chest pain.  Gastrointestinal:  Negative for abdominal  pain, diarrhea, nausea and vomiting.  Neurological:  Negative for syncope.    Physical Exam Updated Vital Signs BP (!) 148/70   Pulse 78   Temp 98.3 F (36.8 C)   Resp 20   SpO2 96%  Physical Exam Vitals and nursing note reviewed.  Constitutional:      General: She is not in acute distress.    Appearance: Normal appearance. She is well-developed. She is obese. She is ill-appearing. She is not diaphoretic.  HENT:     Head: Normocephalic and atraumatic.     Nose: Congestion present.     Right Sinus: Maxillary sinus  tenderness present.     Left Sinus: Maxillary sinus tenderness present.  Eyes:     General:        Right eye: No discharge.        Left eye: No discharge.     Pupils: Pupils are equal, round, and reactive to light.  Cardiovascular:     Rate and Rhythm: Normal rate and regular rhythm.     Pulses: Normal pulses.     Heart sounds: Normal heart sounds.  Pulmonary:     Effort: Tachypnea and respiratory distress present.     Breath sounds: Decreased breath sounds present. No wheezing or rales.     Comments: Pt with tachypnea and increased respiratory effort with some decreased air movement, mild respiratory distress.  Abdominal:     General: Bowel sounds are normal. There is no distension.     Palpations: Abdomen is soft. There is no mass.     Tenderness: There is no abdominal tenderness. There is no guarding.     Comments: Abdomen soft, nondistended, nontender to palpation in all quadrants without guarding or peritoneal signs  Musculoskeletal:        General: No deformity.     Cervical back: Neck supple.     Right lower leg: Edema present.     Left lower leg: Edema present.     Comments: 2+ pitting edema in bilateral lower extremities to the upper shin  Skin:    General: Skin is warm and dry.     Capillary Refill: Capillary refill takes less than 2 seconds.  Neurological:     Mental Status: She is alert and oriented to person, place, and time.     Coordination: Coordination normal.     Comments: Speech is clear, able to follow commands Moves extremities without ataxia, coordination intact  Psychiatric:        Mood and Affect: Mood normal.        Behavior: Behavior normal.     ED Results / Procedures / Treatments   Labs (all labs ordered are listed, but only abnormal results are displayed) Labs Reviewed  RESP PANEL BY RT-PCR (RSV, FLU A&B, COVID)  RVPGX2 - Abnormal; Notable for the following components:      Result Value   Resp Syncytial Virus by PCR POSITIVE (*)    All other  components within normal limits  BASIC METABOLIC PANEL - Abnormal; Notable for the following components:   Potassium 3.3 (*)    Glucose, Bld 125 (*)    All other components within normal limits  CBC WITH DIFFERENTIAL/PLATELET - Abnormal; Notable for the following components:   Platelets 133 (*)    All other components within normal limits  I-STAT CHEM 8, ED - Abnormal; Notable for the following components:   Potassium 3.2 (*)    Glucose, Bld 122 (*)    Calcium, Ion 1.11 (*)  All other components within normal limits  BRAIN NATRIURETIC PEPTIDE  MAGNESIUM  TROPONIN I (HIGH SENSITIVITY)  TROPONIN I (HIGH SENSITIVITY)    EKG EKG Interpretation Date/Time:  Thursday July 03 2023 10:11:18 EST Ventricular Rate:  76 PR Interval:  200 QRS Duration:  116 QT Interval:  411 QTC Calculation: 463 R Axis:   -53  Text Interpretation: ventricular paced rhythm  Consider left atrial enlargement   No significant change since last tracing  Confirmed by Alvira Monday ([id number]) on 07/03/2023 11:38:28 AM  Radiology DG Chest Port 1 View Result Date: 07/03/2023 CLINICAL DATA:  Shortness of breath EXAM: PORTABLE CHEST 1 VIEW COMPARISON:  02/19/2023 FINDINGS: Stable positioning of left-sided implanted cardiac device. Mild cardiomegaly. Aortic atherosclerosis. Mild pulmonary vascular congestion. No focal airspace consolidation, pleural effusion, or pneumothorax. IMPRESSION: Mild cardiomegaly with pulmonary vascular congestion. Electronically Signed   By: Duanne Guess D.O.   On: 07/03/2023 10:54    Procedures Procedures    Medications Ordered in ED Medications  ipratropium-albuterol (DUONEB) 0.5-2.5 (3) MG/3ML nebulizer solution 3 mL (has no administration in time range)  methylPREDNISolone sodium succinate (SOLU-MEDROL) 125 mg/2 mL injection 125 mg (125 mg Intravenous Given 07/03/23 1205)  furosemide (LASIX) injection 40 mg (40 mg Intravenous Given 07/03/23 1206)  potassium chloride SA  (KLOR-CON M) CR tablet 40 mEq (40 mEq Oral Given 07/03/23 1427)    ED Course/ Medical Decision Making/ A&P  73 y.o. female presents to the ED with complaints of shortness of breath, this involves an extensive number of treatment options, and is a complaint that carries with it a high risk of complications and morbidity.  The differential diagnosis includes pneumonia, flu/COVID/ RSV, CHF, asthma, COPD,   On arrival pt is in mild respiratory distress. Hypoxic with EMS, but this has improved after neb treatments, no longer requiring O2, but still some increased work of breathing.   Additional history obtained from EMS and spouse. Previous records obtained and reviewed   Lab Tests:  I Ordered, reviewed, and interpreted labs, which included: No leukocytosis, mild hypokalemia, normal Cr, no other electrolyte derangements. + RSV testing. Trops and BNP WNL  Imaging Studies ordered:  I ordered imaging studies which included CXR, I independently visualized and interpreted imaging which showed mild cardiomegaly and vascular congestion  ED Course:   Pt with respiratory distress in setting of RSV infection and likely acute on chronic CHF exacerbation. Given hypoxia and distress on initial presentation feel pt would benefit from admission and continued observation. Pt was responsive to bronchodialators and received solumedrol and lasix.   I consulted Dr. Alinda Money w/ [facility] and discussed lab and imaging findings  Portions of this note were generated with Dragon dictation software. Dictation errors may occur despite best attempts at proofreading.        Final Clinical Impression(s) / ED Diagnoses Final diagnoses:  RSV (respiratory syncytial virus infection)  Acute respiratory failure with hypoxia [facility])    Rx / DC Orders ED Discharge Orders     None         Rosezella Rumpf, PA-C 07/09/23 1404    Alvira Monday, MD 07/13/23 [phone]

## 2023-07-03 NOTE — ED Triage Notes (Signed)
PT BIB EMS for shortness of breath, was on her way to work and become short of breath, hasn't had her Lasix in several days, swelling noted to BLE.   2 duo nebs  190/134 89 HR 30 Resp 18g Right AC CBG 99

## 2023-07-03 NOTE — Plan of Care (Signed)

## 2023-07-04 DIAGNOSIS — Z950 Presence of cardiac pacemaker: Secondary | ICD-10-CM | POA: Diagnosis not present

## 2023-07-04 DIAGNOSIS — Z953 Presence of xenogenic heart valve: Secondary | ICD-10-CM | POA: Diagnosis not present

## 2023-07-04 DIAGNOSIS — Z79899 Other long term (current) drug therapy: Secondary | ICD-10-CM | POA: Diagnosis not present

## 2023-07-04 DIAGNOSIS — I5033 Acute on chronic diastolic (congestive) heart failure: Secondary | ICD-10-CM | POA: Diagnosis present

## 2023-07-04 DIAGNOSIS — E7800 Pure hypercholesterolemia, unspecified: Secondary | ICD-10-CM | POA: Diagnosis present

## 2023-07-04 DIAGNOSIS — L409 Psoriasis, unspecified: Secondary | ICD-10-CM | POA: Diagnosis present

## 2023-07-04 DIAGNOSIS — Z888 Allergy status to other drugs, medicaments and biological substances status: Secondary | ICD-10-CM | POA: Diagnosis not present

## 2023-07-04 DIAGNOSIS — I350 Nonrheumatic aortic (valve) stenosis: Secondary | ICD-10-CM | POA: Diagnosis present

## 2023-07-04 DIAGNOSIS — M159 Polyosteoarthritis, unspecified: Secondary | ICD-10-CM | POA: Diagnosis present

## 2023-07-04 DIAGNOSIS — Z8249 Family history of ischemic heart disease and other diseases of the circulatory system: Secondary | ICD-10-CM | POA: Diagnosis not present

## 2023-07-04 DIAGNOSIS — J9601 Acute respiratory failure with hypoxia: Secondary | ICD-10-CM | POA: Diagnosis present

## 2023-07-04 DIAGNOSIS — B974 Respiratory syncytial virus as the cause of diseases classified elsewhere: Secondary | ICD-10-CM | POA: Diagnosis present

## 2023-07-04 DIAGNOSIS — E66813 Obesity, class 3: Secondary | ICD-10-CM | POA: Diagnosis present

## 2023-07-04 DIAGNOSIS — Z811 Family history of alcohol abuse and dependence: Secondary | ICD-10-CM | POA: Diagnosis not present

## 2023-07-04 DIAGNOSIS — Z1152 Encounter for screening for COVID-19: Secondary | ICD-10-CM | POA: Diagnosis not present

## 2023-07-04 DIAGNOSIS — Z91148 Patient's other noncompliance with medication regimen for other reason: Secondary | ICD-10-CM | POA: Diagnosis not present

## 2023-07-04 DIAGNOSIS — Z6841 Body Mass Index (BMI) 40.0 and over, adult: Secondary | ICD-10-CM | POA: Diagnosis not present

## 2023-07-04 DIAGNOSIS — Z7982 Long term (current) use of aspirin: Secondary | ICD-10-CM | POA: Diagnosis not present

## 2023-07-04 DIAGNOSIS — I110 Hypertensive heart disease with heart failure: Secondary | ICD-10-CM | POA: Diagnosis present

## 2023-07-04 DIAGNOSIS — Z96653 Presence of artificial knee joint, bilateral: Secondary | ICD-10-CM | POA: Diagnosis present

## 2023-07-04 DIAGNOSIS — R001 Bradycardia, unspecified: Secondary | ICD-10-CM | POA: Diagnosis present

## 2023-07-04 LAB — CBC
HCT: 42 % (ref 36.0–46.0)
Hemoglobin: 13.1 g/dL (ref 12.0–15.0)
MCH: 28.2 pg (ref 26.0–34.0)
MCHC: 31.2 g/dL (ref 30.0–36.0)
MCV: 90.5 fL (ref 80.0–100.0)
Platelets: 160 10*3/uL (ref 150–400)
RBC: 4.64 MIL/uL (ref 3.87–5.11)
RDW: 14.1 % (ref 11.5–15.5)
WBC: 8 10*3/uL (ref 4.0–10.5)
nRBC: 0 % (ref 0.0–0.2)

## 2023-07-04 LAB — COMPREHENSIVE METABOLIC PANEL
ALT: 18 U/L (ref 0–44)
AST: 16 U/L (ref 15–41)
Albumin: 3.5 g/dL (ref 3.5–5.0)
Alkaline Phosphatase: 37 U/L — ABNORMAL LOW (ref 38–126)
Anion gap: 10 (ref 5–15)
BUN: 14 mg/dL (ref 8–23)
CO2: 27 mmol/L (ref 22–32)
Calcium: 9.2 mg/dL (ref 8.9–10.3)
Chloride: 102 mmol/L (ref 98–111)
Creatinine, Ser: 0.7 mg/dL (ref 0.44–1.00)
GFR, Estimated: >60 mL/min (ref >60)
Glucose, Bld: 96 mg/dL (ref 70–99)
Potassium: 4.5 mmol/L (ref 3.5–5.1)
Sodium: 139 mmol/L (ref 135–145)
Total Bilirubin: 0.9 mg/dL (ref 0.0–1.2)
Total Protein: 6.3 g/dL — ABNORMAL LOW (ref 6.5–8.1)

## 2023-07-04 MED ORDER — GUAIFENESIN-CODEINE 100-10 MG/5ML PO SOLN
5.0000 mL | Freq: Four times a day (QID) | ORAL | Status: DC | PRN
  Administered 2023-07-04 – 2023-07-05 (×2): 5 mL via ORAL
  Filled 2023-07-04 (×2): qty 5

## 2023-07-04 NOTE — Progress Notes (Signed)
PROGRESS NOTE    Charlene Tapia  MVH:[id number] DOB: 16-Nov-1950 DOA: 07/03/2023 PCP: Elenore Paddy, NP  Outpatient Specialists:     Brief Narrative:  Patient is a 73 year old female, morbidly obese, past medical history significant for  hypertension, hyperlipidemia, aortic stenosis status post TAVR, symptomatic bradycardia status post pacemaker, chronic diastolic CHF, psoriasis, and obesity.  Patient was admitted with shortness of breath.  Workup has revealed acute respiratory failure with hypoxia, RSV infection and acute on chronic diastolic CHF.  07/04/2023: Patient is slowly improving, but still feels very unwell.  Will continue diuresis.  Supportive care.  Assessment & Plan:   Principal Problem:   Acute respiratory failure with hypoxia (HCC) Active Problems:   Obesity, morbid, BMI 50 or higher (HCC)   Psoriasis   Hyperlipidemia   HTN (hypertension)   Severe aortic stenosis   S/P TAVR (transcatheter aortic valve replacement)   Acute on chronic diastolic heart failure (HCC)   Symptomatic bradycardia   RSV infection   Acute on chronic diastolic CHF (congestive heart failure) (HCC)   Acute respiratory failure with hypoxia RSV Acute on chronic diastolic CHF -Last echo was in October of last year with EF 65 to 70%, indeterminate diastolic function, normal RV function. -Patient has not had Lasix in several days and has had worsening lower extremity edema. -Sudden worsening shortness of breath while driving to work this morning.  Stop at PCP office as it was on the way and due to her respiratory distress was transported to the ED via EMS. -Found to be hypoxic for EMS in the 80s as well as wheezing.  Received DuoNebs and supplemental oxygen with improvement and route. -In the ED hypoxia resolved and patient is now saturating well on room air.  Positive for RSV, commendation of RSV infection on mild to moderate CHF exacerbation likely led to severity of respiratory  distress. -Continue IV Lasix 40 Mg twice daily.   -Continue supportive care. - Continue with as needed DuoNeb -Follow renal function and electrolytes.    Aortic stenosis - Status post TAVR   Symptomatic bradycardia - Status post pacemaker   Hypertension - Continue home amlodipine, metoprolol - Lasix as above   Psoriasis - Continue as needed clobetasol emollient   DVT prophylaxis: Subcutaneous Lovenox. Code Status: Full code. Family Communication:  Disposition Plan: Patient remains inpatient.  Multiple and severe comorbidities.   Consultants:  None.  Procedures:  None.  Antimicrobials:  None.   Subjective: Patient continues to feel unwell.  Objective: Vitals:   07/04/23 0434 07/04/23 0755 07/04/23 0953 07/04/23 1150  BP: (!) 137/57 (!) 126/58 117/61 (!) 132/51  Pulse: 64 71  71  Resp: 17 18  18   Temp: 98 F (36.7 C) 97.8 F (36.6 C)  98.5 F (36.9 C)  TempSrc:      SpO2: 98% 96%  97%    Intake/Output Summary (Last 24 hours) at 07/04/2023 1854 Last data filed at 07/04/2023 0755 Gross per 24 hour  Intake 300 ml  Output 250 ml  Net 50 ml   There were no vitals filed for this visit.  Examination:  General exam: Patient is awake and alert.  Patient is morbidly obese. Respiratory system: Clear to auscultation.  Cardiovascular system: S1 & S2 heard Gastrointestinal system: Abdomen is obese, soft and nontender.   Central nervous system: Awake and alert.   Extremities: Bilateral lower extremity edema. Data Reviewed: I have personally reviewed following labs and imaging studies  CBC: Recent Labs  Lab 07/03/23 0947  07/03/23 1041 07/04/23 0753  WBC 6.8  --  8.0  NEUTROABS 4.7  --   --   HGB 12.6 12.9 13.1  HCT 39.7 38.0 42.0  MCV 90.2  --  90.5  PLT 133*  --  160   Basic Metabolic Panel: Recent Labs  Lab 07/03/23 0947 07/03/23 1041 07/03/23 1147 07/04/23 0753  NA 140 141  --  139  K 3.3* 3.2*  --  4.5  CL 105 105  --  102  CO2 24  --    --  27  GLUCOSE 125* 122*  --  96  BUN 9 9  --  14  CREATININE 0.73 0.70  --  0.70  CALCIUM 8.9  --   --  9.2  MG  --   --  1.8  --    GFR: CrCl cannot be calculated (Unknown ideal weight.). Liver Function Tests: Recent Labs  Lab 07/04/23 0753  AST 16  ALT 18  ALKPHOS 37*  BILITOT 0.9  PROT 6.3*  ALBUMIN 3.5   No results for input(s): "LIPASE", "AMYLASE" in the last 168 hours. No results for input(s): "AMMONIA" in the last 168 hours. Coagulation Profile: No results for input(s): "INR", "PROTIME" in the last 168 hours. Cardiac Enzymes: No results for input(s): "CKTOTAL", "CKMB", "CKMBINDEX", "TROPONINI" in the last 168 hours. BNP (last 3 results) No results for input(s): "PROBNP" in the last 8760 hours. HbA1C: No results for input(s): "HGBA1C" in the last 72 hours. CBG: No results for input(s): "GLUCAP" in the last 168 hours. Lipid Profile: No results for input(s): "CHOL", "HDL", "LDLCALC", "TRIG", "CHOLHDL", "LDLDIRECT" in the last 72 hours. Thyroid Function Tests: No results for input(s): "TSH", "T4TOTAL", "FREET4", "T3FREE", "THYROIDAB" in the last 72 hours. Anemia Panel: No results for input(s): "VITAMINB12", "FOLATE", "FERRITIN", "TIBC", "IRON", "RETICCTPCT" in the last 72 hours. Urine analysis:    Component Value Date/Time   COLORURINE STRAW (A) 01/31/2023 0842   APPEARANCEUR CLEAR 01/31/2023 0842   LABSPEC 1.004 (L) 01/31/2023 0842   PHURINE 6.0 01/31/2023 0842   GLUCOSEU NEGATIVE 01/31/2023 0842   HGBUR NEGATIVE 01/31/2023 0842   BILIRUBINUR NEGATIVE 01/31/2023 0842   KETONESUR NEGATIVE 01/31/2023 0842   PROTEINUR NEGATIVE 01/31/2023 0842   UROBILINOGEN 0.2 09/22/2013 1104   NITRITE NEGATIVE 01/31/2023 0842   LEUKOCYTESUR NEGATIVE 01/31/2023 0842   Sepsis Labs: @LABRCNTIP (procalcitonin:4,lacticidven:4)  ) Recent Results (from the past 240 hours)  Resp panel by RT-PCR (RSV, Flu A&B, Covid) Anterior Nasal Swab     Status: Abnormal   Collection Time:  07/03/23  9:47 AM   Specimen: Anterior Nasal Swab  Result Value Ref Range Status   SARS Coronavirus 2 by RT PCR NEGATIVE NEGATIVE Final   Influenza A by PCR NEGATIVE NEGATIVE Final   Influenza B by PCR NEGATIVE NEGATIVE Final    Comment: (NOTE) The Xpert Xpress SARS-CoV-2/FLU/RSV plus assay is intended as an aid in the diagnosis of influenza from Nasopharyngeal swab specimens and should not be used as a sole basis for treatment. Nasal washings and aspirates are unacceptable for Xpert Xpress SARS-CoV-2/FLU/RSV testing.  Fact Sheet for Patients: BloggerCourse.com  Fact Sheet for Healthcare Providers: SeriousBroker.it  This test is not yet approved or cleared by the [place] FDA and has been authorized for detection and/or diagnosis of SARS-CoV-2 by FDA under an Emergency Use Authorization (EUA). This EUA will remain in effect (meaning this test can be used) for the duration of the COVID-19 declaration under Section 564(b)(1) of the Act, 21 U.S.C.  section 360bbb-3(b)(1), unless the authorization is terminated or revoked.     Resp Syncytial Virus by PCR POSITIVE (A) NEGATIVE Final    Comment: (NOTE) Fact Sheet for Patients: BloggerCourse.com  Fact Sheet for Healthcare Providers: SeriousBroker.it  This test is not yet approved or cleared by the [place] FDA and has been authorized for detection and/or diagnosis of SARS-CoV-2 by FDA under an Emergency Use Authorization (EUA). This EUA will remain in effect (meaning this test can be used) for the duration of the COVID-19 declaration under Section 564(b)(1) of the Act, 21 U.S.C. section 360bbb-3(b)(1), unless the authorization is terminated or revoked.  Performed at [facility], [address], [place], [place] [address]          Radiology Studies: DG Chest Port 1 View Result Date: 07/03/2023 CLINICAL DATA:   Shortness of breath EXAM: PORTABLE CHEST 1 VIEW COMPARISON:  02/19/2023 FINDINGS: Stable positioning of left-sided implanted cardiac device. Mild cardiomegaly. Aortic atherosclerosis. Mild pulmonary vascular congestion. No focal airspace consolidation, pleural effusion, or pneumothorax. IMPRESSION: Mild cardiomegaly with pulmonary vascular congestion. Electronically Signed   By: Duanne Guess D.O.   On: 07/03/2023 10:54        Scheduled Meds:  amLODipine  5 mg Oral Daily   enoxaparin (LOVENOX) injection  40 mg Subcutaneous Q24H   furosemide  40 mg Intravenous BID   metoprolol succinate  12.5 mg Oral QHS   sodium chloride flush  3 mL Intravenous Q12H   Continuous Infusions:   LOS: 0 days    Time spent: 55 minutes.    Berton Mount, MD  [facility] Pager #: [phone] 7PM-7AM contact night coverage as above

## 2023-07-04 NOTE — Plan of Care (Signed)

## 2023-07-04 NOTE — Care Management Obs Status (Cosign Needed)
MEDICARE OBSERVATION STATUS NOTIFICATION   Patient Details  Name: Charlene Tapia MRN: [id number] Date of Birth: 14-Feb-1951   Medicare Observation Status Notification Given:  Yes    Janae Bridgeman, RN 07/04/2023, 2:45 PM

## 2023-07-04 NOTE — Progress Notes (Signed)
Transition of Care [facility]) - Inpatient Brief Assessment   Patient Details  Name: Charlene Tapia MRN: [id number] Date of Birth: 12-30-1950  Transition of Care [facility]) CM/SW Contact:    Janae Bridgeman, RN Phone Number: 07/04/2023, 2:49 PM   Clinical Narrative: CM met with the patient at the bedside to provide [facility] letter.  Patient states that she lives with her husband at the home and plans to return home when stable.  Patient is normally on room air at home.  Patient is being diuresed at this time and should likely discharge over the weekend.  No other TOC needs at this time.   Transition of Care Asessment: Insurance and Status: (P) Insurance coverage has been reviewed Patient has primary care physician: (P) Yes Home environment has been reviewed: (P) from home with spouse Prior level of function:: (P) Independent Prior/Current Home Services: (P) No current home services Social Drivers of Health Review: (P) SDOH reviewed interventions complete Readmission risk has been reviewed: (P) Yes Transition of care needs: (P) transition of care needs identified, TOC will continue to follow

## 2023-07-05 ENCOUNTER — Other Ambulatory Visit (HOSPITAL_COMMUNITY): Payer: Self-pay

## 2023-07-05 ENCOUNTER — Inpatient Hospital Stay (HOSPITAL_COMMUNITY): Payer: Medicare Other

## 2023-07-05 DIAGNOSIS — J9601 Acute respiratory failure with hypoxia: Secondary | ICD-10-CM | POA: Diagnosis not present

## 2023-07-05 LAB — RENAL FUNCTION PANEL
Albumin: 3.5 g/dL (ref 3.5–5.0)
Anion gap: 12 (ref 5–15)
BUN: 20 mg/dL (ref 8–23)
CO2: 23 mmol/L (ref 22–32)
Calcium: 9.2 mg/dL (ref 8.9–10.3)
Chloride: 102 mmol/L (ref 98–111)
Creatinine, Ser: 0.73 mg/dL (ref 0.44–1.00)
GFR, Estimated: >60 mL/min (ref >60)
Glucose, Bld: 105 mg/dL — ABNORMAL HIGH (ref 70–99)
Phosphorus: 4.4 mg/dL (ref 2.5–4.6)
Potassium: 4.2 mmol/L (ref 3.5–5.1)
Sodium: 137 mmol/L (ref 135–145)

## 2023-07-05 LAB — CBC WITH DIFFERENTIAL/PLATELET
Abs Immature Granulocytes: 0.03 10*3/uL (ref 0.00–0.07)
Basophils Absolute: 0.1 10*3/uL (ref 0.0–0.1)
Basophils Relative: 1 %
Eosinophils Absolute: 0.2 10*3/uL (ref 0.0–0.5)
Eosinophils Relative: 2 %
HCT: 42 % (ref 36.0–46.0)
Hemoglobin: 13.6 g/dL (ref 12.0–15.0)
Immature Granulocytes: 0 %
Lymphocytes Relative: 12 %
Lymphs Abs: 1 10*3/uL (ref 0.7–4.0)
MCH: 29.1 pg (ref 26.0–34.0)
MCHC: 32.4 g/dL (ref 30.0–36.0)
MCV: 89.7 fL (ref 80.0–100.0)
Monocytes Absolute: 0.9 10*3/uL (ref 0.1–1.0)
Monocytes Relative: 11 %
Neutro Abs: 5.9 10*3/uL (ref 1.7–7.7)
Neutrophils Relative %: 74 %
Platelets: 142 10*3/uL — ABNORMAL LOW (ref 150–400)
RBC: 4.68 MIL/uL (ref 3.87–5.11)
RDW: 14.1 % (ref 11.5–15.5)
WBC: 8.1 10*3/uL (ref 4.0–10.5)
nRBC: 0 % (ref 0.0–0.2)

## 2023-07-05 LAB — MAGNESIUM: Magnesium: 1.9 mg/dL (ref 1.7–2.4)

## 2023-07-05 MED ORDER — TORSEMIDE 20 MG PO TABS
20.0000 mg | ORAL_TABLET | Freq: Every day | ORAL | 1 refills | Status: DC
  Filled 2023-07-05: qty 30, 30d supply, fill #0

## 2023-07-05 MED ORDER — POLYETHYLENE GLYCOL 3350 17 GM/SCOOP PO POWD
17.0000 g | Freq: Every day | ORAL | 0 refills | Status: DC | PRN
  Filled 2023-07-05: qty 238, 14d supply, fill #0

## 2023-07-05 NOTE — Discharge Summary (Incomplete)
Physician Discharge Summary  Patient ID: Charlene Tapia MRN: [id number] DOB/AGE: 71-12-1950 71 y.o.  Admit date: 07/03/2023 Discharge date: 07/05/2023  Admission Diagnoses:  Discharge Diagnoses:  Principal Problem:   Acute respiratory failure with hypoxia (HCC) Active Problems:   Obesity, morbid, BMI 50 or higher (HCC)   Psoriasis   Hyperlipidemia   HTN (hypertension)   Severe aortic stenosis   S/P TAVR (transcatheter aortic valve replacement)   Acute on chronic diastolic heart failure (HCC)   Symptomatic bradycardia   RSV infection   Acute on chronic diastolic CHF (congestive heart failure) (HCC)   Discharged Condition: {condition:18240}  Hospital Course: ***  Consults: {consultation:18241}  Significant Diagnostic Studies: {diagnostics:18242}  Treatments: {Tx:18249}  Discharge Exam: Blood pressure (!) 120/52, pulse 79, temperature 98.4 F (36.9 C), resp. rate 18, weight 121 kg, SpO2 91%. {physical WUXL:2440102}  Disposition: Discharge disposition: 01-Home or Self Care       Discharge Instructions     Diet - low sodium heart healthy   Complete by: As directed    Increase activity slowly   Complete by: As directed       Allergies as of 07/05/2023       Reactions   Phisohex [hexachlorophene] Rash   Blistering rash        Medication List     STOP taking these medications    amoxicillin 500 MG capsule Commonly known as: AMOXIL   Clobetasol Propionate E 0.05 % emollient cream Generic drug: Clobetasol Prop Emollient Base   furosemide 20 MG tablet Commonly known as: Lasix   ibuprofen 200 MG tablet Commonly known as: ADVIL       TAKE these medications    amLODipine 5 MG tablet Commonly known as: NORVASC Take 1 tablet (5 mg total) by mouth daily.   aspirin EC 81 MG tablet Take 1 tablet (81 mg total) by mouth daily. Swallow whole.   fluticasone 50 MCG/ACT nasal spray Commonly known as: FLONASE Place 2 sprays into both nostrils  daily as needed for allergies or rhinitis.   metoprolol succinate 25 MG 24 hr tablet Commonly known as: Toprol XL Take 0.5 tablets (12.5 mg total) by mouth at bedtime.   MULTIVITAMIN GUMMIES WOMENS PO Take 2 each by mouth daily after supper.   polyethylene glycol 17 g packet Commonly known as: MIRALAX / GLYCOLAX Take 17 g by mouth daily as needed for mild constipation.   torsemide 20 MG tablet Commonly known as: DEMADEX Take 1 tablet (20 mg total) by mouth daily.               Durable Medical Equipment  (From admission, onward)           Start     Ordered   07/05/23 1255  DME Oxygen  Once       Question Answer Comment  Length of Need 6 Months   Mode or (Route) Nasal cannula   Liters per Minute 2   Oxygen delivery system Gas      07/05/23 1255             Signed: Barnetta Chapel 07/05/2023, 12:55 PM

## 2023-07-05 NOTE — Progress Notes (Signed)
PROGRESS NOTE    Charlene Tapia  NWG:[id number] DOB: April 28, 1951 DOA: 07/03/2023 PCP: Elenore Paddy, NP  Outpatient Specialists:     Brief Narrative:  Patient is a 73 year old female, morbidly obese, past medical history significant for  hypertension, hyperlipidemia, aortic stenosis status post TAVR, symptomatic bradycardia status post pacemaker, chronic diastolic CHF, psoriasis, and obesity.  Patient was admitted with shortness of breath.  Workup has revealed acute respiratory failure with hypoxia, RSV infection and acute on chronic diastolic CHF.  07/04/2023: Patient is slowly improving, but still feels very unwell.  Will continue diuresis.  Supportive care. 07/05/2023: Patient seen.  Patient has improved significantly.  Patient is ready to be discharged on oral diuretics on home oxygen.  Transition of care team to advise when patient can be discharged.  Please pursue disposition.  Assessment & Plan:   Principal Problem:   Acute respiratory failure with hypoxia (HCC) Active Problems:   Obesity, morbid, BMI 50 or higher (HCC)   Psoriasis   Hyperlipidemia   HTN (hypertension)   Severe aortic stenosis   S/P TAVR (transcatheter aortic valve replacement)   Acute on chronic diastolic heart failure (HCC)   Symptomatic bradycardia   RSV infection   Acute on chronic diastolic CHF (congestive heart failure) (HCC)   Acute respiratory failure with hypoxia RSV Acute on chronic diastolic CHF -Last echo was in October of last year with EF 65 to 70%, indeterminate diastolic function, normal RV function. -Patient has not had Lasix in several days and has had worsening lower extremity edema. -Sudden worsening shortness of breath while driving to work this morning.  Stop at PCP office as it was on the way and due to her respiratory distress was transported to the ED via EMS. -Found to be hypoxic for EMS in the 80s as well as wheezing.  Received DuoNebs and supplemental oxygen with improvement  and route. -In the ED hypoxia resolved and patient is now saturating well on room air.  Positive for RSV, commendation of RSV infection on mild to moderate CHF exacerbation likely led to severity of respiratory distress. -Continue IV Lasix 40 Mg twice daily.   -Continue supportive care. - Continue with as needed DuoNeb -Follow renal function and electrolytes. 07/05/2023: Stable.  Continue supportive care.  Transition to oral torsemide on discharge.    Aortic stenosis - Status post TAVR   Symptomatic bradycardia - Status post pacemaker   Hypertension - Continue home amlodipine, metoprolol - Lasix as above   Psoriasis - Continue as needed clobetasol emollient   DVT prophylaxis: Subcutaneous Lovenox. Code Status: Full code. Family Communication:  Disposition Plan: Patient remains inpatient.  Multiple and severe comorbidities.   Consultants:  None.  Procedures:  None.  Antimicrobials:  None.   Subjective: Patient feels a lot better today.  Objective: Vitals:   07/05/23 0818 07/05/23 1000 07/05/23 1427 07/05/23 1430  BP: 129/60 (!) 120/52    Pulse: 79     Resp: 18     Temp: 98.4 F (36.9 C)     TempSrc:      SpO2: 91%  (!) 87% 90%  Weight:       No intake or output data in the 24 hours ending 07/05/23 1752  Filed Weights   07/05/23 0419  Weight: 121 kg    Examination:  General exam: Patient is awake and alert.  Patient is morbidly obese. Respiratory system: Clear to auscultation.  Cardiovascular system: S1 & S2 heard Gastrointestinal system: Abdomen is obese, soft and nontender.  Central nervous system: Awake and alert.   Extremities: Bilateral lower extremity edema has continued to improve.. Data Reviewed: I have personally reviewed following labs and imaging studies  CBC: Recent Labs  Lab 07/03/23 0947 07/03/23 1041 07/04/23 0753 07/05/23 0712  WBC 6.8  --  8.0 8.1  NEUTROABS 4.7  --   --  5.9  HGB 12.6 12.9 13.1 13.6  HCT 39.7 38.0 42.0  42.0  MCV 90.2  --  90.5 89.7  PLT 133*  --  160 142*   Basic Metabolic Panel: Recent Labs  Lab 07/03/23 0947 07/03/23 1041 07/03/23 1147 07/04/23 0753 07/05/23 0712  NA 140 141  --  139 137  K 3.3* 3.2*  --  4.5 4.2  CL 105 105  --  102 102  CO2 24  --   --  27 23  GLUCOSE 125* 122*  --  96 105*  BUN 9 9  --  14 20  CREATININE 0.73 0.70  --  0.70 0.73  CALCIUM 8.9  --   --  9.2 9.2  MG  --   --  1.8  --  1.9  PHOS  --   --   --   --  4.4   GFR: Estimated Creatinine Clearance: 80.6 mL/min (by C-G formula based on SCr of 0.73 mg/dL). Liver Function Tests: Recent Labs  Lab 07/04/23 0753 07/05/23 0712  AST 16  --   ALT 18  --   ALKPHOS 37*  --   BILITOT 0.9  --   PROT 6.3*  --   ALBUMIN 3.5 3.5   No results for input(s): "LIPASE", "AMYLASE" in the last 168 hours. No results for input(s): "AMMONIA" in the last 168 hours. Coagulation Profile: No results for input(s): "INR", "PROTIME" in the last 168 hours. Cardiac Enzymes: No results for input(s): "CKTOTAL", "CKMB", "CKMBINDEX", "TROPONINI" in the last 168 hours. BNP (last 3 results) No results for input(s): "PROBNP" in the last 8760 hours. HbA1C: No results for input(s): "HGBA1C" in the last 72 hours. CBG: No results for input(s): "GLUCAP" in the last 168 hours. Lipid Profile: No results for input(s): "CHOL", "HDL", "LDLCALC", "TRIG", "CHOLHDL", "LDLDIRECT" in the last 72 hours. Thyroid Function Tests: No results for input(s): "TSH", "T4TOTAL", "FREET4", "T3FREE", "THYROIDAB" in the last 72 hours. Anemia Panel: No results for input(s): "VITAMINB12", "FOLATE", "FERRITIN", "TIBC", "IRON", "RETICCTPCT" in the last 72 hours. Urine analysis:    Component Value Date/Time   COLORURINE STRAW (A) 01/31/2023 0842   APPEARANCEUR CLEAR 01/31/2023 0842   LABSPEC 1.004 (L) 01/31/2023 0842   PHURINE 6.0 01/31/2023 0842   GLUCOSEU NEGATIVE 01/31/2023 0842   HGBUR NEGATIVE 01/31/2023 0842   BILIRUBINUR NEGATIVE 01/31/2023  0842   KETONESUR NEGATIVE 01/31/2023 0842   PROTEINUR NEGATIVE 01/31/2023 0842   UROBILINOGEN 0.2 09/22/2013 1104   NITRITE NEGATIVE 01/31/2023 0842   LEUKOCYTESUR NEGATIVE 01/31/2023 0842   Sepsis Labs: @LABRCNTIP (procalcitonin:4,lacticidven:4)  ) Recent Results (from the past 240 hours)  Resp panel by RT-PCR (RSV, Flu A&B, Covid) Anterior Nasal Swab     Status: Abnormal   Collection Time: 07/03/23  9:47 AM   Specimen: Anterior Nasal Swab  Result Value Ref Range Status   SARS Coronavirus 2 by RT PCR NEGATIVE NEGATIVE Final   Influenza A by PCR NEGATIVE NEGATIVE Final   Influenza B by PCR NEGATIVE NEGATIVE Final    Comment: (NOTE) The Xpert Xpress SARS-CoV-2/FLU/RSV plus assay is intended as an aid in the diagnosis of influenza from Nasopharyngeal swab  specimens and should not be used as a sole basis for treatment. Nasal washings and aspirates are unacceptable for Xpert Xpress SARS-CoV-2/FLU/RSV testing.  Fact Sheet for Patients: BloggerCourse.com  Fact Sheet for Healthcare Providers: SeriousBroker.it  This test is not yet approved or cleared by the [place] FDA and has been authorized for detection and/or diagnosis of SARS-CoV-2 by FDA under an Emergency Use Authorization (EUA). This EUA will remain in effect (meaning this test can be used) for the duration of the COVID-19 declaration under Section 564(b)(1) of the Act, 21 U.S.C. section 360bbb-3(b)(1), unless the authorization is terminated or revoked.     Resp Syncytial Virus by PCR POSITIVE (A) NEGATIVE Final    Comment: (NOTE) Fact Sheet for Patients: BloggerCourse.com  Fact Sheet for Healthcare Providers: SeriousBroker.it  This test is not yet approved or cleared by the [place] FDA and has been authorized for detection and/or diagnosis of SARS-CoV-2 by FDA under an Emergency Use Authorization (EUA).  This EUA will remain in effect (meaning this test can be used) for the duration of the COVID-19 declaration under Section 564(b)(1) of the Act, 21 U.S.C. section 360bbb-3(b)(1), unless the authorization is terminated or revoked.  Performed at [facility], [address], [place], [place] [address]          Radiology Studies: DG CHEST PORT 1 VIEW Result Date: 07/05/2023 CLINICAL DATA:  141880 SOB (shortness of breath) [id number] EXAM: PORTABLE CHEST 1 VIEW COMPARISON:  07/03/2023 FINDINGS: Left-sided implanted cardiac device remains in place. Mild cardiomegaly. Aortic atherosclerosis. No focal airspace consolidation, pleural effusion, or pneumothorax. IMPRESSION: Mild cardiomegaly. No acute cardiopulmonary findings. Electronically Signed   By: Duanne Guess D.O.   On: 07/05/2023 13:13        Scheduled Meds:  amLODipine  5 mg Oral Daily   enoxaparin (LOVENOX) injection  40 mg Subcutaneous Q24H   furosemide  40 mg Intravenous BID   metoprolol succinate  12.5 mg Oral QHS   sodium chloride flush  3 mL Intravenous Q12H   Continuous Infusions:   LOS: 1 day    Time spent: 35 minutes.    Berton Mount, MD  [facility] Pager #: [phone] 7PM-7AM contact night coverage as above

## 2023-07-05 NOTE — Plan of Care (Signed)

## 2023-07-05 NOTE — Progress Notes (Signed)
Ambulated pt in room spo2 97(room air).  1430: 1L Spo2 placed 90o2

## 2023-07-05 NOTE — Progress Notes (Signed)
RNCM received Oxygen order.  RNCM contacted [name] at [employer] with order and confirmation received.  Oxygen to be delivered to patient's room prior to d/c home.

## 2023-07-06 DIAGNOSIS — J9601 Acute respiratory failure with hypoxia: Secondary | ICD-10-CM | POA: Diagnosis not present

## 2023-07-06 MED ORDER — IPRATROPIUM-ALBUTEROL 20-100 MCG/ACT IN AERS: 2.0000 | INHALATION_SPRAY | Freq: Four times a day (QID) | RESPIRATORY_TRACT | 1 refills | Status: AC | PRN

## 2023-07-06 MED ORDER — TORSEMIDE 20 MG PO TABS
20.0000 mg | ORAL_TABLET | Freq: Every day | ORAL | Status: DC
  Administered 2023-07-06: 20 mg via ORAL
  Filled 2023-07-06: qty 1

## 2023-07-06 MED ORDER — IPRATROPIUM-ALBUTEROL 20-100 MCG/ACT IN AERS: 2.0000 | INHALATION_SPRAY | Freq: Four times a day (QID) | RESPIRATORY_TRACT | Status: DC | PRN

## 2023-07-06 MED ORDER — FLUTICASONE PROPIONATE 50 MCG/ACT NA SUSP
2.0000 | Freq: Every day | NASAL | Status: DC
  Filled 2023-07-06: qty 16

## 2023-07-06 MED ORDER — GUAIFENESIN ER 600 MG PO TB12: 600.0000 mg | ORAL_TABLET | Freq: Two times a day (BID) | ORAL | 0 refills | Status: AC

## 2023-07-06 MED ORDER — GUAIFENESIN ER 600 MG PO TB12
600.0000 mg | ORAL_TABLET | Freq: Two times a day (BID) | ORAL | Status: DC
  Administered 2023-07-06: 600 mg via ORAL
  Filled 2023-07-06: qty 1

## 2023-07-06 NOTE — Plan of Care (Signed)
  Problem: Education: Goal: Knowledge of General Education information will improve Description: Including pain rating scale, medication(s)/side effects and non-pharmacologic comfort measures Outcome: Progressing   Problem: Clinical Measurements: Goal: Ability to maintain clinical measurements within normal limits will improve Outcome: Progressing   Problem: Clinical Measurements: Goal: Diagnostic test results will improve Outcome: Progressing   

## 2023-07-06 NOTE — Progress Notes (Signed)
O2 Sat on Room air (resting): 92%  O2 Sat on Room Air (ambulating): 90%

## 2023-07-06 NOTE — Progress Notes (Signed)
Patient did not qualify for home Oxygen based on Oxygen saturations documented.

## 2023-07-06 NOTE — Discharge Summary (Signed)
Physician Discharge Summary  Patient ID: Charlene Tapia MRN: [id number] DOB/AGE: 1951-01-23 73 y.o.  Admit date: 07/03/2023 Discharge date: 07/06/2023  Admission Diagnoses:  Discharge Diagnoses:  Principal Problem:   Acute respiratory failure with hypoxia (HCC) Active Problems:   Obesity, morbid, BMI 50 or higher (HCC)   Psoriasis   Hyperlipidemia   HTN (hypertension)   Severe aortic stenosis   S/P TAVR (transcatheter aortic valve replacement)   Acute on chronic diastolic heart failure (HCC)   Symptomatic bradycardia   RSV infection   Acute on chronic diastolic CHF (congestive heart failure) (HCC)   Discharged Condition: stable  Hospital Course:  Patient is a 73 year old female, morbidly obese, with past medical history significant for hypertension, hyperlipidemia, aortic stenosis status post TAVR, symptomatic bradycardia status post pacemaker, chronic diastolic CHF, psoriasis, and obesity. Patient was admitted with shortness of breath.  Patient was admitted and managed for acute respiratory failure with hypoxia, RSV infection and acute on chronic diastolic CHF.   Acute respiratory failure with hypoxia RSV Acute on chronic diastolic CHF -Last echo done in October 2024 revealed estimated left ventricular function of 65 to 70%, indeterminate diastolic function, normal RV function. -Patient did not take Lasix several days prior to presentation, with worsening lower extremity edema. -Patient had sudden worsening shortness of breath while driving to work this morning.  Patient stopped at PCP's office and was advised to come to the hospital for further assessment and management.   -Patient wound found to be hypoxic, wheezing, with oxygen saturation in the 80s.   -Patient tested positive for RSV -Patient was also in acute congestive heart failure exacerbation. -Patient was managed with IV diuretics. -Patient will be transition to oral torsemide on discharge. -RSV was managed  supportively. -Patient has improved significantly. -Patient will follow-up with primary care provider and cardiology team on discharge.-    Aortic stenosis - Status post TAVR   Symptomatic bradycardia - Status post pacemaker   Hypertension - Continued home amlodipine, metoprolol - Lasix as above   Psoriasis - Continue as needed clobetasol emollient   Consults: None  Significant Diagnostic Studies:    Discharge Exam: Blood pressure (!) 142/63, pulse 76, temperature 98.2 F (36.8 C), resp. rate 20, weight 117.8 kg, SpO2 92%.   Disposition: Discharge disposition: 01-Home or Self Care       Discharge Instructions     Diet - low sodium heart healthy   Complete by: As directed    Increase activity slowly   Complete by: As directed    Increase activity slowly   Complete by: As directed       Allergies as of 07/06/2023       Reactions   Phisohex [hexachlorophene] Rash   Blistering rash        Medication List     STOP taking these medications    amoxicillin 500 MG capsule Commonly known as: AMOXIL   Clobetasol Propionate E 0.05 % emollient cream Generic drug: Clobetasol Prop Emollient Base   furosemide 20 MG tablet Commonly known as: Lasix   ibuprofen 200 MG tablet Commonly known as: ADVIL       TAKE these medications    amLODipine 5 MG tablet Commonly known as: NORVASC Take 1 tablet (5 mg total) by mouth daily.   aspirin EC 81 MG tablet Take 1 tablet (81 mg total) by mouth daily. Swallow whole.   fluticasone 50 MCG/ACT nasal spray Commonly known as: FLONASE Place 2 sprays into both nostrils daily as needed for  allergies or rhinitis.   guaiFENesin 600 MG 12 hr tablet Commonly known as: MUCINEX Take 1 tablet (600 mg total) by mouth 2 (two) times daily for 7 days.   Ipratropium-Albuterol 20-100 MCG/ACT Aers respimat Commonly known as: COMBIVENT Inhale 2 puffs into the lungs every 6 (six) hours as needed for wheezing or shortness of  breath.   metoprolol succinate 25 MG 24 hr tablet Commonly known as: Toprol XL Take 0.5 tablets (12.5 mg total) by mouth at bedtime.   MULTIVITAMIN GUMMIES WOMENS PO Take 2 each by mouth daily after supper.   polyethylene glycol powder 17 GM/SCOOP powder Commonly known as: GLYCOLAX/MIRALAX Take 17 g (1 capful) by mouth daily as needed for mild constipation.  Dissolve in water. (Take 17 g by mouth daily as needed for mild constipation.)   torsemide 20 MG tablet Commonly known as: DEMADEX Take 1 tablet (20 mg total) by mouth daily.               Durable Medical Equipment  (From admission, onward)           Start     Ordered   07/05/23 1255  DME Oxygen  Once       Question Answer Comment  Length of Need 6 Months   Mode or (Route) Nasal cannula   Liters per Minute 2   Oxygen delivery system Gas      07/05/23 1255            Time spent: 35 minutes.  SignedBarnetta Chapel 07/06/2023, 3:34 PM

## 2023-07-07 ENCOUNTER — Other Ambulatory Visit (HOSPITAL_COMMUNITY): Payer: Self-pay

## 2023-07-11 ENCOUNTER — Other Ambulatory Visit: Payer: Self-pay | Admitting: Nurse Practitioner

## 2023-07-11 DIAGNOSIS — R590 Localized enlarged lymph nodes: Secondary | ICD-10-CM

## 2023-07-11 NOTE — Progress Notes (Signed)
Please reach out to patient and let her know that I have ordered repeat pelvic ultrasound to see if she still has enlarged lymphnodes as seen on original ultrasound in 03/2023. Scheduling department should reach out within the next few business days to get this scheduled. Please let me know if she has any questions.

## 2023-07-12 NOTE — Progress Notes (Unsigned)
 Patient ID: Charlene Tapia MRN: [id number] DOB/AGE: Oct 08, 1950 73 y.o.  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 female with the indicated medical history here for recommendations regarding patient's aortic valvular disease.  The patient was referred for an echocardiogram today which demonstrated severe aortic stenosis and moderate aortic insufficiency consistent with severe aortic valvular disease.  She saw her PCP recently and a murmur (which has been longstanding) was noticed and an echo was ordered.  The patient is here with her husband.  She is quite overweight and by her own admission does not do much in a day.  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  She however was quite active several years ago.  However her husband has noticed that she walks much slower now.  She has noticed increasing fatigue and shortness of breath just by moving groceries in from her car.  She fortunately has not required any hospitalizations or emergency room visits for heart failure or chest pain symptoms.  She denies any presyncope, syncope, chest pain, peripheral edema, paroxysmal nocturnal dyspnea or palpitations.  She has had no severe bleeding or bruising episodes.  She regularly sees a Education officer, community.  Plan: Refer for TAVR evaluation.  February 2025: Patient consents to use of AI scribe.  In the interim the patient did have her TAVR procedure in September 2024 that was without acute complications.  She returned shortly  after discharge after developing bradycardia and intermittent second-degree block and received a permanent pacemaker.  She was seen in follow-up in October 2024 and was doing well.  She was noted to have some pelvic lymphadenopathy and a follow-up ultrasound demonstrated these to likely be reactive.  She was recently admitted to the hospital with an RSV infection and acute respiratory failure with hypoxia.  She recently experienced significant dyspnea, leading to a hospital admission. Prior to hospitalization, she felt unwell with symptoms including epistaxis and a cough. On February 13th, she was unable to breathe while on her way to work and sought medical attention, resulting in a four-day hospitalization. During this time, a significant amount of fluid was removed, leading to a noticeable reduction in leg edema.  She underwent a heart valve replacement in September, followed by pacemaker insertion due to arrhythmias. Post-procedure, elevated intracardiac pressures were noted. She reports doing well with the pacemaker and has not experienced any issues with it.  Since her hospitalization, she has been compliant with her medications, including torsemide, which she takes daily. She previously did not adhere to her Lasix regimen. No current issues with dyspnea, chest pain, or palpitations. She has a longstanding habit of sleeping with her head elevated due to sinus issues and denies any recent lightheadedness or syncope.  She has a history of varicose veins and notes that her legs have been red, which she attributes to her veins.  She has a history of chronic sinusitis, managed with nasal irrigation and fluticasone. She experienced epistaxis during a recent RSV infection, which has since resolved.  Past Medical History:  Diagnosis Date  Allergy    High cholesterol    HTN (hypertension) 10/18/2022   Osteoarthritis    "knees; thumbs" (10/01/2013)   S/P TAVR (transcatheter aortic valve replacement)  02/04/2023   s/p TAVR with a 29 mm Medtronic Evolut FX via the TF approach by Dr. Lynnette Caffey and Dr. Leafy Ro   Severe aortic stenosis     Past Surgical History:  Procedure Laterality Date   GANGLION CYST EXCISION Right 01/18/1989   INGUINAL HERNIA REPAIR Bilateral 1954   "age 25 month"   INTRAOPERATIVE TRANSTHORACIC ECHOCARDIOGRAM N/A 02/04/2023   Procedure: INTRAOPERATIVE TRANSTHORACIC ECHOCARDIOGRAM;  Surgeon: Orbie Pyo, MD;  Location: [facility] INVASIVE CV LAB;  Service: Open Heart Surgery;  Laterality: N/A;   JOINT REPLACEMENT     PACEMAKER IMPLANT N/A 02/18/2023   Procedure: PACEMAKER IMPLANT;  Surgeon: [name], MD;  Location: [facility] INVASIVE CV LAB;  Service: Cardiovascular;  Laterality: N/A;   RIGHT HEART CATH AND CORONARY ANGIOGRAPHY N/A 11/25/2022   Procedure: RIGHT HEART CATH AND CORONARY ANGIOGRAPHY;  Surgeon: Orbie Pyo, MD;  Location: [facility] INVASIVE CV LAB;  Service: Cardiovascular;  Laterality: N/A;   TEMPORARY PACEMAKER N/A 02/17/2023   Procedure: TEMPORARY PACEMAKER;  Surgeon: Yvonne Kendall, MD;  Location: [facility] INVASIVE CV LAB;  Service: Cardiovascular;  Laterality: N/A;   TOTAL KNEE ARTHROPLASTY Left 08/16/2013   Procedure: TOTAL KNEE ARTHROPLASTY;  Surgeon: Nestor Lewandowsky, MD;  Location: [facility] OR;  Service: Orthopedics;  Laterality: Left;   TOTAL KNEE ARTHROPLASTY Right 09/29/2013   TOTAL KNEE ARTHROPLASTY Right 09/29/2013   Procedure: TOTAL KNEE ARTHROPLASTY;  Surgeon: Nestor Lewandowsky, MD;  Location: [facility] OR;  Service: Orthopedics;  Laterality: Right;   TRANSCATHETER AORTIC VALVE REPLACEMENT, TRANSFEMORAL N/A 02/04/2023   Procedure: Transcatheter Aortic Valve Replacement, Transfemoral;  Surgeon: Orbie Pyo, MD;  Location: [facility] INVASIVE CV LAB;  Service: Open Heart Surgery;  Laterality: N/A;    Family History  Problem Relation Age of Onset   Heart disease Father    Hypertension Father    Alcohol abuse Father     Social History   Socioeconomic History   Marital status:  Married    Spouse name: Not on file   Number of children: Not on file   Years of education: Not on file   Highest education level: Not on file  Occupational History   Not on file  Tobacco Use   Smoking status: Never   Smokeless tobacco: Never  Substance and Sexual Activity   Alcohol use: Yes    Comment: Social drinking wine only   Drug use: No   Sexual activity: Not Currently    Comment: 31 years of age/spouse w/ ED  Other Topics Concern   Not on file  Social History Narrative   Not on file   Social Drivers of Health   Financial Resource Strain: Low Risk  (02/25/2023)   Overall Financial Resource Strain (CARDIA)    Difficulty of Paying Living Expenses: Not hard at all  Food Insecurity: No Food Insecurity (07/03/2023)   Hunger Vital Sign    Worried About Running Out of Food in the Last Year: Never true    Ran Out of Food in the Last Year: Never true  Transportation Needs: No Transportation Needs (07/03/2023)   PRAPARE - Administrator, Civil Service (Medical): No    Lack of Transportation (Non-Medical): No  Physical Activity: Insufficiently Active (02/25/2023)   Exercise Vital Sign    Days of Exercise per Week: 3 days  Minutes of Exercise per Session: 30 min  Stress: No Stress Concern Present (02/25/2023)   [employer] of Occupational Health - Occupational Stress Questionnaire    Feeling of Stress : Not at all  Social Connections: Socially Integrated (07/03/2023)   Social Connection and Isolation Panel [NHANES]    Frequency of Communication with Friends and Family: Three times a week    Frequency of Social Gatherings with Friends and Family: Once a week    Attends Religious Services: More than 4 times per year    Active Member of [employer] or Organizations: Yes    Attends Engineer, structural: More than 4 times per year    Marital Status: Married  Catering manager Violence: Not At Risk (07/03/2023)   Humiliation, Afraid, Rape, and Kick questionnaire     Fear of Current or Ex-Partner: No    Emotionally Abused: No    Physically Abused: No    Sexually Abused: No     Prior to Admission medications   Medication Sig Start Date End Date Taking? Authorizing Provider  amLODipine (NORVASC) 5 MG tablet Take 1 tablet (5 mg total) by mouth daily. 11/01/22   Elenore Paddy, NP  Clobetasol Prop Emollient Base (CLOBETASOL PROPIONATE E) 0.05 % emollient cream Apply to affected area 2 (two) times daily. 10/18/22   Elenore Paddy, NP  fluticasone Aleda Grana) 50 MCG/ACT nasal spray Place 2 sprays into both nostrils daily as needed for allergies or rhinitis.    [provider]  ibuprofen (ADVIL) 200 MG tablet Take 400 mg by mouth every 8 (eight) hours as needed for mild pain or moderate pain.    [provider]    Allergies  Allergen Reactions   Phisohex [Hexachlorophene] Rash    Blistering rash    REVIEW OF SYSTEMS:  General: no fevers/chills/night sweats Eyes: no blurry vision, diplopia, or amaurosis ENT: no sore throat or hearing loss Resp: no cough, wheezing, or hemoptysis CV: no edema or palpitations GI: no abdominal pain, nausea, vomiting, diarrhea, or constipation GU: no dysuria, frequency, or hematuria Skin: no rash Neuro: no headache, numbness, tingling, or weakness of extremities Musculoskeletal: no joint pain or swelling Heme: no bleeding, DVT, or easy bruising Endo: no polydipsia or polyuria  BP (!) 144/64   Pulse 81   Ht 5' 2.5" (1.588 m)   Wt 246 lb (111.6 kg)   SpO2 99%   BMI 44.28 kg/m   PHYSICAL EXAM: GEN:  AO x 3 in no acute distress HEENT: normal Dentition: Normal Neck: JVP normal. +2 carotid upstrokes without bruits. No thyromegaly. Lungs: equal expansion, clear bilaterally CV: Apex is discrete and nondisplaced, RRR with mild systolic murmur Abd: soft, non-tender, non-distended; no bruit; positive bowel sounds Ext: Trace edema, no ecchymoses, or cyanosis Vascular: 2+ femoral pulses, 2+ radial pulses        Skin: warm and dry without rash Neuro: CN II-XII grossly intact; motor and sensory grossly intact    DATA AND STUDIES:  EKG: EKG performed February 2025 demonstrates sinus rhythm with IVCD and left axis deviation  2D ECHO: September 2024  1. Left ventricular ejection fraction, by estimation, is 70 to 75%. The  left ventricle has hyperdynamic function. The left ventricle has no  regional wall motion abnormalities. Left ventricular diastolic function  could not be evaluated.   2. Right ventricular systolic function is normal. The right ventricular  size is normal. Tricuspid regurgitation signal is inadequate for assessing  PA pressure.   3. Left atrial size  was severely dilated.   4. Right atrial size was moderately dilated.   5. The mitral valve is degenerative. No evidence of mitral valve  regurgitation. Moderate mitral stenosis. The mean mitral valve gradient is  8.0 mmHg with average heart rate of 82 bpm. Severe mitral annular  calcification.   6. The aortic valve has been repaired/replaced. Aortic valve  regurgitation is not visualized. There is a 29 mm CoreValve-Evolut Pro  prosthetic (TAVR) valve present in the aortic position. Procedure Date:  02/04/2023. Echo findings are consistent with  normal structure and function of the aortic valve prosthesis. Aortic valve  mean gradient measures 17.7 mmHg. Aortic valve Vmax measures 2.79 m/s.  Aortic valve acceleration time measures 66 msec.   7. The inferior vena cava is normal in size with greater than 50%  respiratory variability, suggesting right atrial pressure of 3 mmHg         ASSESSMENT AND PLAN:   S/P TAVR (transcatheter aortic valve replacement)  Chronic diastolic heart failure (HCC)  Coronary artery disease involving native coronary artery of native heart without angina pectoris  Hyperlipidemia LDL goal <70 - Plan: AMB Referral to [facility] Pharm-D, Lipid panel, Lipoprotein A (LPA)  BMI 40.0-44.9, adult  [facility])  Pelvic lymphadenopathy  Pacemaker  Essential hypertension - Plan: Basic metabolic panel  Status post TAVR: Recent echocardiogram demonstrated normal valve function.  Continue aspirin 81 mg indefinitely. Chronic diastolic heart failure: Discontinue amlodipine.  Continue Toprol-XL 25 mg, start Jardiance 10 mg daily, continue torsemide 20 mg daily, start losartan 25 mg daily, and spironolactone 25 mg daily.  Check BMP in 1 week. Coronary artery disease: Continue aspirin 81 mg, refer to pharmacy for lipid management. Hyperlipidemia: Patient has many issues with cramps in his reluctant to start a statin.  Will check lipid panel and LP(a) today.  I do not think she will be at goal of less than 70.  Will refer to pharmacy for further recommendations. Elevated BMI: Continue diet and exercise modification.  Hemoglobin A1c was not suggestive of diabetes within the last several months. Pelvic lymphadenopathy: Workup has thus far suggested reactive lymphadenopathy.  Patient's primary care provider has ordered a repeat ultrasound. Status post pacemaker: Followed by EP. Hypertension: Above goal today.  Discontinue amlodipine, start losartan 25 mg and spironolactone 25 mg.  Total time spent with patient today 33 minutes. This includes reviewing records, evaluating the patient and coordinating care.   Orbie Pyo, MD  07/17/2023 11:03 AM    [facility] [address] [place], [place], [place]  [address] Phone: [phone]; Fax: [phone]

## 2023-07-14 NOTE — Progress Notes (Signed)
 Pt is aware of order and has already schedule an appointment

## 2023-07-16 ENCOUNTER — Telehealth: Payer: Self-pay | Admitting: Internal Medicine

## 2023-07-16 NOTE — Telephone Encounter (Signed)
 Received [employer] FMLA form today. Patient has an appointment with Dr. Lynnette Caffey tomorrow, 07/17/2023.  She will sign the ROI and pay the fee at that time.

## 2023-07-17 ENCOUNTER — Encounter: Payer: Self-pay | Admitting: Internal Medicine

## 2023-07-17 ENCOUNTER — Ambulatory Visit: Payer: Medicare Other | Attending: Internal Medicine | Admitting: Internal Medicine

## 2023-07-17 VITALS — BP 144/64 | HR 81 | Ht 62.5 in | Wt 246.0 lb

## 2023-07-17 DIAGNOSIS — Z950 Presence of cardiac pacemaker: Secondary | ICD-10-CM

## 2023-07-17 DIAGNOSIS — R590 Localized enlarged lymph nodes: Secondary | ICD-10-CM

## 2023-07-17 DIAGNOSIS — I5032 Chronic diastolic (congestive) heart failure: Secondary | ICD-10-CM

## 2023-07-17 DIAGNOSIS — I10 Essential (primary) hypertension: Secondary | ICD-10-CM

## 2023-07-17 DIAGNOSIS — E785 Hyperlipidemia, unspecified: Secondary | ICD-10-CM

## 2023-07-17 DIAGNOSIS — Z0279 Encounter for issue of other medical certificate: Secondary | ICD-10-CM

## 2023-07-17 DIAGNOSIS — I2510 Atherosclerotic heart disease of native coronary artery without angina pectoris: Secondary | ICD-10-CM

## 2023-07-17 DIAGNOSIS — Z6841 Body Mass Index (BMI) 40.0 and over, adult: Secondary | ICD-10-CM | POA: Diagnosis present

## 2023-07-17 DIAGNOSIS — Z952 Presence of prosthetic heart valve: Secondary | ICD-10-CM | POA: Diagnosis present

## 2023-07-17 MED ORDER — SPIRONOLACTONE 25 MG PO TABS: 25.0000 mg | ORAL_TABLET | Freq: Every day | ORAL | 3 refills | Status: AC

## 2023-07-17 MED ORDER — EMPAGLIFLOZIN 10 MG PO TABS: 10.0000 mg | ORAL_TABLET | Freq: Every day | ORAL | 0 refills | Status: DC

## 2023-07-17 MED ORDER — LOSARTAN POTASSIUM 25 MG PO TABS: 25.0000 mg | ORAL_TABLET | Freq: Every day | ORAL | 3 refills | Status: AC

## 2023-07-17 MED ORDER — EMPAGLIFLOZIN 10 MG PO TABS: 10.0000 mg | ORAL_TABLET | Freq: Every day | ORAL | 3 refills | Status: DC

## 2023-07-17 NOTE — Telephone Encounter (Signed)
 Pt paid and signed fmla [employer] forms. Copies are in [employer]. Original Documentation will be put in providers box by eod.

## 2023-07-17 NOTE — Patient Instructions (Signed)
 Medication Instructions:  Your physician has recommended you make the following change in your medication:   1) STOP amlodipine 2) START empagliflozin (Jardiance) 10 mg daily with breakfast 3) START losartan 25 mg daily in the morning 4) START spironolactone 25 mg daily in the morning  *If you need a refill on your cardiac medications before your next appointment, please call your pharmacy*  Lab Work: Next week at [facility]: BMP, lipid panel, LP(a) You may go to any of these [facility] locations:   [facility] - [address] ([facility]) - [address] [phone] [address]   [place] - [address] - [address]  If you have labs (blood work) drawn today and your tests are completely normal, you will receive your results only by: [employer] (if you have MyChart) OR A paper copy in the mail If you have any lab test that is abnormal or we need to change your treatment, we will call you to review the results.  Follow-Up: At [facility], you and your health needs are our priority.  As part of our continuing mission to provide you with exceptional heart care, we have created designated Provider Care Teams.  These Care Teams include your primary Cardiologist (physician) and Advanced Practice Providers (APPs -  Physician Assistants and Nurse Practitioners) who all work together to provide you with the care you need, when you need it.  Your next appointment:   2 week(s) with Pharm D in Lipid Clinic  6 months with APP  The format for your next appointment:   In Person  Provider:   Jari Favre, PA-C, Ronie Spies, PA-C, Robin Searing, NP, Tereso Newcomer, PA-C, or Perlie Gold, PA-C   Other Instructions You have been referred to our Pharmacist in our Lipid Clinic for cholesterol management.     1st Floor: - Lobby - Registration  - Pharmacy  - Lab - Cafe  2nd Floor: - PV Lab - Diagnostic Testing  (echo, CT, nuclear med)  3rd Floor: - Vacant  4th Floor: - TCTS (cardiothoracic surgery) - AFib Clinic - Structural Heart Clinic - Vascular Surgery  - Vascular Ultrasound  5th Floor: - [facility] Cardiology (general and EP) - Clinical Pharmacy for coumadin, hypertension, lipid, weight-loss medications, and med management appointments    Valet parking services will be available as well.

## 2023-07-22 ENCOUNTER — Encounter: Payer: Self-pay | Admitting: Internal Medicine

## 2023-07-22 LAB — BASIC METABOLIC PANEL
BUN/Creatinine Ratio: 27 (ref 12–28)
BUN: 21 mg/dL (ref 8–27)
CO2: 26 mmol/L (ref 20–29)
Calcium: 9.5 mg/dL (ref 8.7–10.3)
Chloride: 100 mmol/L (ref 96–106)
Creatinine, Ser: 0.78 mg/dL (ref 0.57–1.00)
Glucose: 98 mg/dL (ref 70–99)
Potassium: 3.9 mmol/L (ref 3.5–5.2)
Sodium: 140 mmol/L (ref 134–144)
eGFR: 81 mL/min/{1.73_m2} (ref >59)

## 2023-07-22 LAB — LIPID PANEL
Chol/HDL Ratio: 4.8 ratio — ABNORMAL HIGH (ref 0.0–4.4)
Cholesterol, Total: 207 mg/dL — ABNORMAL HIGH (ref 100–199)
HDL: 43 mg/dL (ref >39)
LDL Chol Calc (NIH): 142 mg/dL — ABNORMAL HIGH (ref 0–99)
Triglycerides: 122 mg/dL (ref 0–149)
VLDL Cholesterol Cal: 22 mg/dL (ref 5–40)

## 2023-07-22 LAB — LIPOPROTEIN A (LPA): Lipoprotein (a): 13.7 nmol/L (ref <75.0)

## 2023-07-24 ENCOUNTER — Other Ambulatory Visit: Payer: Self-pay

## 2023-07-24 DIAGNOSIS — E785 Hyperlipidemia, unspecified: Secondary | ICD-10-CM

## 2023-07-25 ENCOUNTER — Telehealth: Payer: Self-pay | Admitting: *Deleted

## 2023-07-25 ENCOUNTER — Encounter: Payer: Self-pay | Admitting: Internal Medicine

## 2023-07-25 NOTE — Telephone Encounter (Signed)
 Pt returned call about FMLA dates. Beginning dates 07/03/2023 end date 07/22/2023. Will give and send to Dr. Lynnette Caffey to complete.

## 2023-07-25 NOTE — Telephone Encounter (Signed)
 Lvm for pt to call office.  Dr. Lynnette Caffey needs beginning dates and end dates to complete FMLA paperwork.

## 2023-07-28 ENCOUNTER — Telehealth: Payer: Self-pay | Admitting: *Deleted

## 2023-07-28 NOTE — Telephone Encounter (Signed)
 Leaving signed FMLA paperwork upfront for pt to pick up. Also copy left for Leanne when she comes back tomorrow to file.

## 2023-07-28 NOTE — Telephone Encounter (Signed)
 Pt is at work so she'd like to be called back on her work phone at [phone] regarding the previous phone note below  Pt is requesting a callback regarding the status of her FMLA paperwork being filled out so that she can come in to pick it up. She stated she'd like to come on her lunch break to get it so she'd like to know for sure that the papers are ready before coming to the office. Please advise

## 2023-07-28 NOTE — Telephone Encounter (Signed)
 Pt is requesting a callback regarding the status of her FMLA paperwork being filled out so that she can come in to pick it up. She stated she'd like to come on her lunch break to get it so she'd like to know for sure that the papers are ready before coming to the office. Please advise

## 2023-07-28 NOTE — Telephone Encounter (Signed)
 FMLA paperwork completed by Dr. Lynnette Caffey. Instructions on red folder to return to [place].  Left red FMLA folder with Brien Mates in Leanne's absence today.

## 2023-07-28 NOTE — Telephone Encounter (Signed)
 Spoke with patient and she was requesting a copy of FMLA paper work.   Spoke with Dr. Lynnette Caffey and he has completed paperwork. FMLA  forms has been placed on Charlene Tapia's Desk. Being that she is out today.Duwayne Heck has place copy up front for patient.  Please see second encounter

## 2023-07-29 NOTE — Telephone Encounter (Signed)
 Faxed [employer] FLMA form to insurance and scanned into chart. Billing notified.

## 2023-07-30 ENCOUNTER — Other Ambulatory Visit: Payer: Self-pay

## 2023-07-30 MED ORDER — TORSEMIDE 20 MG PO TABS: 20.0000 mg | ORAL_TABLET | Freq: Every day | ORAL | 3 refills | Status: AC

## 2023-08-11 NOTE — Progress Notes (Unsigned)
 Electrophysiology Office Note:   Date:  08/12/2023  ID:  Charlene Tapia, DOB 08/23/1950, MRN [id number]  Primary Cardiologist: Orbie Pyo, MD Electrophysiologist: [name], MD      History of Present Illness:   Charlene Tapia is a 73 y.o. 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   Discussed the use of AI scribe software for clinical note transcription with the patient, who gave verbal consent to proceed.  History of Present Illness She has a pacemaker due to a history of intermittent complete heart block. She is monitored every three months to ensure proper function. There are no issues with the pacemaker site, including no discomfort from her bra strap. She occasionally uses a cane due to arthritis, particularly affecting her right hip, which causes her to feel unsteady. In February 2025, she was hospitalized for four days due to a respiratory syncytial virus (RSV) infection, which she describes as the worst illness she has experienced in recent memory, leading to complete respiratory failure. Since recovering, she has resumed exercising and is focused on losing weight slowly and steadily. She is engaging in gentle exercise with an exercise bicycle and is gradually increasing her activity level. She notes no issues or symptoms related to this. No new or acute complaints today.  Review of systems complete and found to be negative unless listed in HPI.   EP Information / Studies Reviewed:    EKG is not ordered today. EKG from 07/03/23 reviewed which showed AS and V paced rhythm. PR and QRS .       Echo 03/05/23:  1. Left ventricular ejection fraction, by estimation, is 65 to 70%. The  left ventricle has normal function. The left ventricle has no regional  wall motion  abnormalities. There is mild left ventricular hypertrophy.  Left ventricular diastolic parameters  are indeterminate.   2. Right ventricular systolic function is normal. The right ventricular  size is normal. Tricuspid regurgitation signal is inadequate for assessing  PA pressure.   3. The mitral valve is degenerative. Trivial mitral valve regurgitation.  Mild mitral stenosis. The mean mitral valve gradient is 4.7 mmHg with  average heart rate of 69 bpm. Severe mitral annular calcification.   4. The aortic valve has been repaired/replaced. Aortic valve  regurgitation is mild- there appears to be both posterior paravalvular  regurgitation, as well as posterior valvular regurgitation, likely mild in  total. There is a 29 mm Medtronic  CoreValve-Evolut Pro prosthetic (TAVR) valve present in the aortic  position. Procedure Date: 02/04/2023. Echo findings are consistent with  normal structure and function of the aortic valve prosthesis. Aortic valve  area, by VTI measures 3.30 cm. Aortic  valve mean gradient measures 7.0 mmHg. Aortic valve Vmax measures 1.87  m/s. Aortic valve acceleration time measures 70 msec.   5. The inferior vena cava is normal in size with <50% respiratory  variability, suggesting right atrial pressure of 8 mmHg.    Physical Exam:   VS:  BP 128/72   Pulse (!) 56   Ht 5' 2.5" (1.588 m)   Wt 233 lb 6.4 oz (105.9 kg)   SpO2 96%   BMI 42.01 kg/m    Wt Readings from Last 3 Encounters:  08/12/23 233 lb 6.4 oz (105.9 kg)  07/17/23 246 lb (111.6 kg)  07/06/23 259 lb 11.2 oz (117.8 kg)     GEN: Well nourished, well developed in no acute distress NECK: No JVD CARDIAC: Normal rate, regular rhythm. Well-healed left chest pacer pocket. RESPIRATORY:  Clear to auscultation without rales, wheezing or rhonchi  ABDOMEN: Soft, non-distended EXTREMITIES:  No edema; No deformity   ASSESSMENT AND PLAN:    #. S/p dual chamber pacemaker: #. Symptomatic bradycardia: #.  Complete heart block: #. Left bundle branch block: - In clinic pacemaker interrogation performed. Appropriate device function and stable lead parameters. Underlying rhythm now sinus with complete heart block.  - Continue scheduled remote monitoring. - Paced QRS on most recent EKG.  #. AS s/p TAVR: Well compensated.  - She is scheduled for echocardiogram on 02/04/23.  #. Hypertension -At goal today.  Recommend checking blood pressures 1-2 times per week at home and recording the values.  Recommend bringing these recordings to the primary care physician.  Follow up with Dr. Jimmey Ralph in 12 months  Signed, [name], MD

## 2023-08-12 ENCOUNTER — Ambulatory Visit: Attending: Cardiology | Admitting: Cardiology

## 2023-08-12 ENCOUNTER — Encounter: Payer: Self-pay | Admitting: Cardiology

## 2023-08-12 VITALS — BP 128/72 | HR 56 | Ht 62.5 in | Wt 233.4 lb

## 2023-08-12 DIAGNOSIS — I10 Essential (primary) hypertension: Secondary | ICD-10-CM | POA: Diagnosis present

## 2023-08-12 DIAGNOSIS — Z950 Presence of cardiac pacemaker: Secondary | ICD-10-CM | POA: Diagnosis present

## 2023-08-12 DIAGNOSIS — Z952 Presence of prosthetic heart valve: Secondary | ICD-10-CM | POA: Diagnosis present

## 2023-08-12 DIAGNOSIS — R001 Bradycardia, unspecified: Secondary | ICD-10-CM | POA: Diagnosis not present

## 2023-08-12 DIAGNOSIS — I442 Atrioventricular block, complete: Secondary | ICD-10-CM | POA: Diagnosis present

## 2023-08-12 DIAGNOSIS — I447 Left bundle-branch block, unspecified: Secondary | ICD-10-CM | POA: Insufficient documentation

## 2023-08-12 NOTE — Patient Instructions (Signed)
 Medication Instructions:  Your physician recommends that you continue on your current medications as directed. Please refer to the Current Medication list given to you today.  *If you need a refill on your cardiac medications before your next appointment, please call your pharmacy*  Follow-Up: At [facility], you and your health needs are our priority.  As part of our continuing mission to provide you with exceptional heart care, we have created designated Provider Care Teams.  These Care Teams include your primary Cardiologist (physician) and Advanced Practice Providers (APPs -  Physician Assistants and Nurse Practitioners) who all work together to provide you with the care you need, when you need it.  Your next appointment:   1 year  Provider:   You may see [name], MD or one of the following Advanced Practice Providers on your designated Care Team:   Francis Dowse, [place] [address] [place], [place] Sherie Don, NP Canary Brim, NP

## 2023-08-18 ENCOUNTER — Ambulatory Visit
Admission: RE | Admit: 2023-08-18 | Discharge: 2023-08-18 | Disposition: A | Discharge status: Home / Self Care | Payer: Medicare Other | Source: Ambulatory Visit | Attending: Nurse Practitioner | Admitting: Nurse Practitioner

## 2023-08-18 DIAGNOSIS — R590 Localized enlarged lymph nodes: Secondary | ICD-10-CM

## 2023-08-21 ENCOUNTER — Ambulatory Visit (INDEPENDENT_AMBULATORY_CARE_PROVIDER_SITE_OTHER): Payer: Medicare Other

## 2023-08-21 DIAGNOSIS — I442 Atrioventricular block, complete: Secondary | ICD-10-CM

## 2023-08-21 LAB — CUP PACEART REMOTE DEVICE CHECK
Battery Remaining Longevity: 120 mo
Battery Remaining Percentage: 95.5 %
Battery Voltage: 3.01 V
Brady Statistic AP VP Percent: 1 %
Brady Statistic AP VS Percent: 1 %
Brady Statistic AS VP Percent: 99 %
Brady Statistic AS VS Percent: 1 %
Brady Statistic RA Percent Paced: 1 %
Brady Statistic RV Percent Paced: 99 %
Date Time Interrogation Session: 20250403020031
Implantable Lead Connection Status: 753985
Implantable Lead Connection Status: 753985
Implantable Lead Implant Date: 20241001
Implantable Lead Implant Date: 20241001
Implantable Lead Location: 753859
Implantable Lead Location: 753860
Implantable Pulse Generator Implant Date: 20241001
Lead Channel Impedance Value: 400 Ohm
Lead Channel Impedance Value: 480 Ohm
Lead Channel Pacing Threshold Amplitude: 0.5 V
Lead Channel Pacing Threshold Amplitude: 0.625 V
Lead Channel Pacing Threshold Pulse Width: 0.5 ms
Lead Channel Pacing Threshold Pulse Width: 0.5 ms
Lead Channel Sensing Intrinsic Amplitude: 12 mV
Lead Channel Sensing Intrinsic Amplitude: 3.4 mV
Lead Channel Setting Pacing Amplitude: 0.875
Lead Channel Setting Pacing Amplitude: 1.5 V
Lead Channel Setting Pacing Pulse Width: 0.5 ms
Lead Channel Setting Sensing Sensitivity: 2 mV
Pulse Gen Model: 2272
Pulse Gen Serial Number: 8211315

## 2023-08-27 NOTE — Progress Notes (Unsigned)
 Patient ID: Charlene Tapia                 DOB: [birth year]                    MRN: [id number]    HPI: Charlene Tapia is a 73 y.o. female patient referred to lipid clinic by Dr. Lynnette Caffey. PMH is significant for HLD, HTN, knee OA, severe aortic stenosis s/p TAVR. She has minimal nonobstructive CAD per 11/2022 cardiac cath, coronary calcium 76, 69th percentile.  Per cardiology visit on 07/17/23, patient has many issues with muscle cramping and is reluctant to start a statin and has been for many years. She was prescribed atorvastatin in 2021 but never started taking it. Lp(a) on 07/21/23 is 13.7, not elevated. Last FLP on 07/21/23 shows LDL 142, HDL 43, TG 122, TC 207. Patient is open to discussing other options.   At today's clinic visit, patient indicated that she does not want to start taking Jardiance for her heart. She has read online about intolerable recurrent UTIs and withdrawals, as well as cost concerns (donut hole). Informed patient that UTIs are more likely to occur with those with a baseline history of frequent GU infections, as well as in patients with uncontrolled diabetes due to excess sugar in the urine. Educated there are no withdrawals. Informed patient that there is no donut hole this year and her copay would be consistent with Medicare for 2025. Provided patient with information about efficacy of Jardiance for heart protection and she has decided ultimately not to take it.  We also discussed patient's concerns with starting a statin. Her mother was on a statin in the past and had intolerable leg pains where she could not walk, and she has read a lot on the internet about the frequency of muscle pain. [name] lives with osteoarthritis and describes she gets regular painful cramping that she is worried will worsen with a statin. She has also been making significant lifestyle changes, including making healthy food choices and increasing her exercise. Explained that she likely will not get  to LDL goal with lifestyle interventions alone, and that insurance typically requires an adequate trial of a statin before they approve alternative therapy. Discussed mechanisms of action, dosing, side effects and potential decreases in LDL cholesterol with statins. Also discussed the "nocebo effect" with statins and how likelihood of SAMS is lower than what internet resources say. She is motivated to get her LDL down to goal as her aortic stenosis was a wake up call to get her health back on track. She is agreeable to trialing a low dose rosuvastatin dosed 3x weekly today. She is comfortable with every other day dosing and uses a pillbox to manage her medications, has great adherence.   Current Medications: none Intolerances: none Risk Factors: age > 86, HTN, CAD w/ coronary calcium 76, 69th percentile LDL-C goal: < 70 mg/dl  ApoB goal: < 80 mg/dl  Diet: 1 coffee/day, lots of water, no soft drinks Banana, oatmeal, walnuts, cheese/chicken sandwich, limits bread, more veggies, chicken salads for dinner, fruits for snacks (apples, grapes, strawberries), never eats fried food. She rarely eats chips and will measure out a single serving size.   Exercise: mobility limited by knee OA pain, slowly increasing exercise   Family History:  Father - Brayton Caves B. Joanne Gavel Metallurgist) Alcohol abuse  Heart disease  Hypertension     Social History: Never smoker, no smokeless tobacco. She reports current alcohol use. No illicit  drug use.  Labs: Lipid Panel     Component Value Date/Time   CHOL 207 (H) 07/21/2023 1518   TRIG 122 07/21/2023 1518   HDL 43 07/21/2023 1518   CHOLHDL 4.8 (H) 07/21/2023 1518   CHOLHDL 4 10/24/2022 0811   VLDL 17.6 10/24/2022 0811   LDLCALC 142 (H) 07/21/2023 1518   LABVLDL 22 07/21/2023 1518    Past Medical History:  Diagnosis Date   Allergy    High cholesterol    HTN (hypertension) 10/18/2022   Osteoarthritis    "knees; thumbs" (10/01/2013)   S/P TAVR (transcatheter aortic  valve replacement) 02/04/2023   s/p TAVR with a 29 mm Medtronic Evolut FX via the TF approach by Dr. Lynnette Caffey and Dr. Leafy Ro   Severe aortic stenosis     Current Outpatient Medications on File Prior to Visit  Medication Sig Dispense Refill   aspirin EC 81 MG tablet Take 1 tablet (81 mg total) by mouth daily. Swallow whole. 120 tablet 3   fluticasone (FLONASE) 50 MCG/ACT nasal spray Place 2 sprays into both nostrils daily as needed for allergies or rhinitis.     Ipratropium-Albuterol (COMBIVENT) 20-100 MCG/ACT AERS respimat Inhale 2 puffs into the lungs every 6 (six) hours as needed for wheezing or shortness of breath. 4 g 1   losartan (COZAAR) 25 MG tablet Take 1 tablet (25 mg total) by mouth daily. 90 tablet 3   metoprolol succinate (TOPROL XL) 25 MG 24 hr tablet Take 0.5 tablets (12.5 mg total) by mouth at bedtime. 45 tablet 3   Multiple Vitamins-Minerals (MULTIVITAMIN GUMMIES WOMENS PO) Take 2 each by mouth daily after supper.     polyethylene glycol powder (GLYCOLAX/MIRALAX) 17 GM/SCOOP powder Take 17 g by mouth daily as needed for mild constipation. 238 g 0   spironolactone (ALDACTONE) 25 MG tablet Take 1 tablet (25 mg total) by mouth daily. 90 tablet 3   torsemide (DEMADEX) 20 MG tablet Take 1 tablet (20 mg total) by mouth daily. 90 tablet 3   No current facility-administered medications on file prior to visit.    Allergies  Allergen Reactions   Phisohex [Hexachlorophene] Rash    Blistering rash    Assessment/Plan:  1. Hyperlipidemia -  Hyperlipidemia Assessment:  Patient has not tried any statins due to concerns about worsening myopathy with her history of osteoarthritis-related cramping.  Patient has made significant improvements in lifestyle including a balanced diet and increasing her exercise with walking. LDL 142 at not at goal of LDL < 70 mg/dl  Candidate for low-dose statin with augmented dosing per patient preference in order to reduce likelihood of SAMS.   Plan:   Initiate rosuvastatin 5 mg every other day at bedtime.  Encouraged patient to follow up on MyChart with any concerning symptoms or worsening of muscle cramping. If intolerable, may consider alternative lipid-lowering agents such as PCKS9 inhibitors. Provided patient tolerates lower/augmented dosing, would consider increasing rosuvastatin as tolerated to achieve LDL goal < 70 mg/dl.  Repeat labs in 3 months.  Thank you,  Lendon Ka, PharmD Candidate [facility] Extern 08/29/23  Olene Floss, Pharm.D, BCACP, CPP [facility] A Division of [facility] [address], [place], [place] [address]  Phone: [phone]; Fax: [phone]

## 2023-08-29 ENCOUNTER — Ambulatory Visit: Payer: Medicare Other | Attending: Cardiology | Admitting: Pharmacist

## 2023-08-29 DIAGNOSIS — E785 Hyperlipidemia, unspecified: Secondary | ICD-10-CM

## 2023-08-29 MED ORDER — ROSUVASTATIN CALCIUM 5 MG PO TABS: 5.0000 mg | ORAL_TABLET | ORAL | 3 refills | Status: DC

## 2023-08-29 NOTE — Patient Instructions (Signed)
 START taking rosuvastatin 5 mg every other day at night.   Continue to be a rock star with healthy eating and exercise!   Please contact us with any questions or concerns about your new medication via MyChart.

## 2023-08-29 NOTE — Assessment & Plan Note (Addendum)
 Assessment:  Patient has not tried any statins due to concerns about worsening myopathy with her history of osteoarthritis-related cramping.  Patient has made significant improvements in lifestyle including a balanced diet and increasing her exercise with walking. LDL 142 at not at goal of LDL < 70 mg/dl  Candidate for low-dose statin with augmented dosing per patient preference in order to reduce likelihood of SAMS.   Plan:  Initiate rosuvastatin 5 mg every other day at bedtime.  Encouraged patient to follow up on MyChart with any concerning symptoms or worsening of muscle cramping. If intolerable, may consider alternative lipid-lowering agents such as PCKS9 inhibitors. Provided patient tolerates lower/augmented dosing, would consider increasing rosuvastatin as tolerated to achieve LDL goal < 70 mg/dl.  Repeat labs in 3 months.

## 2023-10-01 NOTE — Progress Notes (Signed)
 Remote pacemaker transmission.

## 2023-11-20 ENCOUNTER — Ambulatory Visit (INDEPENDENT_AMBULATORY_CARE_PROVIDER_SITE_OTHER): Payer: Medicare Other

## 2023-11-20 DIAGNOSIS — I442 Atrioventricular block, complete: Secondary | ICD-10-CM

## 2023-12-08 ENCOUNTER — Ambulatory Visit: Payer: Self-pay | Admitting: Cardiology

## 2023-12-08 LAB — CUP PACEART REMOTE DEVICE CHECK
Battery Remaining Longevity: 114 mo
Battery Remaining Percentage: 95 %
Battery Voltage: 3.01 V
Brady Statistic AP VP Percent: 1 %
Brady Statistic AP VS Percent: 1 %
Brady Statistic AS VP Percent: 99 %
Brady Statistic AS VS Percent: 1 %
Brady Statistic RA Percent Paced: 1 %
Brady Statistic RV Percent Paced: 99 %
Date Time Interrogation Session: 20250703020030
Implantable Lead Connection Status: 753985
Implantable Lead Connection Status: 753985
Implantable Lead Implant Date: 20241001
Implantable Lead Implant Date: 20241001
Implantable Lead Location: 753859
Implantable Lead Location: 753860
Implantable Pulse Generator Implant Date: 20241001
Lead Channel Impedance Value: 390 Ohm
Lead Channel Impedance Value: 450 Ohm
Lead Channel Pacing Threshold Amplitude: 0.5 V
Lead Channel Pacing Threshold Amplitude: 0.625 V
Lead Channel Pacing Threshold Pulse Width: 0.5 ms
Lead Channel Pacing Threshold Pulse Width: 0.5 ms
Lead Channel Sensing Intrinsic Amplitude: 12 mV
Lead Channel Sensing Intrinsic Amplitude: 2.8 mV
Lead Channel Setting Pacing Amplitude: 0.875
Lead Channel Setting Pacing Amplitude: 1.5 V
Lead Channel Setting Pacing Pulse Width: 0.5 ms
Lead Channel Setting Sensing Sensitivity: 2 mV
Pulse Gen Model: 2272
Pulse Gen Serial Number: 8211315

## 2023-12-15 ENCOUNTER — Encounter: Payer: Self-pay | Admitting: Pharmacist

## 2024-01-06 ENCOUNTER — Other Ambulatory Visit (HOSPITAL_COMMUNITY): Payer: Self-pay

## 2024-02-01 NOTE — Progress Notes (Unsigned)
 [facility]   MULTIDISCIPLINARY HEART VALVE CLINIC                                     Cardiology Office Note:    Date:  02/03/2024   ID:  Charlene Tapia, DOB 11/26/1950, MRN [id number]  PCP:  Elnor Lauraine BRAVO, NP  [facility] Cardiologist:  Arun K Thukkani, MD  [facility] Structural heart: Lurena MARLA Red, MD [facility] Electrophysiologist:  Fonda Kitty, MD   Referring MD: Elnor Lauraine BRAVO, NP   1 year s/p TAVR  History of Present Illness:    Charlene Tapia is a 73 y.o. female with a hx of HTN, morbid obesity (BMI 48), and severe AS with moderate AI s/p TAVR (02/04/23) who presents to clinic for follow up.    Noted have a murmur on exam by PCP. Echo 11/20/22 showed EF 60% and severe mean grad 49 mmHg, AVA 1.02 cm2 (felt to be over-estimated based on an inaccurate LVOT diameter), moderate AI, moderate MS/mild MR. [facility] 11/25/22 showed minimal CAD. S/p TAVR with a 29 mm Evolut FX THV via the TF approach on 02/04/23. Post operative echo with hyperdynamic LV at 70-75% with moderate MS and stable TAVR valve function with a mean gradient at 17.88mmHg, 31.32mmHg, and AVA by VTI 1.32cm2. She was started on ASA 81mg  daily, Toprol  12.5mg , and Lasix  20mg  daily. Developed new LBBB and delayed CHB s/p dual chamber pacemaker with left bundle lead placement 02/18/23. Pre TAVR scans showed non specific lymphadenopathy that has remained stable and followed by her PCP.  Admitted for RSV complicated by heart failure in 06/2023.    Today the patient presents to clinic for follow up. Here alone. Works in [employer] at [employer] and it has been so stressful. No CP or SOB. No LE edema, orthopnea or PND. Some mild dizziness in the mornings but no syncope. No blood in stool or urine. No palpitations. She is basically very sedentary due to her job which she is thinking about retiring soon. Wants to become more active. She really would like to participate in cardiac rehab.    Past Medical History:  Diagnosis  Date   Allergy    High cholesterol    HTN (hypertension) 10/18/2022   Osteoarthritis    knees; thumbs (10/01/2013)   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 Red and Dr. Maryjane   Severe aortic stenosis      Current Medications: Current Meds  Medication Sig   aspirin  EC 81 MG tablet Take 1 tablet (81 mg total) by mouth daily. Swallow whole.   fluticasone  (FLONASE ) 50 MCG/ACT nasal spray Place 2 sprays into both nostrils daily as needed for allergies or rhinitis.   Ipratropium-Albuterol  (COMBIVENT) 20-100 MCG/ACT AERS respimat Inhale 2 puffs into the lungs every 6 (six) hours as needed for wheezing or shortness of breath.   losartan  (COZAAR ) 25 MG tablet Take 1 tablet (25 mg total) by mouth daily.   Multiple Vitamins-Minerals (MULTIVITAMIN GUMMIES WOMENS PO) Take 2 each by mouth daily after supper.   spironolactone  (ALDACTONE ) 25 MG tablet Take 1 tablet (25 mg total) by mouth daily.   torsemide  (DEMADEX ) 20 MG tablet Take 1 tablet (20 mg total) by mouth daily.   [DISCONTINUED] metoprolol  succinate (TOPROL  XL) 25 MG 24 hr tablet Take  0.5 tablets (12.5 mg total) by mouth at bedtime.   [DISCONTINUED] rosuvastatin  (CRESTOR ) 5 MG tablet Take 1 tablet (5 mg total) by mouth every other day.      ROS:   Please see the history of present illness.    All other systems reviewed and are negative.  EKGs       Risk Assessment/Calculations:           Physical Exam:    VS:  BP (!) 148/68   Pulse 75   Ht 5' 2.5 (1.588 m)   Wt 265 lb 12.8 oz (120.6 kg)   SpO2 90%   BMI 47.84 kg/m     Wt Readings from Last 3 Encounters:  02/02/24 265 lb 12.8 oz (120.6 kg)  08/12/23 233 lb 6.4 oz (105.9 kg)  07/17/23 246 lb (111.6 kg)     GEN: Well nourished, well developed in no acute distress NECK: No JVD CARDIAC: RRR, 2/6 flow murmur @ RUSB. No rubs, gallops RESPIRATORY:  Clear to auscultation  without rales, wheezing or rhonchi  ABDOMEN: Soft, non-tender, non-distended EXTREMITIES:  No edema; No deformity.    ASSESSMENT:    1. S/P TAVR (transcatheter aortic valve replacement)   2. Nonrheumatic mitral valve stenosis   3. Pacemaker   4. Chronic heart failure with preserved ejection fraction (HCC)   5. Primary hypertension   6. Obesity, morbid, BMI 50 or higher (HCC)     PLAN:    In order of problems listed above:  Severe AS s/p TAVR:  -- Echo today shows EF 65%, mild LVH, normally functioning TAVR with a mean gradient of 8 mm hg and mild PVL.  -- NYHA class I symptoms, but not very active.  -- Continue Aspirin  81mg  daily.  -- She understands the need for lifelong SBE prophylaxis. -- Dr. Wendel has sent in a referral for cardiac rehab, which will be very good for her.  -- Continue regular follow up with Dr. Wendel  Mitral stenosis: -- Severe MAC with mod MS with a mean gradient of 8 mm hg.  -- Continue medical therapy.    S/p PPM:  -- Followed by Dr. Kennyth.  Chronic HFpEF:  -- Appears euvolemic.  -- Continue torsemide  20 mg daily, losartan  25 mg daily, and spironolactone  25 mg daily.  -- Increase Toprol -XL 12.5 mg to 25mg  daily given elevated BPs today.  -- Did not start Jardiance  10 mg daily due to worry for GU infections and cost.   HTN:  -- BP mildly elevated today at 148/68. -- Continue torsemide  20 mg daily, losartan  25 mg daily, and spironolactone  25 mg daily.  -- Increase Toprol -XL 12.5 mg to 25mg  daily.   HLD: -- LDL 142 on 07/21/23 -- LP (a) normal. -- Started on Crestor  5mg  daily.  -- Due for follow up lipids, which she is going down to lab to get today.   Morbid obesity:   -- Body mass index is 47.84 kg/m. -- Work on diet and exercise.  -- Referral sent to cardiac rehab. She wants to focus on self care and exercise and reduced anxiety (may retire soon).     Medication Adjustments/Labs and Tests Ordered: Current medicines are reviewed at  length with the patient today.  Concerns regarding medicines are outlined above.  No orders of the defined types were placed in this encounter.  Meds ordered this encounter  Medications   metoprolol  succinate (TOPROL  XL) 25 MG 24 hr tablet    Sig: Take 1 tablet (25  mg total) by mouth at bedtime.    Patient Instructions  Medication Instructions:  Your physician has recommended you make the following change in your medication: INCREASE Metoprolol  from 12.5mg  to 25mg  daily (Take 1 whole tab instead of 1/2 a tablet) *If you need a refill on your cardiac medications before your next appointment, please call your pharmacy*  Lab Work: None needed If you have labs (blood work) drawn today and your tests are completely normal, you will receive your results only by: MyChart Message (if you have MyChart) OR A paper copy in the mail If you have any lab test that is abnormal or we need to change your treatment, we will call you to review the results.  Testing/Procedures: None needed  Follow-Up: At [facility], you and your health needs are our priority.  As part of our continuing mission to provide you with exceptional heart care, our providers are all part of one team.  This team includes your primary Cardiologist (physician) and Advanced Practice Providers or APPs (Physician Assistants and Nurse Practitioners) who all work together to provide you with the care you need, when you need it.  Your next appointment:   6 month(s)  Provider:   Lurena Red, MD    We recommend signing up for the patient portal called MyChart.  Sign up information is provided on this After Visit Summary.  MyChart is used to connect with patients for Virtual Visits (Telemedicine).  Patients are able to view lab/test results, encounter notes, upcoming appointments, etc.  Non-urgent messages can be sent to your provider as well.   To learn more about what you can do with MyChart, go to  [email or web address].           Signed, Lamarr Hummer, PA-C  02/03/2024 3:45 PM    [facility]

## 2024-02-02 ENCOUNTER — Encounter (HOSPITAL_COMMUNITY): Payer: Self-pay

## 2024-02-02 ENCOUNTER — Other Ambulatory Visit (HOSPITAL_COMMUNITY): Payer: Medicare Other

## 2024-02-02 ENCOUNTER — Ambulatory Visit (INDEPENDENT_AMBULATORY_CARE_PROVIDER_SITE_OTHER): Payer: Medicare Other | Admitting: Physician Assistant

## 2024-02-02 ENCOUNTER — Ambulatory Visit: Payer: Self-pay | Admitting: Physician Assistant

## 2024-02-02 ENCOUNTER — Other Ambulatory Visit: Payer: Self-pay | Admitting: Internal Medicine

## 2024-02-02 ENCOUNTER — Ambulatory Visit (HOSPITAL_COMMUNITY)
Admission: RE | Admit: 2024-02-02 | Discharge: 2024-02-02 | Disposition: A | Discharge status: Home / Self Care | Source: Ambulatory Visit | Attending: Cardiology | Admitting: Cardiology

## 2024-02-02 VITALS — BP 148/68 | HR 75 | Ht 62.5 in | Wt 265.8 lb

## 2024-02-02 DIAGNOSIS — I342 Nonrheumatic mitral (valve) stenosis: Secondary | ICD-10-CM | POA: Insufficient documentation

## 2024-02-02 DIAGNOSIS — I10 Essential (primary) hypertension: Secondary | ICD-10-CM | POA: Diagnosis not present

## 2024-02-02 DIAGNOSIS — Z950 Presence of cardiac pacemaker: Secondary | ICD-10-CM

## 2024-02-02 DIAGNOSIS — I5032 Chronic diastolic (congestive) heart failure: Secondary | ICD-10-CM | POA: Diagnosis present

## 2024-02-02 DIAGNOSIS — Z952 Presence of prosthetic heart valve: Secondary | ICD-10-CM

## 2024-02-02 DIAGNOSIS — I350 Nonrheumatic aortic (valve) stenosis: Secondary | ICD-10-CM | POA: Insufficient documentation

## 2024-02-02 LAB — ECHOCARDIOGRAM COMPLETE
AR max vel: 2.83 cm2
AV Area VTI: 3.08 cm2
AV Area mean vel: 2.79 cm2
AV Mean grad: 8 mmHg
AV Peak grad: 16.2 mmHg
Ao pk vel: 2.01 m/s
Area-P 1/2: 2.84 cm2
MV VTI: 2.39 cm2
S' Lateral: 2.7 cm

## 2024-02-02 MED ORDER — METOPROLOL SUCCINATE ER 25 MG PO TB24: 25.0000 mg | ORAL_TABLET | Freq: Every day | ORAL | Status: DC

## 2024-02-02 NOTE — Patient Instructions (Signed)
 Medication Instructions:  Your physician has recommended you make the following change in your medication: INCREASE Metoprolol  from 12.5mg  to 25mg  daily (Take 1 whole tab instead of 1/2 a tablet) *If you need a refill on your cardiac medications before your next appointment, please call your pharmacy*  Lab Work: None needed If you have labs (blood work) drawn today and your tests are completely normal, you will receive your results only by: MyChart Message (if you have MyChart) OR A paper copy in the mail If you have any lab test that is abnormal or we need to change your treatment, we will call you to review the results.  Testing/Procedures: None needed  Follow-Up: At [facility], you and your health needs are our priority.  As part of our continuing mission to provide you with exceptional heart care, our providers are all part of one team.  This team includes your primary Cardiologist (physician) and Advanced Practice Providers or APPs (Physician Assistants and Nurse Practitioners) who all work together to provide you with the care you need, when you need it.  Your next appointment:   6 month(s)  Provider:   Lurena Red, MD    We recommend signing up for the patient portal called MyChart.  Sign up information is provided on this After Visit Summary.  MyChart is used to connect with patients for Virtual Visits (Telemedicine).  Patients are able to view lab/test results, encounter notes, upcoming appointments, etc.  Non-urgent messages can be sent to your provider as well.   To learn more about what you can do with MyChart, go to [email or web address].

## 2024-02-03 ENCOUNTER — Ambulatory Visit: Payer: Self-pay | Admitting: Pharmacist

## 2024-02-03 DIAGNOSIS — E785 Hyperlipidemia, unspecified: Secondary | ICD-10-CM

## 2024-02-03 LAB — LIPID PANEL
Chol/HDL Ratio: 3.1 ratio (ref 0.0–4.4)
Cholesterol, Total: 151 mg/dL (ref 100–199)
HDL: 48 mg/dL (ref >39)
LDL Chol Calc (NIH): 85 mg/dL (ref 0–99)
Triglycerides: 94 mg/dL (ref 0–149)
VLDL Cholesterol Cal: 18 mg/dL (ref 5–40)

## 2024-02-03 MED ORDER — ROSUVASTATIN CALCIUM 5 MG PO TABS: 5.0000 mg | ORAL_TABLET | ORAL | 3 refills | Status: DC

## 2024-02-04 ENCOUNTER — Other Ambulatory Visit (HOSPITAL_COMMUNITY): Payer: Medicare Other

## 2024-02-04 ENCOUNTER — Telehealth (HOSPITAL_COMMUNITY): Payer: Self-pay

## 2024-02-04 NOTE — Telephone Encounter (Signed)
 Attempted to call patient regarding cardiac rehab, her husband answered and told me to call her work number. 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  Sent MyChart message.

## 2024-02-04 NOTE — Telephone Encounter (Signed)
 Pt insurance is active and benefits verified through Medicare B. Co-pay $0, DED $257/$257 met, out of pocket $0/$0 met, co-insurance 20%. No pre-authorization required. Passport, 02/04/2024 @ 8:54am, REF# [id number].  2ndary insurance is active and benefits verified through [employer]. Co-pay $0, DED $0/$0 met, out of pocket $0/$0 met, co-insurance 0%. No pre-authorization required.   TCR/ICR? ICR Visit(date of service)limitation? No Can multiple codes be used on the same date of service/visit?(IF ITS A LIMIT) N/A  Is this a lifetime maximum or an annual maximum? Annual Has the member used any of these services to date? No Is there a time limit (weeks/months) on start of program and/or program completion? No

## 2024-02-06 ENCOUNTER — Encounter (HOSPITAL_COMMUNITY): Payer: Self-pay

## 2024-02-26 NOTE — Progress Notes (Signed)
 Remote PPM Transmission

## 2024-03-02 ENCOUNTER — Encounter (HOSPITAL_COMMUNITY)
Admission: RE | Admit: 2024-03-02 | Discharge: 2024-03-02 | Disposition: A | Discharge status: Home / Self Care | Source: Ambulatory Visit | Attending: Internal Medicine | Admitting: Internal Medicine

## 2024-03-02 ENCOUNTER — Encounter (HOSPITAL_COMMUNITY): Payer: Self-pay

## 2024-03-02 VITALS — BP 106/64 | HR 74 | Ht 61.75 in | Wt 269.0 lb

## 2024-03-02 DIAGNOSIS — Z952 Presence of prosthetic heart valve: Secondary | ICD-10-CM | POA: Diagnosis present

## 2024-03-02 NOTE — Progress Notes (Signed)
 Cardiac Individual Treatment Plan  Patient Details  Name: Charlene Tapia MRN: [id number] Date of Birth: 1950/09/05 Referring Provider:   Flowsheet Row INTENSIVE CARDIAC REHAB ORIENT from 03/02/2024 in [facility]  Referring Provider Vina Gull, MD    Initial Encounter Date:  Flowsheet Row INTENSIVE CARDIAC REHAB ORIENT from 03/02/2024 in [facility]  Date 03/02/24    Visit Diagnosis: 02/04/23 S/P TAVR (transcatheter aortic valve replacement)  Patient's Home Medications on Admission:  Current Outpatient Medications:    aspirin  EC 81 MG tablet, Take 1 tablet (81 mg total) by mouth daily. Swallow whole., Disp: 120 tablet, Rfl: 3   fluticasone  (FLONASE ) 50 MCG/ACT nasal spray, Place 2 sprays into both nostrils daily as needed for allergies or rhinitis., Disp: , Rfl:    Ipratropium-Albuterol  (COMBIVENT) 20-100 MCG/ACT AERS respimat, Inhale 2 puffs into the lungs every 6 (six) hours as needed for wheezing or shortness of breath., Disp: 4 g, Rfl: 1   losartan  (COZAAR ) 25 MG tablet, Take 1 tablet (25 mg total) by mouth daily., Disp: 90 tablet, Rfl: 3   metoprolol  succinate (TOPROL  XL) 25 MG 24 hr tablet, Take 1 tablet (25 mg total) by mouth at bedtime., Disp: , Rfl:    Multiple Vitamins-Minerals (MULTIVITAMIN GUMMIES WOMENS PO), Take 2 each by mouth daily after supper., Disp: , Rfl:    rosuvastatin  (CRESTOR ) 5 MG tablet, Take 1 tablet (5 mg total) by mouth every other day. 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 Wendel and Dr. Maryjane   Severe aortic stenosis     Tobacco Use: Social History   Tobacco Use  Smoking Status Never  Smokeless Tobacco Never    Labs: Review Flowsheet  More data exists      Latest Ref Rng & Units 11/25/2022 02/04/2023 07/03/2023 07/21/2023 02/02/2024  Labs for ITP Cardiac and Pulmonary Rehab  Cholestrol 100 - 199 mg/dL - - - 792  848   LDL (calc) 0 - 99 mg/dL - - - 857  85   HDL-C >60 mg/dL - - - 43  48   Trlycerides 0 - 149 mg/dL - - - 877  94   PH, Arterial 7.35 - 7.45 7.366  - - - -  PCO2 arterial 32 - 48 mmHg 45.2  - - - -  Bicarbonate 20.0 - 28.0 mmol/L 19.0  26.1  25.9  - - - -  TCO2 22 - 32 mmol/L 20  28  27  23  24   - -  Acid-base deficit 0.0 - 2.0 mmol/L 8.0  - - - -  O2 Saturation % 73  77  92  - - - -    Details       Multiple values from one day are sorted in reverse-chronological order  Capillary Blood Glucose: Lab Results  Component Value Date   GLUCAP 131 (H) 08/17/2013     Exercise Target Goals: Exercise Program Goal: Individual exercise prescription set using results from initial 6 min walk test and THRR while considering  patient's activity barriers and safety.   Exercise Prescription Goal: Initial exercise prescription builds to 30-45 minutes a day of aerobic activity, 2-3 days per week.  Home exercise guidelines will be given to patient during program as part of exercise prescription that the participant will acknowledge.  Activity Barriers & Risk Stratification:  Activity Barriers & Cardiac Risk Stratification - 03/02/24 1411       Activity Barriers & Cardiac Risk Stratification   Activity Barriers Shortness of Breath;Muscular Weakness;Deconditioning;Left Knee Replacement;Right Knee Replacement;Arthritis;Balance  Concerns;History of Falls;Assistive Device    Cardiac Risk Stratification High          6 Minute Walk:  6 Minute Walk     Row Name 03/02/24 1540         6 Minute Walk   Phase Initial     Distance 955 feet     Walk Time 6 minutes     # of Rest Breaks 0     MPH 1.8     METS 1.2     RPE 11     Perceived Dyspnea  1     VO2 Peak 3.6     Symptoms Yes (comment)     Comments Mild SOB, RPD =1 resolved with rest     Resting HR 74 bpm     Resting BP 106/64     Resting Oxygen Saturation  95 %     Exercise Oxygen Saturation  during 6 min walk 95 %     Max Ex. HR 85 bpm     Max Ex. BP 152/76     2 Minute Post BP 130/64        Oxygen Initial Assessment:   Oxygen Re-Evaluation:   Oxygen Discharge (Final Oxygen Re-Evaluation):   Initial Exercise Prescription:  Initial Exercise Prescription - 03/02/24 1500       Date of Initial Exercise RX and Referring Provider   Date 03/02/24    Referring Provider Vina Gull, MD    Expected Discharge Date 05/26/24      NuStep   Level 1    SPM 75    Minutes 25    METs 1.2      Prescription Details   Frequency (times per week) 3    Duration Progress to 30 minutes of continuous aerobic without signs/symptoms of physical distress      Intensity   THRR 40-80% of Max Heartrate 60-119    Ratings of Perceived Exertion 11-13    Perceived Dyspnea 0-4      Progression   Progression Continue progressive overload as per policy without signs/symptoms or physical distress.      Resistance Training   Training Prescription Yes    Weight 2 lbs    Reps 10-15          Perform Capillary Blood Glucose checks as needed.  Exercise Prescription Changes:   Exercise Comments:   Exercise Goals and Review:   Exercise Goals     Row Name 03/02/24 1327             Exercise Goals   Increase Physical Activity Yes       Intervention Provide advice, education, support and counseling about physical activity/exercise needs.;Develop an  individualized exercise prescription for aerobic and  resistive training based on initial evaluation findings, risk stratification, comorbidities and participant's personal goals.       Expected Outcomes Short Term: Attend rehab on a regular basis to increase amount of physical activity.;Long Term: Add in home exercise to make exercise part of routine and to increase amount of physical activity.;Long Term: Exercising regularly at least 3-5 days a week.       Increase Strength and Stamina Yes       Intervention Provide advice, education, support and counseling about physical activity/exercise needs.;Develop an individualized exercise prescription for aerobic and resistive training based on initial evaluation findings, risk stratification, comorbidities and participant's personal goals.       Expected Outcomes Short Term: Increase workloads from initial exercise prescription for resistance, speed, and METs.;Short Term: Perform resistance training exercises routinely during rehab and add in resistance training at home;Long Term: Improve cardiorespiratory fitness, muscular endurance and strength as measured by increased METs and functional capacity ( )       Able to understand and use rate of perceived exertion (RPE) scale Yes       Intervention Provide education and explanation on how to use RPE scale       Expected Outcomes Short Term: Able to use RPE daily in rehab to express subjective intensity level;Long Term:  Able to use RPE to guide intensity level when exercising independently       Knowledge and understanding of Target Heart Rate Range (THRR) Yes       Intervention Provide education and explanation of THRR including how the numbers were predicted and where they are located for reference       Expected Outcomes Short Term: Able to state/look up THRR;Short Term: Able to use daily as guideline for intensity in rehab;Long Term: Able to use THRR to govern intensity when exercising independently        Understanding of Exercise Prescription Yes       Intervention Provide education, explanation, and written materials on patient's individual exercise prescription       Expected Outcomes Short Term: Able to explain program exercise prescription;Long Term: Able to explain home exercise prescription to exercise independently          Exercise Goals Re-Evaluation :   Discharge Exercise Prescription (Final Exercise Prescription Changes):   Nutrition:  Target Goals: Understanding of nutrition guidelines, daily intake of sodium 1500mg , cholesterol 200mg , calories 30% from fat and 7% or less from saturated fats, daily to have 5 or more servings of fruits and vegetables.  Biometrics:  Pre Biometrics - 03/02/24 1400       Pre Biometrics   Waist Circumference 56.5 inches    Hip Circumference 54.5 inches    Waist to Hip Ratio 1.04 %    Triceps Skinfold 42 mm    % Body Fat 62 %    Grip Strength 19 kg    Flexibility 9 in    Single Leg Stand --   No perfromed, pt refused: afraid of falling          Nutrition Therapy Plan and Nutrition Goals:   Nutrition Assessments:  MEDIFICTS Score Key: >=70 Need to make dietary changes  40-70 Heart Healthy Diet <= 40 Therapeutic Level Cholesterol Diet    Picture Your Plate Scores: <59 Unhealthy dietary pattern with much room for improvement. 41-50 Dietary pattern unlikely to meet recommendations for good health and room for improvement. 51-60 More healthful dietary pattern, with some room for improvement.  >60 Healthy dietary pattern, although there  may be some specific behaviors that could be improved.    Nutrition Goals Re-Evaluation:   Nutrition Goals Re-Evaluation:   Nutrition Goals Discharge (Final Nutrition Goals Re-Evaluation):   Psychosocial: Target Goals: Acknowledge presence or absence of significant depression and/or stress, maximize coping skills, provide positive support system. Participant is able to verbalize types  and ability to use techniques and skills needed for reducing stress and depression.  Initial Review & Psychosocial Screening:  Initial Psych Review & Screening - 03/02/24 1403       Initial Review   Current issues with None Identified      Family Dynamics   Good Support System? Yes   Marval has her husband for support     Barriers   Psychosocial barriers to participate in program There are no identifiable barriers or psychosocial needs.      Screening Interventions   Interventions Encouraged to exercise          Quality of Life Scores:  Quality of Life - 03/02/24 1407       Quality of Life   Select Quality of Life      Quality of Life Scores   Health/Function Pre 20.7 %    Socioeconomic Pre 30 %    Psych/Spiritual Pre 26.64 %    Family Pre 28.5 %    GLOBAL Pre 24.67 %         Scores of 19 and below usually indicate a poorer quality of life in these areas.  A difference of  2-3 points is a clinically meaningful difference.  A difference of 2-3 points in the total score of the Quality of Life Index has been associated with significant improvement in overall quality of life, self-image, physical symptoms, and general health in studies assessing change in quality of life.  PHQ-9: Review Flowsheet  More data exists      03/02/2024 02/25/2023 11/01/2022 10/18/2022 11/12/2021  Depression screen PHQ 2/9  Decreased Interest 0 0 0 0 0  Down, Depressed, Hopeless 0 0 0 0 0  PHQ - 2 Score 0 0 0 0 0  Altered sleeping 0 0 - - 0  Tired, decreased energy 0 0 - - 0  Change in appetite 3 0 - - 0  Feeling bad or failure about yourself  0 0 - - 0  Trouble concentrating 0 0 - - 0  Moving slowly or fidgety/restless 0 0 - - 0  Suicidal thoughts 0 0 - - 0  PHQ-9 Score 3 0 - - 0  Difficult doing work/chores Not difficult at all Not difficult at all - - Not difficult at all   Interpretation of Total Score  Total Score Depression Severity:  1-4 = Minimal depression, 5-9 = Mild  depression, 10-14 = Moderate depression, 15-19 = Moderately severe depression, 20-27 = Severe depression   Psychosocial Evaluation and Intervention:   Psychosocial Re-Evaluation:   Psychosocial Discharge (Final Psychosocial Re-Evaluation):   Vocational Rehabilitation: Provide vocational rehab assistance to qualifying candidates.   Vocational Rehab Evaluation & Intervention:  Vocational Rehab - 03/02/24 1403       Initial Vocational Rehab Evaluation & Intervention   Assessment shows need for Vocational Rehabilitation No   Marval is recently retired and does not need vocational rehab at this time         Education: Education Goals: Education classes will be provided on a weekly basis, covering required topics. Participant will state understanding/return demonstration of topics presented.     Core Videos: Exercise  Move It!  Clinical staff conducted group or individual video education with verbal and written material and guidebook.  Patient learns the recommended Pritikin exercise program. Exercise with the goal of living a long, healthy life. Some of the health benefits of exercise include controlled diabetes, healthier blood pressure levels, improved cholesterol levels, improved heart and lung capacity, improved sleep, and better body composition. Everyone should speak with their doctor before starting or changing an exercise routine.  Biomechanical Limitations Clinical staff conducted group or individual video education with verbal and written material and guidebook.  Patient learns how biomechanical limitations can impact exercise and how we can mitigate and possibly overcome limitations to have an impactful and balanced exercise routine.  Body Composition Clinical staff conducted group or individual video education with verbal and written material and guidebook.  Patient learns that body composition (ratio of muscle mass to fat mass) is a key component to assessing  overall fitness, rather than body weight alone. Increased fat mass, especially visceral belly fat, can put us  at increased risk for metabolic syndrome, type 2 diabetes, heart disease, and even death. It is recommended to combine diet and exercise (cardiovascular and resistance training) to improve your body composition. Seek guidance from your physician and exercise physiologist before implementing an exercise routine.  Exercise Action Plan Clinical staff conducted group or individual video education with verbal and written material and guidebook.  Patient learns the recommended strategies to achieve and enjoy long-term exercise adherence, including variety, self-motivation, self-efficacy, and positive decision making. Benefits of exercise include fitness, good health, weight management, more energy, better sleep, less stress, and overall well-being.  Medical   Heart Disease Risk Reduction Clinical staff conducted group or individual video education with verbal and written material and guidebook.  Patient learns our heart is our most vital organ as it circulates oxygen, nutrients, white blood cells, and hormones throughout the entire body, and carries waste away. Data supports a plant-based eating plan like the Pritikin Program for its effectiveness in slowing progression of and reversing heart disease. The video provides a number of recommendations to address heart disease.   Metabolic Syndrome and Belly Fat  Clinical staff conducted group or individual video education with verbal and written material and guidebook.  Patient learns what metabolic syndrome is, how it leads to heart disease, and how one can reverse it and keep it from coming back. You have metabolic syndrome if you have 3 of the following 5 criteria: abdominal obesity, high blood pressure, high triglycerides, low HDL cholesterol, and high blood sugar.  Hypertension and Heart Disease Clinical staff conducted group or individual video  education with verbal and written material and guidebook.  Patient learns that high blood pressure, or hypertension, is very common in the [place] . Hypertension is largely due to excessive salt intake, but other important risk factors include being overweight, physical inactivity, drinking too much alcohol, smoking, and not eating enough potassium from fruits and vegetables. High blood pressure is a leading risk factor for heart attack, stroke, congestive heart failure, dementia, kidney failure, and premature death. Long-term effects of excessive salt intake include stiffening of the arteries and thickening of heart muscle and organ damage. Recommendations include ways to reduce hypertension and the risk of heart disease.  Diseases of Our Time - Focusing on Diabetes Clinical staff conducted group or individual video education with verbal and written material and guidebook.  Patient learns why the best way to stop diseases of our time is prevention, through food and other  lifestyle changes. Medicine (such as prescription pills and surgeries) is often only a Band-Aid on the problem, not a long-term solution. Most common diseases of our time include obesity, type 2 diabetes, hypertension, heart disease, and cancer. The Pritikin Program is recommended and has been proven to help reduce, reverse, and/or prevent the damaging effects of metabolic syndrome.  Nutrition   Overview of the Pritikin Eating Plan  Clinical staff conducted group or individual video education with verbal and written material and guidebook.  Patient learns about the Pritikin Eating Plan for disease risk reduction. The Pritikin Eating Plan emphasizes a wide variety of unrefined, minimally-processed carbohydrates, like fruits, vegetables, whole grains, and legumes. Go, Caution, and Stop food choices are explained. Plant-based and lean animal proteins are emphasized. Rationale provided for low sodium intake for blood pressure control,  low added sugars for blood sugar stabilization, and low added fats and oils for coronary artery disease risk reduction and weight management.  Calorie Density  Clinical staff conducted group or individual video education with verbal and written material and guidebook.  Patient learns about calorie density and how it impacts the Pritikin Eating Plan. Knowing the characteristics of the food you choose will help you decide whether those foods will lead to weight gain or weight loss, and whether you want to consume more or less of them. Weight loss is usually a side effect of the Pritikin Eating Plan because of its focus on low calorie-dense foods.  Label Reading  Clinical staff conducted group or individual video education with verbal and written material and guidebook.  Patient learns about the Pritikin recommended label reading guidelines and corresponding recommendations regarding calorie density, added sugars, sodium content, and whole grains.  Dining Out - Part 1  Clinical staff conducted group or individual video education with verbal and written material and guidebook.  Patient learns that restaurant meals can be sabotaging because they can be so high in calories, fat, sodium, and/or sugar. Patient learns recommended strategies on how to positively address this and avoid unhealthy pitfalls.  Facts on Fats  Clinical staff conducted group or individual video education with verbal and written material and guidebook.  Patient learns that lifestyle modifications can be just as effective, if not more so, as many medications for lowering your risk of heart disease. A Pritikin lifestyle can help to reduce your risk of inflammation and atherosclerosis (cholesterol build-up, or plaque, in the artery walls). Lifestyle interventions such as dietary choices and physical activity address the cause of atherosclerosis. A review of the types of fats and their impact on blood cholesterol levels, along with dietary  recommendations to reduce fat intake is also included.  Nutrition Action Plan  Clinical staff conducted group or individual video education with verbal and written material and guidebook.  Patient learns how to incorporate Pritikin recommendations into their lifestyle. Recommendations include planning and keeping personal health goals in mind as an important part of their success.  Healthy Mind-Set    Healthy Minds, Bodies, Hearts  Clinical staff conducted group or individual video education with verbal and written material and guidebook.  Patient learns how to identify when they are stressed. Video will discuss the impact of that stress, as well as the many benefits of stress management. Patient will also be introduced to stress management techniques. The way we think, act, and feel has an impact on our hearts.  How Our Thoughts Can Heal Our Hearts  Clinical staff conducted group or individual video education with verbal and written material  and guidebook.  Patient learns that negative thoughts can cause depression and anxiety. This can result in negative lifestyle behavior and serious health problems. Cognitive behavioral therapy is an effective method to help control our thoughts in order to change and improve our emotional outlook.  Additional Videos:  Exercise    Improving Performance  Clinical staff conducted group or individual video education with verbal and written material and guidebook.  Patient learns to use a non-linear approach by alternating intensity levels and lengths of time spent exercising to help burn more calories and lose more body fat. Cardiovascular exercise helps improve heart health, metabolism, hormonal balance, blood sugar control, and recovery from fatigue. Resistance training improves strength, endurance, balance, coordination, reaction time, metabolism, and muscle mass. Flexibility exercise improves circulation, posture, and balance. Seek guidance from your  physician and exercise physiologist before implementing an exercise routine and learn your capabilities and proper form for all exercise.  Introduction to Yoga  Clinical staff conducted group or individual video education with verbal and written material and guidebook.  Patient learns about yoga, a discipline of the coming together of mind, breath, and body. The benefits of yoga include improved flexibility, improved range of motion, better posture and core strength, increased lung function, weight loss, and positive self-image. Yoga's heart health benefits include lowered blood pressure, healthier heart rate, decreased cholesterol and triglyceride levels, improved immune function, and reduced stress. Seek guidance from your physician and exercise physiologist before implementing an exercise routine and learn your capabilities and proper form for all exercise.  Medical   Aging: Enhancing Your Quality of Life  Clinical staff conducted group or individual video education with verbal and written material and guidebook.  Patient learns key strategies and recommendations to stay in good physical health and enhance quality of life, such as prevention strategies, having an advocate, securing a Health Care Proxy and Power of Attorney, and keeping a list of medications and system for tracking them. It also discusses how to avoid risk for bone loss.  Biology of Weight Control  Clinical staff conducted group or individual video education with verbal and written material and guidebook.  Patient learns that weight gain occurs because we consume more calories than we burn (eating more, moving less). Even if your body weight is normal, you may have higher ratios of fat compared to muscle mass. Too much body fat puts you at increased risk for cardiovascular disease, heart attack, stroke, type 2 diabetes, and obesity-related cancers. In addition to exercise, following the Pritikin Eating Plan can help reduce your  risk.  Decoding Lab Results  Clinical staff conducted group or individual video education with verbal and written material and guidebook.  Patient learns that lab test reflects one measurement whose values change over time and are influenced by many factors, including medication, stress, sleep, exercise, food, hydration, pre-existing medical conditions, and more. It is recommended to use the knowledge from this video to become more involved with your lab results and evaluate your numbers to speak with your doctor.   Diseases of Our Time - Overview  Clinical staff conducted group or individual video education with verbal and written material and guidebook.  Patient learns that according to the CDC, 50% to 70% of chronic diseases (such as obesity, type 2 diabetes, elevated lipids, hypertension, and heart disease) are avoidable through lifestyle improvements including healthier food choices, listening to satiety cues, and increased physical activity.  Sleep Disorders Clinical staff conducted group or individual video education with verbal and written  material and guidebook.  Patient learns how good quality and duration of sleep are important to overall health and well-being. Patient also learns about sleep disorders and how they impact health along with recommendations to address them, including discussing with a physician.  Nutrition  Dining Out - Part 2 Clinical staff conducted group or individual video education with verbal and written material and guidebook.  Patient learns how to plan ahead and communicate in order to maximize their dining experience in a healthy and nutritious manner. Included are recommended food choices based on the type of restaurant the patient is visiting.   Fueling a Banker conducted group or individual video education with verbal and written material and guidebook.  There is a strong connection between our food choices and our health. Diseases  like obesity and type 2 diabetes are very prevalent and are in large-part due to lifestyle choices. The Pritikin Eating Plan provides plenty of food and hunger-curbing satisfaction. It is easy to follow, affordable, and helps reduce health risks.  Menu Workshop  Clinical staff conducted group or individual video education with verbal and written material and guidebook.  Patient learns that restaurant meals can sabotage health goals because they are often packed with calories, fat, sodium, and sugar. Recommendations include strategies to plan ahead and to communicate with the manager, chef, or server to help order a healthier meal.  Planning Your Eating Strategy  Clinical staff conducted group or individual video education with verbal and written material and guidebook.  Patient learns about the Pritikin Eating Plan and its benefit of reducing the risk of disease. The Pritikin Eating Plan does not focus on calories. Instead, it emphasizes high-quality, nutrient-rich foods. By knowing the characteristics of the foods, we choose, we can determine their calorie density and make informed decisions.  Targeting Your Nutrition Priorities  Clinical staff conducted group or individual video education with verbal and written material and guidebook.  Patient learns that lifestyle habits have a tremendous impact on disease risk and progression. This video provides eating and physical activity recommendations based on your personal health goals, such as reducing LDL cholesterol, losing weight, preventing or controlling type 2 diabetes, and reducing high blood pressure.  Vitamins and Minerals  Clinical staff conducted group or individual video education with verbal and written material and guidebook.  Patient learns different ways to obtain key vitamins and minerals, including through a recommended healthy diet. It is important to discuss all supplements you take with your doctor.   Healthy Mind-Set    Smoking  Cessation  Clinical staff conducted group or individual video education with verbal and written material and guidebook.  Patient learns that cigarette smoking and tobacco addiction pose a serious health risk which affects millions of people. Stopping smoking will significantly reduce the risk of heart disease, lung disease, and many forms of cancer. Recommended strategies for quitting are covered, including working with your doctor to develop a successful plan.  Culinary   Becoming a Set designer conducted group or individual video education with verbal and written material and guidebook.  Patient learns that cooking at home can be healthy, cost-effective, quick, and puts them in control. Keys to cooking healthy recipes will include looking at your recipe, assessing your equipment needs, planning ahead, making it simple, choosing cost-effective seasonal ingredients, and limiting the use of added fats, salts, and sugars.  Cooking - Breakfast and Snacks  Clinical staff conducted group or individual video education with verbal and written  material and guidebook.  Patient learns how important breakfast is to satiety and nutrition through the entire day. Recommendations include key foods to eat during breakfast to help stabilize blood sugar levels and to prevent overeating at meals later in the day. Planning ahead is also a key component.  Cooking - Educational psychologist conducted group or individual video education with verbal and written material and guidebook.  Patient learns eating strategies to improve overall health, including an approach to cook more at home. Recommendations include thinking of animal protein as a side on your plate rather than center stage and focusing instead on lower calorie dense options like vegetables, fruits, whole grains, and plant-based proteins, such as beans. Making sauces in large quantities to freeze for later and leaving the skin on your  vegetables are also recommended to maximize your experience.  Cooking - Healthy Salads and Dressing Clinical staff conducted group or individual video education with verbal and written material and guidebook.  Patient learns that vegetables, fruits, whole grains, and legumes are the foundations of the Pritikin Eating Plan. Recommendations include how to incorporate each of these in flavorful and healthy salads, and how to create homemade salad dressings. Proper handling of ingredients is also covered. Cooking - Soups and [employer] - Soups and Desserts Clinical staff conducted group or individual video education with verbal and written material and guidebook.  Patient learns that Pritikin soups and desserts make for easy, nutritious, and delicious snacks and meal components that are low in sodium, fat, sugar, and calorie density, while high in vitamins, minerals, and filling fiber. Recommendations include simple and healthy ideas for soups and desserts.   Overview     The Pritikin Solution Program Overview Clinical staff conducted group or individual video education with verbal and written material and guidebook.  Patient learns that the results of the Pritikin Program have been documented in more than 100 articles published in peer-reviewed journals, and the benefits include reducing risk factors for (and, in some cases, even reversing) high cholesterol, high blood pressure, type 2 diabetes, obesity, and more! An overview of the three key pillars of the Pritikin Program will be covered: eating well, doing regular exercise, and having a healthy mind-set.  WORKSHOPS  Exercise: Exercise Basics: Building Your Action Plan Clinical staff led group instruction and group discussion with PowerPoint presentation and patient guidebook. To enhance the learning environment the use of posters, models and videos may be added. At the conclusion of this workshop, patients will comprehend the  difference between physical activity and exercise, as well as the benefits of incorporating both, into their routine. Patients will understand the FITT (Frequency, Intensity, Time, and Type) principle and how to use it to build an exercise action plan. In addition, safety concerns and other considerations for exercise and cardiac rehab will be addressed by the presenter. The purpose of this lesson is to promote a comprehensive and effective weekly exercise routine in order to improve patients' overall level of fitness.   Managing Heart Disease: Your Path to a Healthier Heart Clinical staff led group instruction and group discussion with PowerPoint presentation and patient guidebook. To enhance the learning environment the use of posters, models and videos may be added.At the conclusion of this workshop, patients will understand the anatomy and physiology of the heart. Additionally, they will understand how Pritikin's three pillars impact the risk factors, the progression, and the management of heart disease.  The purpose of this lesson is to provide a  high-level overview of the heart, heart disease, and how the Pritikin lifestyle positively impacts risk factors.  Exercise Biomechanics Clinical staff led group instruction and group discussion with PowerPoint presentation and patient guidebook. To enhance the learning environment the use of posters, models and videos may be added. Patients will learn how the structural parts of their bodies function and how these functions impact their daily activities, movement, and exercise. Patients will learn how to promote a neutral spine, learn how to manage pain, and identify ways to improve their physical movement in order to promote healthy living. The purpose of this lesson is to expose patients to common physical limitations that impact physical activity. Participants will learn practical ways to adapt and manage aches and pains, and to minimize their  effect on regular exercise. Patients will learn how to maintain good posture while sitting, walking, and lifting.  Balance Training and Fall Prevention  Clinical staff led group instruction and group discussion with PowerPoint presentation and patient guidebook. To enhance the learning environment the use of posters, models and videos may be added. At the conclusion of this workshop, patients will understand the importance of their sensorimotor skills (vision, proprioception, and the vestibular system) in maintaining their ability to balance as they age. Patients will apply a variety of balancing exercises that are appropriate for their current level of function. Patients will understand the common causes for poor balance, possible solutions to these problems, and ways to modify their physical environment in order to minimize their fall risk. The purpose of this lesson is to teach patients about the importance of maintaining balance as they age and ways to minimize their risk of falling.  WORKSHOPS   Nutrition:  Fueling a Ship broker led group instruction and group discussion with PowerPoint presentation and patient guidebook. To enhance the learning environment the use of posters, models and videos may be added. Patients will review the foundational principles of the Pritikin Eating Plan and understand what constitutes a serving size in each of the food groups. Patients will also learn Pritikin-friendly foods that are better choices when away from home and review make-ahead meal and snack options. Calorie density will be reviewed and applied to three nutrition priorities: weight maintenance, weight loss, and weight gain. The purpose of this lesson is to reinforce (in a group setting) the key concepts around what patients are recommended to eat and how to apply these guidelines when away from home by planning and selecting Pritikin-friendly options. Patients will understand how calorie  density may be adjusted for different weight management goals.  Mindful Eating  Clinical staff led group instruction and group discussion with PowerPoint presentation and patient guidebook. To enhance the learning environment the use of posters, models and videos may be added. Patients will briefly review the concepts of the Pritikin Eating Plan and the importance of low-calorie dense foods. The concept of mindful eating will be introduced as well as the importance of paying attention to internal hunger signals. Triggers for non-hunger eating and techniques for dealing with triggers will be explored. The purpose of this lesson is to provide patients with the opportunity to review the basic principles of the Pritikin Eating Plan, discuss the value of eating mindfully and how to measure internal cues of hunger and fullness using the Hunger Scale. Patients will also discuss reasons for non-hunger eating and learn strategies to use for controlling emotional eating.  Targeting Your Nutrition Priorities Clinical staff led group instruction and group discussion with PowerPoint presentation  and patient guidebook. To enhance the learning environment the use of posters, models and videos may be added. Patients will learn how to determine their genetic susceptibility to disease by reviewing their family history. Patients will gain insight into the importance of diet as part of an overall healthy lifestyle in mitigating the impact of genetics and other environmental insults. The purpose of this lesson is to provide patients with the opportunity to assess their personal nutrition priorities by looking at their family history, their own health history and current risk factors. Patients will also be able to discuss ways of prioritizing and modifying the Pritikin Eating Plan for their highest risk areas  Menu  Clinical staff led group instruction and group discussion with PowerPoint presentation and patient guidebook. To  enhance the learning environment the use of posters, models and videos may be added. Using menus brought in from [employer], or printed from [employer], patients will apply the Pritikin dining out guidelines that were presented in the [employer] video. Patients will also be able to practice these guidelines in a variety of provided scenarios. The purpose of this lesson is to provide patients with the opportunity to practice hands-on learning of the Pritikin Dining Out guidelines with actual menus and practice scenarios.  Label Reading Clinical staff led group instruction and group discussion with PowerPoint presentation and patient guidebook. To enhance the learning environment the use of posters, models and videos may be added. Patients will review and discuss the Pritikin label reading guidelines presented in Pritikin's Label Reading Educational series video. Using fool labels brought in from local grocery stores and markets, patients will apply the label reading guidelines and determine if the packaged food meet the Pritikin guidelines. The purpose of this lesson is to provide patients with the opportunity to review, discuss, and practice hands-on learning of the Pritikin Label Reading guidelines with actual packaged food labels. Cooking School  Pritikin's [employer] are designed to teach patients ways to prepare quick, simple, and affordable recipes at home. The importance of nutrition's role in chronic disease risk reduction is reflected in its emphasis in the overall Pritikin program. By learning how to prepare essential core Pritikin Eating Plan recipes, patients will increase control over what they eat; be able to customize the flavor of foods without the use of added salt, sugar, or fat; and improve the quality of the food they consume. By learning a set of core recipes which are easily assembled, quickly prepared, and affordable, patients are more likely to  prepare more healthy foods at home. These workshops focus on convenient breakfasts, simple entres, side dishes, and desserts which can be prepared with minimal effort and are consistent with nutrition recommendations for cardiovascular risk reduction. Cooking [employer] are taught by a Armed forces logistics/support/administrative officer (RD) who has been trained by the [employer]. The chef or RD has a clear understanding of the importance of minimizing - if not completely eliminating - added fat, sugar, and sodium in recipes. Throughout the series of Cooking School Workshop sessions, patients will learn about healthy ingredients and efficient methods of cooking to build confidence in their capability to prepare    Cooking School weekly topics:  Adding Flavor- Sodium-Free  Fast and Healthy Breakfasts  Powerhouse Plant-Based Proteins  Satisfying Salads and Dressings  Simple Sides and Sauces  International Cuisine-Spotlight on the [employer] Zones  Delicious Desserts  Savory Soups  [employer] - Meals in a Snap  Tasty Appetizers and  Snacks  Comforting Weekend Breakfasts  One-Pot Wonders   Fast [employer] Your Pritikin Plate  WORKSHOPS   Healthy Mindset (Psychosocial):  Focused Goals, Sustainable Changes Clinical staff led group instruction and group discussion with PowerPoint presentation and patient guidebook. To enhance the learning environment the use of posters, models and videos may be added. Patients will be able to apply effective goal setting strategies to establish at least one personal goal, and then take consistent, meaningful action toward that goal. They will learn to identify common barriers to achieving personal goals and develop strategies to overcome them. Patients will also gain an understanding of how our mind-set can impact our ability to achieve goals and the importance of cultivating a positive and growth-oriented mind-set. The purpose  of this lesson is to provide patients with a deeper understanding of how to set and achieve personal goals, as well as the tools and strategies needed to overcome common obstacles which may arise along the way.  From Head to Heart: The Power of a Healthy Outlook  Clinical staff led group instruction and group discussion with PowerPoint presentation and patient guidebook. To enhance the learning environment the use of posters, models and videos may be added. Patients will be able to recognize and describe the impact of emotions and mood on physical health. They will discover the importance of self-care and explore self-care practices which may work for them. Patients will also learn how to utilize the 4 C's to cultivate a healthier outlook and better manage stress and challenges. The purpose of this lesson is to demonstrate to patients how a healthy outlook is an essential part of maintaining good health, especially as they continue their cardiac rehab journey.  Healthy Sleep for a Healthy Heart Clinical staff led group instruction and group discussion with PowerPoint presentation and patient guidebook. To enhance the learning environment the use of posters, models and videos may be added. At the conclusion of this workshop, patients will be able to demonstrate knowledge of the importance of sleep to overall health, well-being, and quality of life. They will understand the symptoms of, and treatments for, common sleep disorders. Patients will also be able to identify daytime and nighttime behaviors which impact sleep, and they will be able to apply these tools to help manage sleep-related challenges. The purpose of this lesson is to provide patients with a general overview of sleep and outline the importance of quality sleep. Patients will learn about a few of the most common sleep disorders. Patients will also be introduced to the concept of "sleep hygiene," and discover ways to self-manage certain sleeping  problems through simple daily behavior changes. Finally, the workshop will motivate patients by clarifying the links between quality sleep and their goals of heart-healthy living.   Recognizing and Reducing Stress Clinical staff led group instruction and group discussion with PowerPoint presentation and patient guidebook. To enhance the learning environment the use of posters, models and videos may be added. At the conclusion of this workshop, patients will be able to understand the types of stress reactions, differentiate between acute and chronic stress, and recognize the impact that chronic stress has on their health. They will also be able to apply different coping mechanisms, such as reframing negative self-talk. Patients will have the opportunity to practice a variety of stress management techniques, such as deep abdominal breathing, progressive muscle relaxation, and/or guided imagery.  The purpose of this lesson is to educate patients on the role of stress  in their lives and to provide healthy techniques for coping with it.  Learning Barriers/Preferences:  Learning Barriers/Preferences - 03/02/24 1545       Learning Barriers/Preferences   Learning Barriers Sight    Learning Preferences Individual Instruction;Group Instruction;Computer/Internet          Education Topics:  Knowledge Questionnaire Score:  Knowledge Questionnaire Score - 03/02/24 1410       Knowledge Questionnaire Score   Pre Score 23/24          Core Components/Risk Factors/Patient Goals at Admission:  Personal Goals and Risk Factors at Admission - 03/02/24 1546       Core Components/Risk Factors/Patient Goals on Admission    Weight Management Yes;Obesity;Weight Loss    Intervention Weight Management: Develop a combined nutrition and exercise program designed to reach desired caloric intake, while maintaining appropriate intake of nutrient and fiber, sodium and fats, and appropriate energy expenditure  required for the weight goal.;Weight Management: Provide education and appropriate resources to help participant work on and attain dietary goals.;Weight Management/Obesity: Establish reasonable short term and long term weight goals.;Obesity: Provide education and appropriate resources to help participant work on and attain dietary goals.    Admit Weight 268 lb 15.4 oz (122 kg)    Expected Outcomes Short Term: Continue to assess and modify interventions until short term weight is achieved;Long Term: Adherence to nutrition and physical activity/exercise program aimed toward attainment of established weight goal;Weight Loss: Understanding of general recommendations for a balanced deficit meal plan, which promotes 1-2 lb weight loss per week and includes a negative energy balance of [phone] kcal/d;Understanding recommendations for meals to include 15-35% energy as protein, 25-35% energy from fat, 35-60% energy from carbohydrates, less than 200mg  of dietary cholesterol, 20-35 gm of total fiber daily;Understanding of distribution of calorie intake throughout the day with the consumption of 4-5 meals/snacks    Lipids Yes    Intervention Provide education and support for participant on nutrition & aerobic/resistive exercise along with prescribed medications to achieve LDL 70mg , HDL >40mg .    Expected Outcomes Short Term: Participant states understanding of desired cholesterol values and is compliant with medications prescribed. Participant is following exercise prescription and nutrition guidelines.;Long Term: Cholesterol controlled with medications as prescribed, with individualized exercise RX and with personalized nutrition plan. Value goals: LDL < 70mg , HDL > 40 mg.          Core Components/Risk Factors/Patient Goals Review:    Core Components/Risk Factors/Patient Goals at Discharge (Final Review):    ITP Comments:  ITP Comments     Row Name 03/02/24 1327           ITP Comments Wilbert Bihari,  MD: Medical Director. Introduction to the Pritikin Education Program/Intensive Cardiac Rehab. Initial orientation packet reviewed with the patient.          Comments: Participant attended orientation for the cardiac rehabilitation program on  03/02/2024  to perform initial intake and exercise walk test. Patient introduced to the Pritikin Program education and orientation packet was reviewed. Completed 6-minute walk test, measurements, initial ITP, and exercise prescription. Vital signs stable. Telemetry-Ventricular paced rhythm, asymptomatic.   Service time was from 1315 to 1510.

## 2024-03-02 NOTE — Progress Notes (Signed)
 Cardiac Rehab Medication Review by a Nurse  Does the patient  feel that his/her medications are working for him/her?  yes  Has the patient been experiencing any side effects to the medications prescribed?  no  Does the patient measure his/her own blood pressure or blood glucose at home?  yes   Does the patient have any problems obtaining medications due to transportation or finances?   no  Understanding of regimen: excellent Understanding of indications: excellent Potential of compliance: excellent    Nurse comments: Charlene Tapia is taking her medications as prescribed and has a good understanding of what her medications are for.    Charlene Tapia Gracy Ehly RN 03/02/2024 1:33 PM

## 2024-03-05 ENCOUNTER — Ambulatory Visit (INDEPENDENT_AMBULATORY_CARE_PROVIDER_SITE_OTHER)

## 2024-03-05 VITALS — BP 108/62 | HR 65 | Ht 61.0 in | Wt 266.6 lb

## 2024-03-05 DIAGNOSIS — Z1231 Encounter for screening mammogram for malignant neoplasm of breast: Secondary | ICD-10-CM | POA: Diagnosis not present

## 2024-03-05 DIAGNOSIS — Z0000 Encounter for general adult medical examination without abnormal findings: Secondary | ICD-10-CM

## 2024-03-05 DIAGNOSIS — Z780 Asymptomatic menopausal state: Secondary | ICD-10-CM

## 2024-03-05 NOTE — Progress Notes (Signed)
 Subjective:   Charlene Tapia is a 73 y.o. who presents for a Medicare Wellness preventive visit.  As a reminder, Annual Wellness Visits don't include a physical exam, and some assessments may be limited, especially if this visit is performed virtually. We may recommend an in-person follow-up visit with your provider if needed.  Visit Complete: In person  Persons Participating in Visit: Patient.  AWV Questionnaire: No: Patient Medicare AWV questionnaire was not completed prior to this visit.  Cardiac Risk Factors include: advanced age (>54men, >42 women);hypertension;obesity (BMI >30kg/m2);Other (see comment);dyslipidemia, Risk factor comments: CHF     Objective:    Today's Vitals   03/05/24 0814  BP: 108/62  Pulse: 65  SpO2: 99%  Weight: 266 lb 9.6 oz (120.9 kg)  Height: 5' 1 (1.549 m)   Body mass index is 50.37 kg/m.     03/05/2024    8:19 AM 07/03/2023    4:33 PM 07/03/2023    9:45 AM 02/17/2023    3:39 PM 02/04/2023   11:51 AM 11/25/2022    9:14 AM 01/08/2022   10:42 AM  Advanced Directives  Does Patient Have a Medical Advance Directive? No No No No No No No  Would patient like information on creating a medical advance directive?  No - Patient declined  No - Patient declined No - Patient declined No - Patient declined No - Patient declined    Current Medications (verified) Outpatient Encounter Medications as of 03/05/2024  Medication Sig   aspirin  EC 81 MG tablet Take 1 tablet (81 mg total) by mouth daily. Swallow whole.   fluticasone  (FLONASE ) 50 MCG/ACT nasal spray Place 2 sprays into both nostrils daily as needed for allergies or rhinitis.   Ipratropium-Albuterol  (COMBIVENT) 20-100 MCG/ACT AERS respimat Inhale 2 puffs into the lungs every 6 (six) hours as needed for wheezing or shortness of breath.   losartan  (COZAAR ) 25 MG tablet Take 1 tablet (25 mg total) by mouth daily.   metoprolol  succinate (TOPROL  XL) 25 MG 24 hr tablet Take 1 tablet (25 mg total) by  mouth at bedtime.   Multiple Vitamins-Minerals (MULTIVITAMIN GUMMIES WOMENS PO) Take 2 each by mouth daily after supper.   rosuvastatin  (CRESTOR ) 5 MG tablet Take 1 tablet (5 mg total) by mouth every other day.   spironolactone  (ALDACTONE ) 25 MG tablet Take 1 tablet (25 mg total) by mouth daily.   torsemide  (DEMADEX ) 20 MG tablet Take 1 tablet (20 mg total) by mouth daily.   No facility-administered encounter medications on file as of 03/05/2024.    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 Wendel and Dr. Maryjane   Severe aortic stenosis    Past Surgical History:  Procedure Laterality Date   CARDIAC CATHETERIZATION     GANGLION CYST EXCISION Right 01/18/1989   INGUINAL HERNIA REPAIR Bilateral 1952   age 82 month   INTRAOPERATIVE TRANSTHORACIC ECHOCARDIOGRAM N/A 02/04/2023   Procedure: INTRAOPERATIVE TRANSTHORACIC ECHOCARDIOGRAM;  Surgeon: Wendel Lurena POUR, MD;  Location: [facility];  Service: Open Heart Surgery;  Laterality: N/A;   JOINT REPLACEMENT     PACEMAKER IMPLANT N/A 02/18/2023   Procedure: PACEMAKER IMPLANT;  Surgeon: Kennyth Chew, MD;  Location: [facility];  Service: Cardiovascular;  Laterality: N/A;   RIGHT HEART CATH AND CORONARY ANGIOGRAPHY N/A 11/25/2022   Procedure: RIGHT HEART CATH AND CORONARY ANGIOGRAPHY;  Surgeon: Wendel Lurena POUR, MD;  Location: [facility];  Service: Cardiovascular;  Laterality: N/A;   TEMPORARY PACEMAKER N/A 02/17/2023   Procedure: TEMPORARY PACEMAKER;  Surgeon: Mady Bruckner, MD;  Location: [facility];  Service: Cardiovascular;  Laterality: N/A;   TOTAL KNEE ARTHROPLASTY Left 08/16/2013   Procedure: TOTAL  KNEE ARTHROPLASTY;  Surgeon: Dempsey JINNY Sensor, MD;  Location: [facility];  Service: Orthopedics;  Laterality: Left;   TOTAL KNEE ARTHROPLASTY Right 09/29/2013   TOTAL KNEE ARTHROPLASTY Right 09/29/2013   Procedure: TOTAL KNEE ARTHROPLASTY;  Surgeon: Dempsey JINNY Sensor, MD;  Location: [facility];  Service: Orthopedics;  Laterality: Right;   TRANSCATHETER AORTIC VALVE REPLACEMENT, TRANSFEMORAL N/A 02/04/2023   Procedure: Transcatheter Aortic Valve Replacement, Transfemoral;  Surgeon: Thukkani, Arun K, MD;  Location: [facility];  Service: Open Heart Surgery;  Laterality: N/A;   Family History  Problem Relation Age of Onset   Heart disease Father    Hypertension Father    Alcohol abuse Father    Social History   Socioeconomic History   Marital status: Married    Spouse name: Charlene Tapia   Number of children: 0   Years of education: 16   Highest education level: Bachelor's degree (e.g., BA, AB, BS)  Occupational History   Occupation: Retired  Tobacco Use   Smoking status: Never   Smokeless tobacco: Never  Vaping Use   Vaping status: Never Used  Substance and Sexual Activity   Alcohol use: Yes    Comment: Social drinking wine only   Drug use: No   Sexual activity: Not Currently    Comment: 17 years of age/spouse w/ ED  Other Topics Concern   Not on file  Social History Narrative   Lives with husband/2025   Social Drivers of Health   Financial Resource Strain: Low Risk  (03/05/2024)   Overall Financial Resource Strain (CARDIA)    Difficulty of Paying Living Expenses: Not hard at all  Food Insecurity: No Food Insecurity (03/05/2024)   Hunger Vital Sign    Worried About Running Out of Food in the Last Year: Never true    Ran Out of Food in the Last Year: Never true  Transportation Needs: No Transportation Needs (03/05/2024)   PRAPARE - Administrator, Civil Service (Medical): No    Lack of Transportation (Non-Medical): No  Physical Activity: Inactive (03/05/2024)   Exercise  Vital Sign    Days of Exercise per Week: 0 days    Minutes of Exercise per Session: 0 min  Stress: No Stress Concern Present (03/05/2024)   Charlene Tapia [employer]ational [employer] - Occupational Stress Questionnaire    Feeling of Stress: Not at all  Social Connections: Socially Integrated (03/05/2024)   Social Connection and Isolation Panel    Frequency of Communication with Friends and Family: More than three times a week    Frequency of Social Gatherings with Friends and Family: Three times a week    Attends Religious Services: More than 4 times per year    Active Member of Clubs or Organizations: Yes    Attends Banker Meetings: More than 4 times per year    Marital Status: Married    Tobacco Counseling Counseling given: Not Answered    Clinical Intake:  Pre-visit preparation completed: Yes  Diabetes: No  Lab Results  Component Value Date   HGBA1C 5.8 10/24/2022   HGBA1C 6.0 11/12/2021     How often do you need to have someone help you when you read instructions, pamphlets, or other written materials from your doctor or pharmacy?: 1 - Never  Interpreter Needed?: No  Information entered by :: Arvella Massingale, RMA   Activities of Daily Living     03/05/2024    8:11 AM 07/06/2023    4:11 PM  In your present state of health, do you have any difficulty performing the following activities:  Hearing? 0   Vision? 0   Difficulty concentrating or making decisions? 0   Walking or climbing stairs? 0   Dressing or bathing? 0   Doing errands, shopping? 0 0  Preparing Food and eating ? N   Using the Toilet? N   In the past six months, have you accidently leaked urine? N   Do you have problems with loss of bowel control? N   Managing your Medications? N   Managing your Finances? N   Housekeeping or managing your Housekeeping? N     Patient Care Team: Charlene Charlene BRAVO, NP as PCP - General (Nurse Practitioner) Thukkani, Arun K, MD as PCP - Cardiology  (Cardiology) Kennyth Chew, MD as PCP - Electrophysiology (Cardiology) Thukkani, Arun K, MD as PCP - Structural Heart (Cardiology)  I have updated your Care Teams any recent Medical Services you may have received from other providers in the past year.     Assessment:   This is a routine wellness examination for Charlene Tapia.  Hearing/Vision screen Hearing Screening - Comments:: Denies hearing difficulties   Vision Screening - Comments:: Denies vision issues./ No eye doctor    Goals Addressed   None    Depression Screen     03/05/2024    8:22 AM 03/02/2024    3:48 PM 02/25/2023    4:14 PM 11/01/2022    8:37 AM 10/18/2022   11:03 AM 11/12/2021    8:41 AM 09/25/2021    8:08 AM  PHQ 2/9 Scores  PHQ - 2 Score 0 0 0 0 0 0 0  PHQ- 9 Score 2 3 0   0     Fall Risk     03/05/2024    8:19 AM 03/02/2024    3:48 PM 02/25/2023    4:18 PM 11/01/2022    8:37 AM 10/18/2022   11:03 AM  Fall Risk   Falls in the past year? 1 1 0 0 0  Number falls in past yr: 1 0 0 0 0  Injury with Fall? 1 1 0 0 0  Risk for fall due to : Impaired balance/gait History of fall(s);Impaired balance/gait;Impaired mobility;Impaired vision;Other (Comment) No Fall Risks No Fall Risks No Fall Risks  Risk for fall due to: Comment  Pt may use walker or cane     Follow up Falls evaluation completed;Falls prevention discussed Falls evaluation completed Falls prevention discussed Falls evaluation completed Falls evaluation completed    MEDICARE RISK AT HOME:  Medicare Risk at Home Any stairs in or around the home?: No If so, are there any without handrails?: No Home free of loose throw rugs in walkways, pet beds, electrical cords, etc?: Yes Adequate lighting in your home to reduce risk of falls?: Yes Life alert?: No Use of a cane, walker or w/c?: Yes (walker and cane) Grab bars in the bathroom?: Yes Shower chair or bench in shower?: No Elevated toilet seat  or a handicapped toilet?: No  TIMED UP AND GO:  Was the test  performed?  Yes  Length of time to ambulate 10 feet: 20 sec Gait slow and steady without use of assistive device  Cognitive Function: Declined/Normal: No cognitive concerns noted by patient or family. Patient alert, oriented, able to answer questions appropriately and recall recent events. No signs of memory loss or confusion.        02/25/2023    3:29 PM 07/23/2018   11:48 AM  6CIT Screen  What Year? 0 points 0 points  What month? 0 points 0 points  What time? 0 points 0 points  Count back from 20 0 points 0 points  Months in reverse 0 points 0 points  Repeat phrase 0 points 0 points  Total Score 0 points 0 points    Immunizations Immunization History  Administered Date(s) Administered   Fluad Trivalent(High Dose 65+) 02/25/2023   Influenza-Unspecified 01/18/2013   Pneumococcal Conjugate-13 07/23/2018   Pneumococcal Polysaccharide-23 08/01/2021   Td 07/23/2018   Zoster Recombinant(Shingrix ) 08/01/2021, 11/12/2021    Screening Tests Health Maintenance  Topic Date Due   Colonoscopy  Never done   Mammogram  08/17/2009   DEXA SCAN  Never done   Influenza Vaccine  12/19/2023   COVID-19 Vaccine (1 - 2025-26 season) Never done   Medicare Annual Wellness (AWV)  02/25/2024   DTaP/Tdap/Td (2 - Tdap) 07/22/2028   Pneumococcal Vaccine: 50+ Years  Completed   Hepatitis C Screening  Completed   Zoster Vaccines- Shingrix   Completed   Meningococcal B Vaccine  Aged Out    Health Maintenance Items Addressed: Mammogram ordered, DEXA ordered, See Nurse Notes at the end of this note  Additional Screening:  Vision Screening: Recommended annual ophthalmology exams for early detection of glaucoma and other disorders of the eye. Is the patient up to date with their annual eye exam?  No  Who is the provider or what is the name of the office in which the patient attends annual eye exams? Has no doctor-per pt  Dental Screening: Recommended annual dental exams for proper oral  hygiene  Community Resource Referral / Chronic Care Management: CRR required this visit?  No   CCM required this visit?  No   Plan:    I have personally reviewed and noted the following in the patient's chart:   Medical and social history Use of alcohol, tobacco or illicit drugs  Current medications and supplements including opioid prescriptions. Patient is not currently taking opioid prescriptions. Functional ability and status Nutritional status Physical activity Advanced directives List of other physicians Hospitalizations, surgeries, and ER visits in previous 12 months Vitals Screenings to include cognitive, depression, and falls Referrals and appointments  In addition, I have reviewed and discussed with patient certain preventive protocols, quality metrics, and best practice recommendations. A written personalized care plan for preventive services as well as general preventive health recommendations were provided to patient.   Lore Polka L Leisha Tapia, CMA   03/05/2024   After Visit Summary: (MyChart) Due to this being a telephonic visit, the after visit summary with patients personalized plan was offered to patient via MyChart   Notes: Patient is due for a mammogram and a DEXA.  Both havae been ordered today.  She is due for a colonoscopy and would like to discuss during her next office visit with provider.  Patient's last Cologuard was in 2020.  Patient has been scheduled for a CPE with Charlene for this year.

## 2024-03-05 NOTE — Patient Instructions (Signed)
 Charlene Tapia,  Thank you for taking the time for your Medicare Wellness Visit. I appreciate your continued commitment to your health goals. Please review the care plan we discussed, and feel free to reach out if I can assist you further.  Medicare recommends these wellness visits once per year to help you and your care team stay ahead of potential health issues. These visits are designed to focus on prevention, allowing your provider to concentrate on managing your acute and chronic conditions during your regular appointments.  Please note that Annual Wellness Visits do not include a physical exam. Some assessments may be limited, especially if the visit was conducted virtually. If needed, we may recommend a separate in-person follow-up with your provider.  Ongoing Care Seeing your primary care provider every 3 to 6 months helps us  monitor your health and provide consistent, personalized care. Next office visit on 04/30/2024.    Referrals If a referral was made during today's visit and you haven't received any updates within two weeks, please contact the referred provider directly to check on the status. You have an order for:  [x]   3D Mammogram  [x]   Bone Density     Please call for appointment:  [facility] [address] [place], [place] [address] [phone]  [facility] [address] [place], [place] [address] [phone]  Make sure to wear two-piece clothing.  No lotions, powders, or deodorants the day of the appointment. Make sure to bring picture ID and insurance card.  Bring list of medications you are currently taking including any supplements.    Recommended Screenings:  Health Maintenance  Topic Date Due   Colon Cancer Screening  Never done   Breast Cancer Screening  08/17/2009   DEXA scan (bone density measurement)  Never done   Flu Shot  12/19/2023   COVID-19 Vaccine (1 - 2025-26 season) Never done   Medicare  Annual Wellness Visit  02/25/2024   DTaP/Tdap/Td vaccine (2 - Tdap) 07/22/2028   Pneumococcal Vaccine for age over 40  Completed   Hepatitis C Screening  Completed   Zoster (Shingles) Vaccine  Completed   Meningitis B Vaccine  Aged Out       03/05/2024    8:19 AM  Advanced Directives  Does Patient Have a Medical Advance Directive? No   Advance Care Planning is important because it: Ensures you receive medical care that aligns with your values, goals, and preferences. Provides guidance to your family and loved ones, reducing the emotional burden of decision-making during critical moments.  Vision: Annual vision screenings are recommended for early detection of glaucoma, cataracts, and diabetic retinopathy. These exams can also reveal signs of chronic conditions such as diabetes and high blood pressure.  Dental: Annual dental screenings help detect early signs of oral cancer, gum disease, and other conditions linked to overall health, including heart disease and diabetes.  Please see the attached documents for additional preventive care recommendations.

## 2024-03-08 ENCOUNTER — Other Ambulatory Visit (HOSPITAL_COMMUNITY): Payer: Self-pay

## 2024-03-08 ENCOUNTER — Encounter (HOSPITAL_COMMUNITY)
Admission: RE | Admit: 2024-03-08 | Discharge: 2024-03-08 | Disposition: A | Discharge status: Home / Self Care | Source: Ambulatory Visit | Attending: Internal Medicine | Admitting: Internal Medicine

## 2024-03-08 DIAGNOSIS — Z952 Presence of prosthetic heart valve: Secondary | ICD-10-CM | POA: Diagnosis not present

## 2024-03-08 NOTE — Progress Notes (Signed)
 Daily Session Note  Patient Details  Name: ELBIA PARO MRN: [id number] Date of Birth: January 17, 1951 Referring Provider:   Flowsheet Row INTENSIVE CARDIAC REHAB ORIENT from 03/02/2024 in [facility]  Referring Provider Vina Gull, MD    Encounter Date: 03/08/2024  Check In:   Capillary Blood Glucose: No results found for this or any previous visit (from the past 24 hours).   Exercise Prescription Changes - 03/08/24 1453       Response to Exercise   Blood Pressure (Admit) 126/58    Blood Pressure (Exercise) 132/60    Blood Pressure (Exit) 106/78    Heart Rate (Admit) 71 bpm    Heart Rate (Exercise) 92 bpm    Heart Rate (Exit) 80 bpm    Rating of Perceived Exertion (Exercise) 13    Symptoms None    Comments Off to a good start with exercise.    Duration Progress to 30 minutes of  aerobic without signs/symptoms of physical distress    Intensity THRR unchanged      Progression   Progression Continue to progress workloads to maintain intensity without signs/symptoms of physical distress.    Average METs 1.7      Resistance Training   Training Prescription Yes    Weight 2 lbs    Reps 10-15    Time 5 Minutes      Interval Training   Interval Training No      NuStep   Level 1    SPM 88    Minutes 26    METs 1.7          Social History   Tobacco Use  Smoking Status Never  Smokeless Tobacco Never    Goals Met:  Exercise tolerated well No report of concerns or symptoms today Strength training completed today  Goals Unmet:  Not Applicable  Comments: Pt started cardiac rehab today.  Pt tolerated light exercise without difficulty. VSS, telemetry-paced, asymptomatic.Marval is deconditioned.   Medication list reconciled. Pt denies barriers to medicaiton compliance.  PSYCHOSOCIAL ASSESSMENT:  PHQ-3. Pt exhibits positive coping skills, hopeful outlook with supportive family. No psychosocial needs  identified at this time, no psychosocial interventions necessary.    Pt enjoys working in her flower bed and garden.   Pt oriented to exercise equipment and routine.    Understanding verbalized. Denna Elpidio Quan RN BSN    Dr. Wilbert Bihari is Medical Director for Cardiac Rehab at [facility].

## 2024-03-09 ENCOUNTER — Other Ambulatory Visit: Payer: Self-pay | Admitting: Physician Assistant

## 2024-03-09 ENCOUNTER — Other Ambulatory Visit (HOSPITAL_COMMUNITY): Payer: Self-pay

## 2024-03-10 ENCOUNTER — Encounter (HOSPITAL_COMMUNITY): Admission: RE | Admit: 2024-03-10 | Discharge: 2024-03-10 | Attending: Internal Medicine

## 2024-03-10 DIAGNOSIS — Z952 Presence of prosthetic heart valve: Secondary | ICD-10-CM

## 2024-03-11 ENCOUNTER — Other Ambulatory Visit (HOSPITAL_BASED_OUTPATIENT_CLINIC_OR_DEPARTMENT_OTHER): Payer: Self-pay

## 2024-03-11 ENCOUNTER — Telehealth: Payer: Self-pay | Admitting: Physician Assistant

## 2024-03-11 MED ORDER — METOPROLOL SUCCINATE ER 25 MG PO TB24: 25.0000 mg | ORAL_TABLET | Freq: Every day | ORAL | 3 refills | Status: AC

## 2024-03-11 NOTE — Telephone Encounter (Signed)
 RX sent in

## 2024-03-11 NOTE — Telephone Encounter (Signed)
 *  STAT* If patient is at the pharmacy, call can be transferred to refill team.   1. Which medications need to be refilled? (please list name of each medication and dose if known)   metoprolol  succinate (TOPROL  XL) 25 MG 24 hr tablet   2. Which pharmacy/location (including street and city if local pharmacy) is medication to be sent to?  Pharmacy update:  [id number] [place], [place] - [address]     3. Do they need a 30 day or 90 day supply? 90 days   Patient is completely out of meds

## 2024-03-12 ENCOUNTER — Encounter (HOSPITAL_COMMUNITY)
Admission: RE | Admit: 2024-03-12 | Discharge: 2024-03-12 | Disposition: A | Discharge status: Home / Self Care | Source: Ambulatory Visit | Attending: Internal Medicine | Admitting: Internal Medicine

## 2024-03-12 DIAGNOSIS — Z952 Presence of prosthetic heart valve: Secondary | ICD-10-CM

## 2024-03-15 ENCOUNTER — Encounter (HOSPITAL_COMMUNITY)
Admission: RE | Admit: 2024-03-15 | Discharge: 2024-03-15 | Disposition: A | Source: Ambulatory Visit | Attending: Internal Medicine

## 2024-03-15 DIAGNOSIS — Z952 Presence of prosthetic heart valve: Secondary | ICD-10-CM

## 2024-03-17 ENCOUNTER — Encounter (HOSPITAL_COMMUNITY)
Admission: RE | Admit: 2024-03-17 | Discharge: 2024-03-17 | Disposition: A | Source: Ambulatory Visit | Attending: Internal Medicine

## 2024-03-17 DIAGNOSIS — Z952 Presence of prosthetic heart valve: Secondary | ICD-10-CM

## 2024-03-19 ENCOUNTER — Encounter (HOSPITAL_COMMUNITY)
Admission: RE | Admit: 2024-03-19 | Discharge: 2024-03-19 | Disposition: A | Discharge status: Home / Self Care | Source: Ambulatory Visit | Attending: Internal Medicine | Admitting: Internal Medicine

## 2024-03-19 DIAGNOSIS — Z952 Presence of prosthetic heart valve: Secondary | ICD-10-CM

## 2024-03-22 ENCOUNTER — Encounter (HOSPITAL_COMMUNITY)
Admission: RE | Admit: 2024-03-22 | Discharge: 2024-03-22 | Disposition: A | Discharge status: Home / Self Care | Source: Ambulatory Visit | Attending: Internal Medicine | Admitting: Internal Medicine

## 2024-03-22 DIAGNOSIS — Z952 Presence of prosthetic heart valve: Secondary | ICD-10-CM | POA: Diagnosis present

## 2024-03-23 NOTE — Progress Notes (Signed)
 Cardiac Individual Treatment Plan  Patient Details  Name: Charlene Tapia MRN: [id number] Date of Birth: October 25, 1950 Referring Provider:   Flowsheet Row INTENSIVE CARDIAC REHAB ORIENT from 03/02/2024 in [facility]  Referring Provider Vina Gull, MD    Initial Encounter Date:  Flowsheet Row INTENSIVE CARDIAC REHAB ORIENT from 03/02/2024 in [facility]  Date 03/02/24    Visit Diagnosis: 02/04/23 S/P TAVR (transcatheter aortic valve replacement)  Patient's Home Medications on Admission:  Current Outpatient Medications:    aspirin  EC 81 MG tablet, Take 1 tablet (81 mg total) by mouth daily. Swallow whole., Disp: 120 tablet, Rfl: 3   fluticasone  (FLONASE ) 50 MCG/ACT nasal spray, Place 2 sprays into both nostrils daily as needed for allergies or rhinitis., Disp: , Rfl:    Ipratropium-Albuterol  (COMBIVENT) 20-100 MCG/ACT AERS respimat, Inhale 2 puffs into the lungs every 6 (six) hours as needed for wheezing or shortness of breath., Disp: 4 g, Rfl: 1   losartan  (COZAAR ) 25 MG tablet, Take 1 tablet (25 mg total) by mouth daily., Disp: 90 tablet, Rfl: 3   metoprolol  succinate (TOPROL  XL) 25 MG 24 hr tablet, Take 1 tablet (25 mg total) by mouth at bedtime., Disp: 90 tablet, Rfl: 3   Multiple Vitamins-Minerals (MULTIVITAMIN GUMMIES WOMENS PO), Take 2 each by mouth daily after supper., Disp: , Rfl:    rosuvastatin  (CRESTOR ) 5 MG tablet, Take 1 tablet (5 mg total) by mouth every other day., Disp: 90 tablet, Rfl: 3   spironolactone  (ALDACTONE ) 25 MG tablet, Take 1 tablet (25 mg total) by mouth daily., Disp: 90 tablet, Rfl: 3   torsemide  (DEMADEX ) 20 MG tablet, Take 1 tablet (20 mg total) by mouth daily., Disp: 90 tablet, Rfl: 3  Past Medical History: Past Medical History:  Diagnosis Date   Allergy    High cholesterol    HTN (hypertension) 10/18/2022   Osteoarthritis    knees;  thumbs (10/01/2013)   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 Wendel and Dr. Maryjane   Severe aortic stenosis     Tobacco Use: Social History   Tobacco Use  Smoking Status Never  Smokeless Tobacco Never    Labs: Review Flowsheet  More data exists      Latest Ref Rng & Units 11/25/2022 02/04/2023 07/03/2023 07/21/2023 02/02/2024  Labs for ITP Cardiac and Pulmonary Rehab  Cholestrol 100 - 199 mg/dL - - - 792  848   LDL (calc) 0 - 99 mg/dL - - - 857  85   HDL-C >60 mg/dL - - - 43  48   Trlycerides 0 - 149 mg/dL - - - 877  94   PH, Arterial 7.35 - 7.45 7.366  - - - -  PCO2 arterial 32 - 48 mmHg 45.2  - - - -  Bicarbonate 20.0 - 28.0 mmol/L 19.0  26.1  25.9  - - - -  TCO2 22 - 32 mmol/L 20  28  27  23  24   - -  Acid-base deficit 0.0 - 2.0 mmol/L 8.0  - - - -  O2 Saturation % 73  77  92  - - - -    Details       Multiple values from one day are sorted in reverse-chronological order         Capillary  Blood Glucose: Lab Results  Component Value Date   GLUCAP 131 (H) 08/17/2013     Exercise Target Goals: Exercise Program Goal: Individual exercise prescription set using results from initial 6 min walk test and THRR while considering  patient's activity barriers and safety.   Exercise Prescription Goal: Initial exercise prescription builds to 30-45 minutes a day of aerobic activity, 2-3 days per week.  Home exercise guidelines will be given to patient during program as part of exercise prescription that the participant will acknowledge.  Activity Barriers & Risk Stratification:  Activity Barriers & Cardiac Risk Stratification - 03/02/24 1411       Activity Barriers & Cardiac Risk Stratification   Activity Barriers Shortness of Breath;Muscular Weakness;Deconditioning;Left Knee Replacement;Right Knee Replacement;Arthritis;Balance Concerns;History of Falls;Assistive Device    Cardiac  Risk Stratification High          6 Minute Walk:  6 Minute Walk     Row Name 03/02/24 1540         6 Minute Walk   Phase Initial     Distance 955 feet     Walk Time 6 minutes     # of Rest Breaks 0     MPH 1.8     METS 1.2     RPE 11     Perceived Dyspnea  1     VO2 Peak 3.6     Symptoms Yes (comment)     Comments Mild SOB, RPD =1 resolved with rest     Resting HR 74 bpm     Resting BP 106/64     Resting Oxygen Saturation  95 %     Exercise Oxygen Saturation  during 6 min walk 95 %     Max Ex. HR 85 bpm     Max Ex. BP 152/76     2 Minute Post BP 130/64        Oxygen Initial Assessment:   Oxygen Re-Evaluation:   Oxygen Discharge (Final Oxygen Re-Evaluation):   Initial Exercise Prescription:  Initial Exercise Prescription - 03/02/24 1500       Date of Initial Exercise RX and Referring Provider   Date 03/02/24    Referring Provider Vina Gull, MD    Expected Discharge Date 05/26/24      NuStep   Level 1    SPM 75    Minutes 25    METs 1.2      Prescription Details   Frequency (times per week) 3    Duration Progress to 30 minutes of continuous aerobic without signs/symptoms of physical distress      Intensity   THRR 40-80% of Max Heartrate 60-119    Ratings of Perceived Exertion 11-13    Perceived Dyspnea 0-4      Progression   Progression Continue progressive overload as per policy without signs/symptoms or physical distress.      Resistance Training   Training Prescription Yes    Weight 2 lbs    Reps 10-15          Perform Capillary Blood Glucose checks as needed.  Exercise Prescription Changes:   Exercise Prescription Changes     Row Name 03/08/24 1453             Response to Exercise   Blood Pressure (Admit) 126/58       Blood Pressure (Exercise) 132/60       Blood Pressure (Exit) 106/78       Heart Rate (Admit) 71 bpm  Heart Rate (Exercise) 92 bpm       Heart Rate (Exit) 80 bpm       Rating of Perceived Exertion  (Exercise) 13       Symptoms None       Comments Off to a good start with exercise.       Duration Progress to 30 minutes of  aerobic without signs/symptoms of physical distress       Intensity THRR unchanged         Progression   Progression Continue to progress workloads to maintain intensity without signs/symptoms of physical distress.       Average METs 1.7         Resistance Training   Training Prescription Yes       Weight 2 lbs       Reps 10-15       Time 5 Minutes         Interval Training   Interval Training No         NuStep   Level 1       SPM 88       Minutes 26       METs 1.7          Exercise Comments:   Exercise Comments     Row Name 03/08/24 1557           Exercise Comments Demetrice tolerated low intensity exercise fair without symptoms. Intially the recumbent stepper was uncomfortable, chaffing the groing, but after adjusting the seat back, she found it much more comfortable, and she was able to complete 26 minutes. Oriented to the stretching routine.          Exercise Goals and Review:   Exercise Goals     Row Name 03/02/24 1327             Exercise Goals   Increase Physical Activity Yes       Intervention Provide advice, education, support and counseling about physical activity/exercise needs.;Develop an individualized exercise prescription for aerobic and resistive training based on initial evaluation findings, risk stratification, comorbidities and participant's personal goals.       Expected Outcomes Short Term: Attend rehab on a regular basis to increase amount of physical activity.;Long Term: Add in home exercise to make exercise part of routine and to increase amount of physical activity.;Long Term: Exercising regularly at least 3-5 days a week.       Increase Strength and Stamina Yes       Intervention Provide advice, education, support and counseling about physical activity/exercise needs.;Develop an individualized exercise prescription  for aerobic and resistive training based on initial evaluation findings, risk stratification, comorbidities and participant's personal goals.       Expected Outcomes Short Term: Increase workloads from initial exercise prescription for resistance, speed, and METs.;Short Term: Perform resistance training exercises routinely during rehab and add in resistance training at home;Long Term: Improve cardiorespiratory fitness, muscular endurance and strength as measured by increased METs and functional capacity ( )       Able to understand and use rate of perceived exertion (RPE) scale Yes       Intervention Provide education and explanation on how to use RPE scale       Expected Outcomes Short Term: Able to use RPE daily in rehab to express subjective intensity level;Long Term:  Able to use RPE to guide intensity level when exercising independently       Knowledge and understanding of Target Heart Rate  Range (THRR) Yes       Intervention Provide education and explanation of THRR including how the numbers were predicted and where they are located for reference       Expected Outcomes Short Term: Able to state/look up THRR;Short Term: Able to use daily as guideline for intensity in rehab;Long Term: Able to use THRR to govern intensity when exercising independently       Understanding of Exercise Prescription Yes       Intervention Provide education, explanation, and written materials on patient's individual exercise prescription       Expected Outcomes Short Term: Able to explain program exercise prescription;Long Term: Able to explain home exercise prescription to exercise independently          Exercise Goals Re-Evaluation :  Exercise Goals Re-Evaluation     Row Name 03/08/24 1557             Exercise Goal Re-Evaluation   Exercise Goals Review Increase Physical Activity;Increase Strength and Stamina;Able to understand and use rate of perceived exertion (RPE) scale       Comments Marval is able  to understand and use RPE scale appropriately.       Expected Outcomes Progress workloads as tolerated to help increase strength, stamina, and flexibility.          Discharge Exercise Prescription (Final Exercise Prescription Changes):  Exercise Prescription Changes - 03/08/24 1453       Response to Exercise   Blood Pressure (Admit) 126/58    Blood Pressure (Exercise) 132/60    Blood Pressure (Exit) 106/78    Heart Rate (Admit) 71 bpm    Heart Rate (Exercise) 92 bpm    Heart Rate (Exit) 80 bpm    Rating of Perceived Exertion (Exercise) 13    Symptoms None    Comments Off to a good start with exercise.    Duration Progress to 30 minutes of  aerobic without signs/symptoms of physical distress    Intensity THRR unchanged      Progression   Progression Continue to progress workloads to maintain intensity without signs/symptoms of physical distress.    Average METs 1.7      Resistance Training   Training Prescription Yes    Weight 2 lbs    Reps 10-15    Time 5 Minutes      Interval Training   Interval Training No      NuStep   Level 1    SPM 88    Minutes 26    METs 1.7          Nutrition:  Target Goals: Understanding of nutrition guidelines, daily intake of sodium 1500mg , cholesterol 200mg , calories 30% from fat and 7% or less from saturated fats, daily to have 5 or more servings of fruits and vegetables.  Biometrics:  Pre Biometrics - 03/02/24 1400       Pre Biometrics   Waist Circumference 56.5 inches    Hip Circumference 54.5 inches    Waist to Hip Ratio 1.04 %    Triceps Skinfold 42 mm    % Body Fat 62 %    Grip Strength 19 kg    Flexibility 9 in    Single Leg Stand --   No perfromed, pt refused: afraid of falling          Nutrition Therapy Plan and Nutrition Goals:   Nutrition Assessments:  MEDIFICTS Score Key: >=70 Need to make dietary changes  40-70 Heart Healthy Diet <= 40 Therapeutic  Level Cholesterol Diet   Flowsheet Row INTENSIVE  CARDIAC REHAB from 03/08/2024 in [facility]  Picture Your Plate Total Score on Admission 61   Picture Your Plate Scores: <59 Unhealthy dietary pattern with much room for improvement. 41-50 Dietary pattern unlikely to meet recommendations for good health and room for improvement. 51-60 More healthful dietary pattern, with some room for improvement.  >60 Healthy dietary pattern, although there may be some specific behaviors that could be improved.    Nutrition Goals Re-Evaluation:   Nutrition Goals Re-Evaluation:   Nutrition Goals Discharge (Final Nutrition Goals Re-Evaluation):   Psychosocial: Target Goals: Acknowledge presence or absence of significant depression and/or stress, maximize coping skills, provide positive support system. Participant is able to verbalize types and ability to use techniques and skills needed for reducing stress and depression.  Initial Review & Psychosocial Screening:  Initial Psych Review & Screening - 03/02/24 1403       Initial Review   Current issues with None Identified      Family Dynamics   Good Support System? Yes   Marval has her husband for support     Barriers   Psychosocial barriers to participate in program There are no identifiable barriers or psychosocial needs.      Screening Interventions   Interventions Encouraged to exercise          Quality of Life Scores:  Quality of Life - 03/02/24 1407       Quality of Life   Select Quality of Life      Quality of Life Scores   Health/Function Pre 20.7 %    Socioeconomic Pre 30 %    Psych/Spiritual Pre 26.64 %    Family Pre 28.5 %    GLOBAL Pre 24.67 %         Scores of 19 and below usually indicate a poorer quality of life in these areas.  A difference of  2-3 points is a clinically meaningful difference.  A difference of 2-3 points in the total score of the Quality of Life Index has been associated with  significant improvement in overall quality of life, self-image, physical symptoms, and general health in studies assessing change in quality of life.  PHQ-9: Review Flowsheet  More data exists      03/05/2024 03/02/2024 02/25/2023 11/01/2022 10/18/2022  Depression screen PHQ 2/9  Decreased Interest 0 0 0 0 0  Down, Depressed, Hopeless 0 0 0 0 0  PHQ - 2 Score 0 0 0 0 0  Altered sleeping 2 0 0 - -  Tired, decreased energy 0 0 0 - -  Change in appetite 0 3 0 - -  Feeling bad or failure about yourself  0 0 0 - -  Trouble concentrating 0 0 0 - -  Moving slowly or fidgety/restless 0 0 0 - -  Suicidal thoughts 0 0 0 - -  PHQ-9 Score 2 3 0 - -  Difficult doing work/chores Not difficult at all Not difficult at all Not difficult at all - -   Interpretation of Total Score  Total Score Depression Severity:  1-4 = Minimal depression, 5-9 = Mild depression, 10-14 = Moderate depression, 15-19 = Moderately severe depression, 20-27 = Severe depression   Psychosocial Evaluation and Intervention:   Psychosocial Re-Evaluation:  Psychosocial Re-Evaluation     Row Name 03/08/24 1651 03/23/24 1209           Psychosocial Re-Evaluation  Current issues with None Identified None Identified      Interventions Encouraged to attend Cardiac Rehabilitation for the exercise Encouraged to attend Cardiac Rehabilitation for the exercise      Continue Psychosocial Services  No Follow up required No Follow up required         Psychosocial Discharge (Final Psychosocial Re-Evaluation):  Psychosocial Re-Evaluation - 03/23/24 1209       Psychosocial Re-Evaluation   Current issues with None Identified    Interventions Encouraged to attend Cardiac Rehabilitation for the exercise    Continue Psychosocial Services  No Follow up required          Vocational Rehabilitation: Provide vocational rehab assistance to qualifying candidates.   Vocational Rehab Evaluation & Intervention:  Vocational Rehab -  03/02/24 1403       Initial Vocational Rehab Evaluation & Intervention   Assessment shows need for Vocational Rehabilitation No   Marval is recently retired and does not need vocational rehab at this time         Education: Education Goals: Education classes will be provided on a weekly basis, covering required topics. Participant will state understanding/return demonstration of topics presented.    Education     Row Name 03/08/24 1400     Education   Cardiac Education Topics Pritikin   Writer Psychosocial   Psychosocial Healthy Minds, Bodies, Hearts   Instruction Review Code 1- Verbalizes Understanding   Class Start Time 1400   Class Stop Time 1434   Class Time Calculation (min) 34 min    Row Name 03/10/24 1500     Education   Cardiac Education Topics Pritikin   Secondary School Teacher School   Educator Nurse;Respiratory Therapist   Weekly Topic Adding Flavor - Sodium-Free   Instruction Review Code 1- Verbalizes Understanding   Class Start Time 1357   Class Stop Time 1432   Class Time Calculation (min) 35 min    Row Name 03/15/24 1400     Education   Cardiac Education Topics Pritikin   Geographical Information Systems Officer Exercise   Exercise Workshop Location Manager and Fall Prevention   Instruction Review Code 1- Verbalizes Understanding   Class Start Time 1408   Class Stop Time 1500   Class Time Calculation (min) 52 min    Row Name 03/17/24 1500     Education   Cardiac Education Topics Pritikin   Customer Service Manager   Weekly Topic Fast and Healthy Breakfasts   Instruction Review Code 1- Verbalizes Understanding   Class Start Time 1400   Class Stop Time 1442   Class Time Calculation (min) 42 min    Row Name 03/19/24 1400     Education   Cardiac Education Topics Pritikin   Museum/gallery Curator   Educator Dietitian   Select Nutrition   Nutrition Other  Label Reading   Instruction Review Code 1- Verbalizes Understanding   Class Start Time 1358   Class Stop Time 1456   Class Time Calculation (min) 58 min    Row Name 03/22/24 1500     Education   Cardiac Education Topics Pritikin   Nurse, Children's Exercise Physiologist  Select General Education   General Education Metabolic Syndrome and Belly Fat   Instruction Review Code 1- Verbalizes Understanding   Class Start Time 1407   Class Stop Time 1445   Class Time Calculation (min) 38 min      Core Videos: Exercise    Move It!  Clinical staff conducted group or individual video education with verbal and written material and guidebook.  Patient learns the recommended Pritikin exercise program. Exercise with the goal of living a long, healthy life. Some of the health benefits of exercise include controlled diabetes, healthier blood pressure levels, improved cholesterol levels, improved heart and lung capacity, improved sleep, and better body composition. Everyone should speak with their doctor before starting or changing an exercise routine.  Biomechanical Limitations Clinical staff conducted group or individual video education with verbal and written material and guidebook.  Patient learns how biomechanical limitations can impact exercise and how we can mitigate and possibly overcome limitations to have an impactful and balanced exercise routine.  Body Composition Clinical staff conducted group or individual video education with verbal and written material and guidebook.  Patient learns that body composition (ratio of muscle mass to fat mass) is a key component to assessing overall fitness, rather than body weight alone. Increased fat mass, especially visceral belly fat, can put us  at increased risk for metabolic syndrome, type 2 diabetes, heart disease, and even death. It is  recommended to combine diet and exercise (cardiovascular and resistance training) to improve your body composition. Seek guidance from your physician and exercise physiologist before implementing an exercise routine.  Exercise Action Plan Clinical staff conducted group or individual video education with verbal and written material and guidebook.  Patient learns the recommended strategies to achieve and enjoy long-term exercise adherence, including variety, self-motivation, self-efficacy, and positive decision making. Benefits of exercise include fitness, good health, weight management, more energy, better sleep, less stress, and overall well-being.  Medical   Heart Disease Risk Reduction Clinical staff conducted group or individual video education with verbal and written material and guidebook.  Patient learns our heart is our most vital organ as it circulates oxygen, nutrients, white blood cells, and hormones throughout the entire body, and carries waste away. Data supports a plant-based eating plan like the Pritikin Program for its effectiveness in slowing progression of and reversing heart disease. The video provides a number of recommendations to address heart disease.   Metabolic Syndrome and Belly Fat  Clinical staff conducted group or individual video education with verbal and written material and guidebook.  Patient learns what metabolic syndrome is, how it leads to heart disease, and how one can reverse it and keep it from coming back. You have metabolic syndrome if you have 3 of the following 5 criteria: abdominal obesity, high blood pressure, high triglycerides, low HDL cholesterol, and high blood sugar.  Hypertension and Heart Disease Clinical staff conducted group or individual video education with verbal and written material and guidebook.  Patient learns that high blood pressure, or hypertension, is very common in the [place] . Hypertension is largely due to excessive salt  intake, but other important risk factors include being overweight, physical inactivity, drinking too much alcohol, smoking, and not eating enough potassium from fruits and vegetables. High blood pressure is a leading risk factor for heart attack, stroke, congestive heart failure, dementia, kidney failure, and premature death. Long-term effects of excessive salt intake include stiffening of the arteries and thickening of heart muscle and organ damage. Recommendations include ways to  reduce hypertension and the risk of heart disease.  Diseases of Our Time - Focusing on Diabetes Clinical staff conducted group or individual video education with verbal and written material and guidebook.  Patient learns why the best way to stop diseases of our time is prevention, through food and other lifestyle changes. Medicine (such as prescription pills and surgeries) is often only a Band-Aid on the problem, not a long-term solution. Most common diseases of our time include obesity, type 2 diabetes, hypertension, heart disease, and cancer. The Pritikin Program is recommended and has been proven to help reduce, reverse, and/or prevent the damaging effects of metabolic syndrome.  Nutrition   Overview of the Pritikin Eating Plan  Clinical staff conducted group or individual video education with verbal and written material and guidebook.  Patient learns about the Pritikin Eating Plan for disease risk reduction. The Pritikin Eating Plan emphasizes a wide variety of unrefined, minimally-processed carbohydrates, like fruits, vegetables, whole grains, and legumes. Go, Caution, and Stop food choices are explained. Plant-based and lean animal proteins are emphasized. Rationale provided for low sodium intake for blood pressure control, low added sugars for blood sugar stabilization, and low added fats and oils for coronary artery disease risk reduction and weight management.  Calorie Density  Clinical staff conducted group or  individual video education with verbal and written material and guidebook.  Patient learns about calorie density and how it impacts the Pritikin Eating Plan. Knowing the characteristics of the food you choose will help you decide whether those foods will lead to weight gain or weight loss, and whether you want to consume more or less of them. Weight loss is usually a side effect of the Pritikin Eating Plan because of its focus on low calorie-dense foods.  Label Reading  Clinical staff conducted group or individual video education with verbal and written material and guidebook.  Patient learns about the Pritikin recommended label reading guidelines and corresponding recommendations regarding calorie density, added sugars, sodium content, and whole grains.  Dining Out - Part 1  Clinical staff conducted group or individual video education with verbal and written material and guidebook.  Patient learns that restaurant meals can be sabotaging because they can be so high in calories, fat, sodium, and/or sugar. Patient learns recommended strategies on how to positively address this and avoid unhealthy pitfalls.  Facts on Fats  Clinical staff conducted group or individual video education with verbal and written material and guidebook.  Patient learns that lifestyle modifications can be just as effective, if not more so, as many medications for lowering your risk of heart disease. A Pritikin lifestyle can help to reduce your risk of inflammation and atherosclerosis (cholesterol build-up, or plaque, in the artery walls). Lifestyle interventions such as dietary choices and physical activity address the cause of atherosclerosis. A review of the types of fats and their impact on blood cholesterol levels, along with dietary recommendations to reduce fat intake is also included.  Nutrition Action Plan  Clinical staff conducted group or individual video education with verbal and written material and guidebook.   Patient learns how to incorporate Pritikin recommendations into their lifestyle. Recommendations include planning and keeping personal health goals in mind as an important part of their success.  Healthy Mind-Set    Healthy Minds, Bodies, Hearts  Clinical staff conducted group or individual video education with verbal and written material and guidebook.  Patient learns how to identify when they are stressed. Video will discuss the impact of that stress, as well  as the many benefits of stress management. Patient will also be introduced to stress management techniques. The way we think, act, and feel has an impact on our hearts.  How Our Thoughts Can Heal Our Hearts  Clinical staff conducted group or individual video education with verbal and written material and guidebook.  Patient learns that negative thoughts can cause depression and anxiety. This can result in negative lifestyle behavior and serious health problems. Cognitive behavioral therapy is an effective method to help control our thoughts in order to change and improve our emotional outlook.  Additional Videos:  Exercise    Improving Performance  Clinical staff conducted group or individual video education with verbal and written material and guidebook.  Patient learns to use a non-linear approach by alternating intensity levels and lengths of time spent exercising to help burn more calories and lose more body fat. Cardiovascular exercise helps improve heart health, metabolism, hormonal balance, blood sugar control, and recovery from fatigue. Resistance training improves strength, endurance, balance, coordination, reaction time, metabolism, and muscle mass. Flexibility exercise improves circulation, posture, and balance. Seek guidance from your physician and exercise physiologist before implementing an exercise routine and learn your capabilities and proper form for all exercise.  Introduction to Yoga  Clinical staff conducted group or  individual video education with verbal and written material and guidebook.  Patient learns about yoga, a discipline of the coming together of mind, breath, and body. The benefits of yoga include improved flexibility, improved range of motion, better posture and core strength, increased lung function, weight loss, and positive self-image. Yoga's heart health benefits include lowered blood pressure, healthier heart rate, decreased cholesterol and triglyceride levels, improved immune function, and reduced stress. Seek guidance from your physician and exercise physiologist before implementing an exercise routine and learn your capabilities and proper form for all exercise.  Medical   Aging: Enhancing Your Quality of Life  Clinical staff conducted group or individual video education with verbal and written material and guidebook.  Patient learns key strategies and recommendations to stay in good physical health and enhance quality of life, such as prevention strategies, having an advocate, securing a Health Care Proxy and Power of Attorney, and keeping a list of medications and system for tracking them. It also discusses how to avoid risk for bone loss.  Biology of Weight Control  Clinical staff conducted group or individual video education with verbal and written material and guidebook.  Patient learns that weight gain occurs because we consume more calories than we burn (eating more, moving less). Even if your body weight is normal, you may have higher ratios of fat compared to muscle mass. Too much body fat puts you at increased risk for cardiovascular disease, heart attack, stroke, type 2 diabetes, and obesity-related cancers. In addition to exercise, following the Pritikin Eating Plan can help reduce your risk.  Decoding Lab Results  Clinical staff conducted group or individual video education with verbal and written material and guidebook.  Patient learns that lab test reflects one measurement whose  values change over time and are influenced by many factors, including medication, stress, sleep, exercise, food, hydration, pre-existing medical conditions, and more. It is recommended to use the knowledge from this video to become more involved with your lab results and evaluate your numbers to speak with your doctor.   Diseases of Our Time - Overview  Clinical staff conducted group or individual video education with verbal and written material and guidebook.  Patient learns that according to the  CDC, 50% to 70% of chronic diseases (such as obesity, type 2 diabetes, elevated lipids, hypertension, and heart disease) are avoidable through lifestyle improvements including healthier food choices, listening to satiety cues, and increased physical activity.  Sleep Disorders Clinical staff conducted group or individual video education with verbal and written material and guidebook.  Patient learns how good quality and duration of sleep are important to overall health and well-being. Patient also learns about sleep disorders and how they impact health along with recommendations to address them, including discussing with a physician.  Nutrition  Dining Out - Part 2 Clinical staff conducted group or individual video education with verbal and written material and guidebook.  Patient learns how to plan ahead and communicate in order to maximize their dining experience in a healthy and nutritious manner. Included are recommended food choices based on the type of restaurant the patient is visiting.   Fueling a Banker conducted group or individual video education with verbal and written material and guidebook.  There is a strong connection between our food choices and our health. Diseases like obesity and type 2 diabetes are very prevalent and are in large-part due to lifestyle choices. The Pritikin Eating Plan provides plenty of food and hunger-curbing satisfaction. It is easy to follow,  affordable, and helps reduce health risks.  Menu Workshop  Clinical staff conducted group or individual video education with verbal and written material and guidebook.  Patient learns that restaurant meals can sabotage health goals because they are often packed with calories, fat, sodium, and sugar. Recommendations include strategies to plan ahead and to communicate with the manager, chef, or server to help order a healthier meal.  Planning Your Eating Strategy  Clinical staff conducted group or individual video education with verbal and written material and guidebook.  Patient learns about the Pritikin Eating Plan and its benefit of reducing the risk of disease. The Pritikin Eating Plan does not focus on calories. Instead, it emphasizes high-quality, nutrient-rich foods. By knowing the characteristics of the foods, we choose, we can determine their calorie density and make informed decisions.  Targeting Your Nutrition Priorities  Clinical staff conducted group or individual video education with verbal and written material and guidebook.  Patient learns that lifestyle habits have a tremendous impact on disease risk and progression. This video provides eating and physical activity recommendations based on your personal health goals, such as reducing LDL cholesterol, losing weight, preventing or controlling type 2 diabetes, and reducing high blood pressure.  Vitamins and Minerals  Clinical staff conducted group or individual video education with verbal and written material and guidebook.  Patient learns different ways to obtain key vitamins and minerals, including through a recommended healthy diet. It is important to discuss all supplements you take with your doctor.   Healthy Mind-Set    Smoking Cessation  Clinical staff conducted group or individual video education with verbal and written material and guidebook.  Patient learns that cigarette smoking and tobacco addiction pose a serious health  risk which affects millions of people. Stopping smoking will significantly reduce the risk of heart disease, lung disease, and many forms of cancer. Recommended strategies for quitting are covered, including working with your doctor to develop a successful plan.  Culinary   Becoming a Set Designer conducted group or individual video education with verbal and written material and guidebook.  Patient learns that cooking at home can be healthy, cost-effective, quick, and puts them in control. Keys to  cooking healthy recipes will include looking at your recipe, assessing your equipment needs, planning ahead, making it simple, choosing cost-effective seasonal ingredients, and limiting the use of added fats, salts, and sugars.  Cooking - Breakfast and Snacks  Clinical staff conducted group or individual video education with verbal and written material and guidebook.  Patient learns how important breakfast is to satiety and nutrition through the entire day. Recommendations include key foods to eat during breakfast to help stabilize blood sugar levels and to prevent overeating at meals later in the day. Planning ahead is also a key component.  Cooking - Educational Psychologist conducted group or individual video education with verbal and written material and guidebook.  Patient learns eating strategies to improve overall health, including an approach to cook more at home. Recommendations include thinking of animal protein as a side on your plate rather than center stage and focusing instead on lower calorie dense options like vegetables, fruits, whole grains, and plant-based proteins, such as beans. Making sauces in large quantities to freeze for later and leaving the skin on your vegetables are also recommended to maximize your experience.  Cooking - Healthy Salads and Dressing Clinical staff conducted group or individual video education with verbal and written material and  guidebook.  Patient learns that vegetables, fruits, whole grains, and legumes are the foundations of the Pritikin Eating Plan. Recommendations include how to incorporate each of these in flavorful and healthy salads, and how to create homemade salad dressings. Proper handling of ingredients is also covered. Cooking - Soups and [employer] - Soups and Desserts Clinical staff conducted group or individual video education with verbal and written material and guidebook.  Patient learns that Pritikin soups and desserts make for easy, nutritious, and delicious snacks and meal components that are low in sodium, fat, sugar, and calorie density, while high in vitamins, minerals, and filling fiber. Recommendations include simple and healthy ideas for soups and desserts.   Overview     The Pritikin Solution Program Overview Clinical staff conducted group or individual video education with verbal and written material and guidebook.  Patient learns that the results of the Pritikin Program have been documented in more than 100 articles published in peer-reviewed journals, and the benefits include reducing risk factors for (and, in some cases, even reversing) high cholesterol, high blood pressure, type 2 diabetes, obesity, and more! An overview of the three key pillars of the Pritikin Program will be covered: eating well, doing regular exercise, and having a healthy mind-set.  WORKSHOPS  Exercise: Exercise Basics: Building Your Action Plan Clinical staff led group instruction and group discussion with PowerPoint presentation and patient guidebook. To enhance the learning environment the use of posters, models and videos may be added. At the conclusion of this workshop, patients will comprehend the difference between physical activity and exercise, as well as the benefits of incorporating both, into their routine. Patients will understand the FITT (Frequency, Intensity, Time, and Type) principle and how to  use it to build an exercise action plan. In addition, safety concerns and other considerations for exercise and cardiac rehab will be addressed by the presenter. The purpose of this lesson is to promote a comprehensive and effective weekly exercise routine in order to improve patients' overall level of fitness.   Managing Heart Disease: Your Path to a Healthier Heart Clinical staff led group instruction and group discussion with PowerPoint presentation and patient guidebook. To enhance the learning environment the use of posters,  models and videos may be added.At the conclusion of this workshop, patients will understand the anatomy and physiology of the heart. Additionally, they will understand how Pritikin's three pillars impact the risk factors, the progression, and the management of heart disease.  The purpose of this lesson is to provide a high-level overview of the heart, heart disease, and how the Pritikin lifestyle positively impacts risk factors.  Exercise Biomechanics Clinical staff led group instruction and group discussion with PowerPoint presentation and patient guidebook. To enhance the learning environment the use of posters, models and videos may be added. Patients will learn how the structural parts of their bodies function and how these functions impact their daily activities, movement, and exercise. Patients will learn how to promote a neutral spine, learn how to manage pain, and identify ways to improve their physical movement in order to promote healthy living. The purpose of this lesson is to expose patients to common physical limitations that impact physical activity. Participants will learn practical ways to adapt and manage aches and pains, and to minimize their effect on regular exercise. Patients will learn how to maintain good posture while sitting, walking, and lifting.  Balance Training and Fall Prevention  Clinical staff led group instruction and group discussion  with PowerPoint presentation and patient guidebook. To enhance the learning environment the use of posters, models and videos may be added. At the conclusion of this workshop, patients will understand the importance of their sensorimotor skills (vision, proprioception, and the vestibular system) in maintaining their ability to balance as they age. Patients will apply a variety of balancing exercises that are appropriate for their current level of function. Patients will understand the common causes for poor balance, possible solutions to these problems, and ways to modify their physical environment in order to minimize their fall risk. The purpose of this lesson is to teach patients about the importance of maintaining balance as they age and ways to minimize their risk of falling.  WORKSHOPS   Nutrition:  Fueling a Ship Broker led group instruction and group discussion with PowerPoint presentation and patient guidebook. To enhance the learning environment the use of posters, models and videos may be added. Patients will review the foundational principles of the Pritikin Eating Plan and understand what constitutes a serving size in each of the food groups. Patients will also learn Pritikin-friendly foods that are better choices when away from home and review make-ahead meal and snack options. Calorie density will be reviewed and applied to three nutrition priorities: weight maintenance, weight loss, and weight gain. The purpose of this lesson is to reinforce (in a group setting) the key concepts around what patients are recommended to eat and how to apply these guidelines when away from home by planning and selecting Pritikin-friendly options. Patients will understand how calorie density may be adjusted for different weight management goals.  Mindful Eating  Clinical staff led group instruction and group discussion with PowerPoint presentation and patient guidebook. To enhance the  learning environment the use of posters, models and videos may be added. Patients will briefly review the concepts of the Pritikin Eating Plan and the importance of low-calorie dense foods. The concept of mindful eating will be introduced as well as the importance of paying attention to internal hunger signals. Triggers for non-hunger eating and techniques for dealing with triggers will be explored. The purpose of this lesson is to provide patients with the opportunity to review the basic principles of the Pritikin Eating Plan, discuss the  value of eating mindfully and how to measure internal cues of hunger and fullness using the Hunger Scale. Patients will also discuss reasons for non-hunger eating and learn strategies to use for controlling emotional eating.  Targeting Your Nutrition Priorities Clinical staff led group instruction and group discussion with PowerPoint presentation and patient guidebook. To enhance the learning environment the use of posters, models and videos may be added. Patients will learn how to determine their genetic susceptibility to disease by reviewing their family history. Patients will gain insight into the importance of diet as part of an overall healthy lifestyle in mitigating the impact of genetics and other environmental insults. The purpose of this lesson is to provide patients with the opportunity to assess their personal nutrition priorities by looking at their family history, their own health history and current risk factors. Patients will also be able to discuss ways of prioritizing and modifying the Pritikin Eating Plan for their highest risk areas  Menu  Clinical staff led group instruction and group discussion with PowerPoint presentation and patient guidebook. To enhance the learning environment the use of posters, models and videos may be added. Using menus brought in from [employer], or printed from [employer], patients will apply the Pritikin dining out  guidelines that were presented in the [employer] video. Patients will also be able to practice these guidelines in a variety of provided scenarios. The purpose of this lesson is to provide patients with the opportunity to practice hands-on learning of the Pritikin Dining Out guidelines with actual menus and practice scenarios.  Label Reading Clinical staff led group instruction and group discussion with PowerPoint presentation and patient guidebook. To enhance the learning environment the use of posters, models and videos may be added. Patients will review and discuss the Pritikin label reading guidelines presented in Pritikin's Label Reading Educational series video. Using fool labels brought in from local grocery stores and markets, patients will apply the label reading guidelines and determine if the packaged food meet the Pritikin guidelines. The purpose of this lesson is to provide patients with the opportunity to review, discuss, and practice hands-on learning of the Pritikin Label Reading guidelines with actual packaged food labels. Cooking School  Pritikin's [employer] are designed to teach patients ways to prepare quick, simple, and affordable recipes at home. The importance of nutrition's role in chronic disease risk reduction is reflected in its emphasis in the overall Pritikin program. By learning how to prepare essential core Pritikin Eating Plan recipes, patients will increase control over what they eat; be able to customize the flavor of foods without the use of added salt, sugar, or fat; and improve the quality of the food they consume. By learning a set of core recipes which are easily assembled, quickly prepared, and affordable, patients are more likely to prepare more healthy foods at home. These workshops focus on convenient breakfasts, simple entres, side dishes, and desserts which can be prepared with minimal effort and are consistent with nutrition  recommendations for cardiovascular risk reduction. Cooking [employer] are taught by a armed forces logistics/support/administrative officer (RD) who has been trained by the [employer]. The chef or RD has a clear understanding of the importance of minimizing - if not completely eliminating - added fat, sugar, and sodium in recipes. Throughout the series of Cooking School Workshop sessions, patients will learn about healthy ingredients and efficient methods of cooking to build confidence in their capability to prepare    Cooking  School weekly topics:  Adding Flavor- Sodium-Free  Fast and Healthy Breakfasts  Powerhouse Plant-Based Proteins  Satisfying Salads and Dressings  Simple Sides and Sauces  International Cuisine-Spotlight on the Blue Zones  Delicious Desserts  Savory Soups  Efficiency Cooking - Meals in a Snap  Tasty Appetizers and Snacks  Comforting Weekend Breakfasts  One-Pot Wonders   Fast Evening Meals  Landscape Architect Your Pritikin Plate  WORKSHOPS   Healthy Mindset (Psychosocial):  Focused Goals, Sustainable Changes Clinical staff led group instruction and group discussion with PowerPoint presentation and patient guidebook. To enhance the learning environment the use of posters, models and videos may be added. Patients will be able to apply effective goal setting strategies to establish at least one personal goal, and then take consistent, meaningful action toward that goal. They will learn to identify common barriers to achieving personal goals and develop strategies to overcome them. Patients will also gain an understanding of how our mind-set can impact our ability to achieve goals and the importance of cultivating a positive and growth-oriented mind-set. The purpose of this lesson is to provide patients with a deeper understanding of how to set and achieve personal goals, as well as the tools and strategies needed to overcome common obstacles which may arise along  the way.  From Head to Heart: The Power of a Healthy Outlook  Clinical staff led group instruction and group discussion with PowerPoint presentation and patient guidebook. To enhance the learning environment the use of posters, models and videos may be added. Patients will be able to recognize and describe the impact of emotions and mood on physical health. They will discover the importance of self-care and explore self-care practices which may work for them. Patients will also learn how to utilize the 4 C's to cultivate a healthier outlook and better manage stress and challenges. The purpose of this lesson is to demonstrate to patients how a healthy outlook is an essential part of maintaining good health, especially as they continue their cardiac rehab journey.  Healthy Sleep for a Healthy Heart Clinical staff led group instruction and group discussion with PowerPoint presentation and patient guidebook. To enhance the learning environment the use of posters, models and videos may be added. At the conclusion of this workshop, patients will be able to demonstrate knowledge of the importance of sleep to overall health, well-being, and quality of life. They will understand the symptoms of, and treatments for, common sleep disorders. Patients will also be able to identify daytime and nighttime behaviors which impact sleep, and they will be able to apply these tools to help manage sleep-related challenges. The purpose of this lesson is to provide patients with a general overview of sleep and outline the importance of quality sleep. Patients will learn about a few of the most common sleep disorders. Patients will also be introduced to the concept of "sleep hygiene," and discover ways to self-manage certain sleeping problems through simple daily behavior changes. Finally, the workshop will motivate patients by clarifying the links between quality sleep and their goals of heart-healthy living.   Recognizing and  Reducing Stress Clinical staff led group instruction and group discussion with PowerPoint presentation and patient guidebook. To enhance the learning environment the use of posters, models and videos may be added. At the conclusion of this workshop, patients will be able to understand the types of stress reactions, differentiate between acute and chronic stress, and recognize the impact that chronic stress has on their health. They will also  be able to apply different coping mechanisms, such as reframing negative self-talk. Patients will have the opportunity to practice a variety of stress management techniques, such as deep abdominal breathing, progressive muscle relaxation, and/or guided imagery.  The purpose of this lesson is to educate patients on the role of stress in their lives and to provide healthy techniques for coping with it.  Learning Barriers/Preferences:  Learning Barriers/Preferences - 03/02/24 1545       Learning Barriers/Preferences   Learning Barriers Sight    Learning Preferences Individual Instruction;Group Instruction;Computer/Internet          Education Topics:  Knowledge Questionnaire Score:  Knowledge Questionnaire Score - 03/02/24 1410       Knowledge Questionnaire Score   Pre Score 23/24          Core Components/Risk Factors/Patient Goals at Admission:  Personal Goals and Risk Factors at Admission - 03/02/24 1546       Core Components/Risk Factors/Patient Goals on Admission    Weight Management Yes;Obesity;Weight Loss    Intervention Weight Management: Develop a combined nutrition and exercise program designed to reach desired caloric intake, while maintaining appropriate intake of nutrient and fiber, sodium and fats, and appropriate energy expenditure required for the weight goal.;Weight Management: Provide education and appropriate resources to help participant work on and attain dietary goals.;Weight Management/Obesity: Establish reasonable short term  and long term weight goals.;Obesity: Provide education and appropriate resources to help participant work on and attain dietary goals.    Admit Weight 268 lb 15.4 oz (122 kg)    Expected Outcomes Short Term: Continue to assess and modify interventions until short term weight is achieved;Long Term: Adherence to nutrition and physical activity/exercise program aimed toward attainment of established weight goal;Weight Loss: Understanding of general recommendations for a balanced deficit meal plan, which promotes 1-2 lb weight loss per week and includes a negative energy balance of [phone] kcal/d;Understanding recommendations for meals to include 15-35% energy as protein, 25-35% energy from fat, 35-60% energy from carbohydrates, less than 200mg  of dietary cholesterol, 20-35 gm of total fiber daily;Understanding of distribution of calorie intake throughout the day with the consumption of 4-5 meals/snacks    Lipids Yes    Intervention Provide education and support for participant on nutrition & aerobic/resistive exercise along with prescribed medications to achieve LDL 70mg , HDL >40mg .    Expected Outcomes Short Term: Participant states understanding of desired cholesterol values and is compliant with medications prescribed. Participant is following exercise prescription and nutrition guidelines.;Long Term: Cholesterol controlled with medications as prescribed, with individualized exercise RX and with personalized nutrition plan. Value goals: LDL < 70mg , HDL > 40 mg.          Core Components/Risk Factors/Patient Goals Review:   Goals and Risk Factor Review     Row Name 03/08/24 1655             Core Components/Risk Factors/Patient Goals Review   Personal Goals Review Weight Management/Obesity;Lipids       Review Debbie started cardiac rehab on 03/08/24. Debbie did fair with exercise for her fitness level. Debbie's vital were stable. Marval is deconditoned.       Expected Outcomes Marval will  continue to participate in cardiac rehab for exercise, nutrition and lifestyle modifications.          Core Components/Risk Factors/Patient Goals at Discharge (Final Review):   Goals and Risk Factor Review - 03/08/24 1655       Core Components/Risk Factors/Patient Goals Review   Personal Goals Review  Weight Management/Obesity;Lipids    Review Debbie started cardiac rehab on 03/08/24. Debbie did fair with exercise for her fitness level. Debbie's vital were stable. Marval is deconditoned.    Expected Outcomes Marval will continue to participate in cardiac rehab for exercise, nutrition and lifestyle modifications.          ITP Comments:  ITP Comments     Row Name 03/02/24 1327 03/08/24 1650 03/23/24 1208       ITP Comments Wilbert Bihari, MD: Medical Director. Introduction to the [facility]/Intensive Cardiac Rehab. Initial orientation packet reviewed with the patient. 30 Day ITP Review. Debbie started cardiac rehab on 03/08/24. Debbie did fair for her fitness level. Marval says she is motivated to participate in the program. 30 Day ITP Review. Debbie started cardiac rehab on 03/08/24. Marval is off to a good start to exercise for her fitness level.        Comments: See ITP comments.Hadassah Elpidio Quan RN BSN

## 2024-03-24 ENCOUNTER — Encounter (HOSPITAL_COMMUNITY)
Admission: RE | Admit: 2024-03-24 | Discharge: 2024-03-24 | Disposition: A | Discharge status: Home / Self Care | Source: Ambulatory Visit | Attending: Internal Medicine | Admitting: Internal Medicine

## 2024-03-24 DIAGNOSIS — Z952 Presence of prosthetic heart valve: Secondary | ICD-10-CM | POA: Diagnosis not present

## 2024-03-26 ENCOUNTER — Encounter (HOSPITAL_COMMUNITY)
Admission: RE | Admit: 2024-03-26 | Discharge: 2024-03-26 | Disposition: A | Discharge status: Home / Self Care | Source: Ambulatory Visit | Attending: Internal Medicine | Admitting: Internal Medicine

## 2024-03-26 DIAGNOSIS — Z952 Presence of prosthetic heart valve: Secondary | ICD-10-CM

## 2024-03-29 ENCOUNTER — Encounter (HOSPITAL_COMMUNITY)
Admission: RE | Admit: 2024-03-29 | Discharge: 2024-03-29 | Disposition: A | Source: Ambulatory Visit | Attending: Internal Medicine

## 2024-03-29 DIAGNOSIS — Z952 Presence of prosthetic heart valve: Secondary | ICD-10-CM | POA: Diagnosis not present

## 2024-03-31 ENCOUNTER — Encounter (HOSPITAL_COMMUNITY)
Admission: RE | Admit: 2024-03-31 | Discharge: 2024-03-31 | Disposition: A | Discharge status: Home / Self Care | Source: Ambulatory Visit | Attending: Internal Medicine | Admitting: Internal Medicine

## 2024-03-31 DIAGNOSIS — Z952 Presence of prosthetic heart valve: Secondary | ICD-10-CM | POA: Diagnosis not present

## 2024-04-01 ENCOUNTER — Ambulatory Visit
Admission: RE | Admit: 2024-04-01 | Discharge: 2024-04-01 | Disposition: A | Source: Ambulatory Visit | Attending: Nurse Practitioner | Admitting: Nurse Practitioner

## 2024-04-01 DIAGNOSIS — Z1231 Encounter for screening mammogram for malignant neoplasm of breast: Secondary | ICD-10-CM

## 2024-04-02 ENCOUNTER — Encounter (HOSPITAL_COMMUNITY)
Admission: RE | Admit: 2024-04-02 | Discharge: 2024-04-02 | Disposition: A | Discharge status: Home / Self Care | Source: Ambulatory Visit | Attending: Internal Medicine | Admitting: Internal Medicine

## 2024-04-02 DIAGNOSIS — Z952 Presence of prosthetic heart valve: Secondary | ICD-10-CM | POA: Diagnosis not present

## 2024-04-05 ENCOUNTER — Encounter (HOSPITAL_COMMUNITY)
Admission: RE | Admit: 2024-04-05 | Discharge: 2024-04-05 | Disposition: A | Source: Ambulatory Visit | Attending: Internal Medicine

## 2024-04-05 DIAGNOSIS — Z952 Presence of prosthetic heart valve: Secondary | ICD-10-CM

## 2024-04-07 ENCOUNTER — Encounter (HOSPITAL_COMMUNITY)
Admission: RE | Admit: 2024-04-07 | Discharge: 2024-04-07 | Disposition: A | Discharge status: Home / Self Care | Source: Ambulatory Visit | Attending: Internal Medicine | Admitting: Internal Medicine

## 2024-04-07 DIAGNOSIS — Z952 Presence of prosthetic heart valve: Secondary | ICD-10-CM | POA: Diagnosis not present

## 2024-04-09 ENCOUNTER — Encounter (HOSPITAL_COMMUNITY)
Admission: RE | Admit: 2024-04-09 | Discharge: 2024-04-09 | Disposition: A | Discharge status: Home / Self Care | Source: Ambulatory Visit | Attending: Internal Medicine | Admitting: Internal Medicine

## 2024-04-09 DIAGNOSIS — Z952 Presence of prosthetic heart valve: Secondary | ICD-10-CM

## 2024-04-12 ENCOUNTER — Encounter (HOSPITAL_COMMUNITY)
Admission: RE | Admit: 2024-04-12 | Discharge: 2024-04-12 | Disposition: A | Discharge status: Home / Self Care | Source: Ambulatory Visit | Attending: Internal Medicine | Admitting: Internal Medicine

## 2024-04-12 DIAGNOSIS — Z952 Presence of prosthetic heart valve: Secondary | ICD-10-CM | POA: Diagnosis not present

## 2024-04-14 ENCOUNTER — Encounter (HOSPITAL_COMMUNITY)
Admission: RE | Admit: 2024-04-14 | Discharge: 2024-04-14 | Disposition: A | Source: Ambulatory Visit | Attending: Internal Medicine

## 2024-04-14 DIAGNOSIS — Z952 Presence of prosthetic heart valve: Secondary | ICD-10-CM | POA: Diagnosis not present

## 2024-04-16 ENCOUNTER — Encounter (HOSPITAL_COMMUNITY)

## 2024-04-19 ENCOUNTER — Encounter (HOSPITAL_COMMUNITY)
Admission: RE | Admit: 2024-04-19 | Discharge: 2024-04-19 | Disposition: A | Source: Ambulatory Visit | Attending: Internal Medicine

## 2024-04-19 DIAGNOSIS — Z952 Presence of prosthetic heart valve: Secondary | ICD-10-CM | POA: Insufficient documentation

## 2024-04-19 NOTE — Progress Notes (Signed)
 CARDIAC REHAB PHASE 2  Reviewed home exercise with pt today. Pt is tolerating exercise well. Pt will continue to exercise on her own by using her recumbent bike and chair fitness classes on youtube for 30 minutes per session (broken up into smaller bouts as needed) 1-2 days a week in addition to the 3 days in [facility]. Advised pt on THRR, RPE scale, hydration and temperature/humidity precautions. Reinforced S/S to stop exercise and when to call MD vs 911. Encouraged warm up cool down and stretches with exercise sessions. Pt verbalized understanding, all questions were answered and pt was given a copy to take home.    Zyniah Ferraiolo S Euretha Najarro ACSM-CEP 04/19/2024 4:42 PM

## 2024-04-20 NOTE — Progress Notes (Signed)
 Cardiac Individual Treatment Plan  Patient Details  Name: Charlene Tapia MRN: [id number] Date of Birth: 06-16-1950 Referring Provider:   Flowsheet Row INTENSIVE CARDIAC REHAB ORIENT from 03/02/2024 in [facility] for Heart, Vascular, & Lung Health  Referring Provider Vina Gull, MD    Initial Encounter Date:  Flowsheet Row INTENSIVE CARDIAC REHAB ORIENT from 03/02/2024 in [facility] for Heart, Vascular, & Lung Health  Date 03/02/24    Visit Diagnosis: 02/04/23 S/P TAVR (transcatheter aortic valve replacement)  Patient's Home Medications on Admission:  Current Outpatient Medications:    aspirin  EC 81 MG tablet, Take 1 tablet (81 mg total) by mouth daily. Swallow whole., Disp: 120 tablet, Rfl: 3   fluticasone  (FLONASE ) 50 MCG/ACT nasal spray, Place 2 sprays into both nostrils daily as needed for allergies or rhinitis., Disp: , Rfl:    Ipratropium-Albuterol  (COMBIVENT) 20-100 MCG/ACT AERS respimat, Inhale 2 puffs into the lungs every 6 (six) hours as needed for wheezing or shortness of breath., Disp: 4 g, Rfl: 1   losartan  (COZAAR ) 25 MG tablet, Take 1 tablet (25 mg total) by mouth daily., Disp: 90 tablet, Rfl: 3   metoprolol  succinate (TOPROL  XL) 25 MG 24 hr tablet, Take 1 tablet (25 mg total) by mouth at bedtime., Disp: 90 tablet, Rfl: 3   Multiple Vitamins-Minerals (MULTIVITAMIN GUMMIES WOMENS PO), Take 2 each by mouth daily after supper., Disp: , Rfl:    rosuvastatin  (CRESTOR ) 5 MG tablet, Take 1 tablet (5 mg total) by mouth every other day., Disp: 90 tablet, Rfl: 3   spironolactone  (ALDACTONE ) 25 MG tablet, Take 1 tablet (25 mg total) by mouth daily., Disp: 90 tablet, Rfl: 3   torsemide  (DEMADEX ) 20 MG tablet, Take 1 tablet (20 mg total) by mouth daily., Disp: 90 tablet, Rfl: 3  Past Medical History: Past Medical History:  Diagnosis Date   Allergy    High cholesterol    HTN (hypertension) 10/18/2022   Osteoarthritis    knees;  thumbs (10/01/2013)   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 Wendel and Dr. Maryjane   Severe aortic stenosis     Tobacco Use: Social History   Tobacco Use  Smoking Status Never  Smokeless Tobacco Never    Labs: Review Flowsheet  More data exists      Latest Ref Rng & Units 11/25/2022 02/04/2023 07/03/2023 07/21/2023 02/02/2024  Labs for ITP Cardiac and Pulmonary Rehab  Cholestrol 100 - 199 mg/dL - - - 792  848   LDL (calc) 0 - 99 mg/dL - - - 857  85   HDL-C >60 mg/dL - - - 43  48   Trlycerides 0 - 149 mg/dL - - - 877  94   PH, Arterial 7.35 - 7.45 7.366  - - - -  PCO2 arterial 32 - 48 mmHg 45.2  - - - -  Bicarbonate 20.0 - 28.0 mmol/L 19.0  26.1  25.9  - - - -  TCO2 22 - 32 mmol/L 20  28  27  23  24   - -  Acid-base deficit 0.0 - 2.0 mmol/L 8.0  - - - -  O2 Saturation % 73  77  92  - - - -    Details       Multiple values from one day are sorted in reverse-chronological order         Capillary  Blood Glucose: Lab Results  Component Value Date   GLUCAP 131 (H) 08/17/2013     Exercise Target Goals: Exercise Program Goal: Individual exercise prescription set using results from initial 6 min walk test and THRR while considering  patient's activity barriers and safety.   Exercise Prescription Goal: Initial exercise prescription builds to 30-45 minutes a day of aerobic activity, 2-3 days per week.  Home exercise guidelines will be given to patient during program as part of exercise prescription that the participant will acknowledge.  Activity Barriers & Risk Stratification:  Activity Barriers & Cardiac Risk Stratification - 03/02/24 1411       Activity Barriers & Cardiac Risk Stratification   Activity Barriers Shortness of Breath;Muscular Weakness;Deconditioning;Left Knee Replacement;Right Knee Replacement;Arthritis;Balance Concerns;History of Falls;Assistive Device    Cardiac  Risk Stratification High          6 Minute Walk:  6 Minute Walk     Row Name 03/02/24 1540         6 Minute Walk   Phase Initial     Distance 955 feet     Walk Time 6 minutes     # of Rest Breaks 0     MPH 1.8     METS 1.2     RPE 11     Perceived Dyspnea  1     VO2 Peak 3.6     Symptoms Yes (comment)     Comments Mild SOB, RPD =1 resolved with rest     Resting HR 74 bpm     Resting BP 106/64     Resting Oxygen Saturation  95 %     Exercise Oxygen Saturation  during 6 min walk 95 %     Max Ex. HR 85 bpm     Max Ex. BP 152/76     2 Minute Post BP 130/64        Oxygen Initial Assessment:   Oxygen Re-Evaluation:   Oxygen Discharge (Final Oxygen Re-Evaluation):   Initial Exercise Prescription:  Initial Exercise Prescription - 03/02/24 1500       Date of Initial Exercise RX and Referring Provider   Date 03/02/24    Referring Provider Vina Gull, MD    Expected Discharge Date 05/26/24      NuStep   Level 1    SPM 75    Minutes 25    METs 1.2      Prescription Details   Frequency (times per week) 3    Duration Progress to 30 minutes of continuous aerobic without signs/symptoms of physical distress      Intensity   THRR 40-80% of Max Heartrate 60-119    Ratings of Perceived Exertion 11-13    Perceived Dyspnea 0-4      Progression   Progression Continue progressive overload as per policy without signs/symptoms or physical distress.      Resistance Training   Training Prescription Yes    Weight 2 lbs    Reps 10-15          Perform Capillary Blood Glucose checks as needed.  Exercise Prescription Changes:   Exercise Prescription Changes     Row Name 03/08/24 1453 04/02/24 1628 04/19/24 1637         Response to Exercise   Blood Pressure (Admit) 126/58 130/70 122/60     Blood Pressure (Exercise) 132/60 -- --     Blood Pressure (Exit) 106/78 118/58 110/56     Heart Rate (Admit) 71 bpm 80 bpm 84  bpm     Heart Rate (Exercise) 92 bpm 117  bpm 111 bpm     Heart Rate (Exit) 80 bpm 89 bpm 83 bpm     Rating of Perceived Exertion (Exercise) 13 11 11.5     Symptoms None None None     Comments Off to a good start with exercise. Reviewed MET's and goals Reviewed MET's and home ExRx     Duration Progress to 30 minutes of  aerobic without signs/symptoms of physical distress Progress to 30 minutes of  aerobic without signs/symptoms of physical distress Progress to 30 minutes of  aerobic without signs/symptoms of physical distress     Intensity THRR unchanged THRR unchanged THRR unchanged       Progression   Progression Continue to progress workloads to maintain intensity without signs/symptoms of physical distress. Continue to progress workloads to maintain intensity without signs/symptoms of physical distress. Continue to progress workloads to maintain intensity without signs/symptoms of physical distress.     Average METs 1.7 1.7 1.8       Resistance Training   Training Prescription Yes Yes Yes     Weight 2 lbs 2 lbs 2 lbs     Reps 10-15 10-15 10-15     Time 5 Minutes 5 Minutes 5 Minutes       Interval Training   Interval Training No No No       NuStep   Level 1 3 4      SPM 88 130 94     Minutes 26 30 30      METs 1.7 1.7 1.8  MET's taken from last session, they were not recorded today       Home Exercise Plan   Plans to continue exercise at -- -- Home (comment)     Frequency -- -- Add 2 additional days to program exercise sessions.     Initial Home Exercises Provided -- -- 02/18/24        Exercise Comments:   Exercise Comments     Row Name 03/08/24 1557 04/02/24 1634 04/19/24 1639       Exercise Comments Avalon tolerated low intensity exercise fair without symptoms. Intially the recumbent stepper was uncomfortable, chaffing the groing, but after adjusting the seat back, she found it much more comfortable, and she was able to complete 26 minutes. Oriented to the stretching routine. Reviewed MET's and goals. Pt  tolerated exercise well with an average MET level of 1.7. She is feeling good with exercise and is INC WL's, but not MET's. Talked today about increasing the depth of step for greater increases in MET's. She is now able to achieve 30 mins and is feeling better about her balance. She does have a goal to walk more and improve overall strength and flex. We may add in Octane or maybe track with a rollator, but want to focus first on increasing endurance and MET's on the nustep Reviewed MET's and home ExRx. Pt tolerated exercise well with an average MET level of 1.8. She has been feeling good with exercise, adjusted her seat and arms to get a better extention, unfortantly we did not get her MET's today, MET's from last session were 1.8. She is going to exercise on her own by using her recumbent bike and trying some chair fitness classes on youtube 1-2 days for 30 mins (broken up into smaller bouts as needed)        Exercise Goals and Review:   Exercise Goals     Row  Name 03/02/24 1327             Exercise Goals   Increase Physical Activity Yes       Intervention Provide advice, education, support and counseling about physical activity/exercise needs.;Develop an individualized exercise prescription for aerobic and resistive training based on initial evaluation findings, risk stratification, comorbidities and participant's personal goals.       Expected Outcomes Short Term: Attend rehab on a regular basis to increase amount of physical activity.;Long Term: Add in home exercise to make exercise part of routine and to increase amount of physical activity.;Long Term: Exercising regularly at least 3-5 days a week.       Increase Strength and Stamina Yes       Intervention Provide advice, education, support and counseling about physical activity/exercise needs.;Develop an individualized exercise prescription for aerobic and resistive training based on initial evaluation findings, risk stratification,  comorbidities and participant's personal goals.       Expected Outcomes Short Term: Increase workloads from initial exercise prescription for resistance, speed, and METs.;Short Term: Perform resistance training exercises routinely during rehab and add in resistance training at home;Long Term: Improve cardiorespiratory fitness, muscular endurance and strength as measured by increased METs and functional capacity ( )       Able to understand and use rate of perceived exertion (RPE) scale Yes       Intervention Provide education and explanation on how to use RPE scale       Expected Outcomes Short Term: Able to use RPE daily in rehab to express subjective intensity level;Long Term:  Able to use RPE to guide intensity level when exercising independently       Knowledge and understanding of Target Heart Rate Range (THRR) Yes       Intervention Provide education and explanation of THRR including how the numbers were predicted and where they are located for reference       Expected Outcomes Short Term: Able to state/look up THRR;Short Term: Able to use daily as guideline for intensity in rehab;Long Term: Able to use THRR to govern intensity when exercising independently       Understanding of Exercise Prescription Yes       Intervention Provide education, explanation, and written materials on patient's individual exercise prescription       Expected Outcomes Short Term: Able to explain program exercise prescription;Long Term: Able to explain home exercise prescription to exercise independently          Exercise Goals Re-Evaluation :  Exercise Goals Re-Evaluation     Row Name 03/08/24 1557 04/02/24 1630           Exercise Goal Re-Evaluation   Exercise Goals Review Increase Physical Activity;Increase Strength and Stamina;Able to understand and use rate of perceived exertion (RPE) scale Increase Physical Activity;Increase Strength and Stamina;Able to understand and use rate of perceived exertion  (RPE) scale;Understanding of Exercise Prescription;Knowledge and understanding of Target Heart Rate Range (THRR)      Comments Charlene Tapia is able to understand and use RPE scale appropriately. Reviewed MET's and goals. Pt tolerated exercise well with an average MET level of 1.7. She is feeling good with exercise and is INC WL's, but not MET's. Talked today about increasing the depth of step for greater increases in MET's. She is now able to achieve 30 mins and is feeling better about her balance. She does have a goal to walk more and improve overall strength and flex. We may add in Octane or maybe track  with a rollator, but want to focus first on increasing endurance and MET's on the nustep      Expected Outcomes Progress workloads as tolerated to help increase strength, stamina, and flexibility. Progress workloads as tolerated to help increase strength, stamina, and flexibility.         Discharge Exercise Prescription (Final Exercise Prescription Changes):  Exercise Prescription Changes - 04/19/24 1637       Response to Exercise   Blood Pressure (Admit) 122/60    Blood Pressure (Exit) 110/56    Heart Rate (Admit) 84 bpm    Heart Rate (Exercise) 111 bpm    Heart Rate (Exit) 83 bpm    Rating of Perceived Exertion (Exercise) 11.5    Symptoms None    Comments Reviewed MET's and home ExRx    Duration Progress to 30 minutes of  aerobic without signs/symptoms of physical distress    Intensity THRR unchanged      Progression   Progression Continue to progress workloads to maintain intensity without signs/symptoms of physical distress.    Average METs 1.8      Resistance Training   Training Prescription Yes    Weight 2 lbs    Reps 10-15    Time 5 Minutes      Interval Training   Interval Training No      NuStep   Level 4    SPM 94    Minutes 30    METs 1.8   MET's taken from last session, they were not recorded today     Home Exercise Plan   Plans to continue exercise at Home (comment)     Frequency Add 2 additional days to program exercise sessions.    Initial Home Exercises Provided 02/18/24          Nutrition:  Target Goals: Understanding of nutrition guidelines, daily intake of sodium 1500mg , cholesterol 200mg , calories 30% from fat and 7% or less from saturated fats, daily to have 5 or more servings of fruits and vegetables.  Biometrics:  Pre Biometrics - 03/02/24 1400       Pre Biometrics   Waist Circumference 56.5 inches    Hip Circumference 54.5 inches    Waist to Hip Ratio 1.04 %    Triceps Skinfold 42 mm    % Body Fat 62 %    Grip Strength 19 kg    Flexibility 9 in    Single Leg Stand --   No perfromed, pt refused: afraid of falling          Nutrition Therapy Plan and Nutrition Goals:   Nutrition Assessments:  MEDIFICTS Score Key: >=70 Need to make dietary changes  40-70 Heart Healthy Diet <= 40 Therapeutic Level Cholesterol Diet   Flowsheet Row INTENSIVE CARDIAC REHAB from 03/08/2024 in [facility] for Heart, Vascular, & Lung Health  Picture Your Plate Total Score on Admission 61   Picture Your Plate Scores: <59 Unhealthy dietary pattern with much room for improvement. 41-50 Dietary pattern unlikely to meet recommendations for good health and room for improvement. 51-60 More healthful dietary pattern, with some room for improvement.  >60 Healthy dietary pattern, although there may be some specific behaviors that could be improved.    Nutrition Goals Re-Evaluation:   Nutrition Goals Re-Evaluation:   Nutrition Goals Discharge (Final Nutrition Goals Re-Evaluation):   Psychosocial: Target Goals: Acknowledge presence or absence of significant depression and/or stress, maximize coping skills, provide positive support system. Participant is able to verbalize types  and ability to use techniques and skills needed for reducing stress and depression.  Initial Review & Psychosocial Screening:  Initial Psych  Review & Screening - 03/02/24 1403       Initial Review   Current issues with None Identified      Family Dynamics   Good Support System? Yes   Charlene Tapia has her husband for support     Barriers   Psychosocial barriers to participate in program There are no identifiable barriers or psychosocial needs.      Screening Interventions   Interventions Encouraged to exercise          Quality of Life Scores:  Quality of Life - 03/02/24 1407       Quality of Life   Select Quality of Life      Quality of Life Scores   Health/Function Pre 20.7 %    Socioeconomic Pre 30 %    Psych/Spiritual Pre 26.64 %    Family Pre 28.5 %    GLOBAL Pre 24.67 %         Scores of 19 and below usually indicate a poorer quality of life in these areas.  A difference of  2-3 points is a clinically meaningful difference.  A difference of 2-3 points in the total score of the Quality of Life Index has been associated with significant improvement in overall quality of life, self-image, physical symptoms, and general health in studies assessing change in quality of life.  PHQ-9: Review Flowsheet  More data exists      03/05/2024 03/02/2024 02/25/2023 11/01/2022 10/18/2022  Depression screen PHQ 2/9  Decreased Interest 0 0 0 0 0  Down, Depressed, Hopeless 0 0 0 0 0  PHQ - 2 Score 0 0 0 0 0  Altered sleeping 2 0 0 - -  Tired, decreased energy 0 0 0 - -  Change in appetite 0 3 0 - -  Feeling bad or failure about yourself  0 0 0 - -  Trouble concentrating 0 0 0 - -  Moving slowly or fidgety/restless 0 0 0 - -  Suicidal thoughts 0 0 0 - -  PHQ-9 Score 2  3  0  - -  Difficult doing work/chores Not difficult at all Not difficult at all Not difficult at all - -    Details       Data saved with a previous flowsheet row definition        Interpretation of Total Score  Total Score Depression Severity:  1-4 = Minimal depression, 5-9 = Mild depression, 10-14 = Moderate depression, 15-19 = Moderately severe  depression, 20-27 = Severe depression   Psychosocial Evaluation and Intervention:   Psychosocial Re-Evaluation:  Psychosocial Re-Evaluation     Row Name 03/08/24 1651 03/23/24 1209 04/20/24 1727         Psychosocial Re-Evaluation   Current issues with None Identified None Identified None Identified     Interventions Encouraged to attend Cardiac Rehabilitation for the exercise Encouraged to attend Cardiac Rehabilitation for the exercise Encouraged to attend Cardiac Rehabilitation for the exercise     Continue Psychosocial Services  No Follow up required No Follow up required No Follow up required        Psychosocial Discharge (Final Psychosocial Re-Evaluation):  Psychosocial Re-Evaluation - 04/20/24 1727       Psychosocial Re-Evaluation   Current issues with None Identified    Interventions Encouraged to attend Cardiac Rehabilitation for the exercise    Continue Psychosocial Services  No Follow up required          Vocational Rehabilitation: Provide vocational rehab assistance to qualifying candidates.   Vocational Rehab Evaluation & Intervention:  Vocational Rehab - 03/02/24 1403       Initial Vocational Rehab Evaluation & Intervention   Assessment shows need for Vocational Rehabilitation No   Charlene Tapia is recently retired and does not need vocational rehab at this time         Education: Education Goals: Education classes will be provided on a weekly basis, covering required topics. Participant will state understanding/return demonstration of topics presented.    Education     Row Name 03/08/24 1400     Education   Cardiac Education Topics Pritikin   Writer Psychosocial   Psychosocial Healthy Minds, Bodies, Hearts   Instruction Review Code 1- Verbalizes Understanding   Class Start Time 1400   Class Stop Time 1434   Class Time Calculation (min) 34 min    Row Name 03/10/24 1500     Education    Cardiac Education Topics Pritikin   Secondary School Teacher School   Educator Nurse;Respiratory Therapist   Weekly Topic Adding Flavor - Sodium-Free   Instruction Review Code 1- Verbalizes Understanding   Class Start Time 1357   Class Stop Time 1432   Class Time Calculation (min) 35 min    Row Name 03/15/24 1400     Education   Cardiac Education Topics Pritikin   Geographical Information Systems Officer Exercise   Exercise Workshop Location Manager and Fall Prevention   Instruction Review Code 1- Verbalizes Understanding   Class Start Time 1408   Class Stop Time 1500   Class Time Calculation (min) 52 min    Row Name 03/17/24 1500     Education   Cardiac Education Topics Pritikin   Customer Service Manager   Weekly Topic Fast and Healthy Breakfasts   Instruction Review Code 1- Verbalizes Understanding   Class Start Time 1400   Class Stop Time 1442   Class Time Calculation (min) 42 min    Row Name 03/19/24 1400     Education   Cardiac Education Topics Pritikin   Licensed Conveyancer Nutrition   Nutrition Other  Label Reading   Instruction Review Code 1- Verbalizes Understanding   Class Start Time 1358   Class Stop Time 1456   Class Time Calculation (min) 58 min    Row Name 03/22/24 1500     Education   Cardiac Education Topics Pritikin   Hospital Doctor Education   General Education Metabolic Syndrome and Belly Fat   Instruction Review Code 1- Verbalizes Understanding   Class Start Time 1407   Class Stop Time 1445   Class Time Calculation (min) 38 min    Row Name 03/24/24 1400     Education   Cardiac Education Topics Pritikin   Customer Service Manager   Weekly Topic Personalizing Your Pritikin Plate   Instruction  Review Code 1- Verbalizes Understanding   Class Start Time 1358   Class Stop  Time 1436   Class Time Calculation (min) 38 min    Row Name 03/26/24 1400     Education   Cardiac Education Topics Pritikin   Select Core Videos     Core Videos   Educator Dietitian   Select Nutrition   Nutrition Overview of the Pritikin Eating Plan   Instruction Review Code 1- Verbalizes Understanding   Class Start Time 1400   Class Stop Time 1444   Class Time Calculation (min) 44 min    Row Name 03/29/24 1400     Education   Cardiac Education Topics Pritikin   Select Workshops     Workshops   Educator Exercise Physiologist   Select Psychosocial   Psychosocial Workshop Recognizing and Reducing Stress   Instruction Review Code 1- Verbalizes Understanding   Class Start Time 1359   Class Stop Time 1445   Class Time Calculation (min) 46 min    Row Name 03/31/24 1300     Education   Cardiac Education Topics Pritikin   Customer Service Manager   Weekly Topic [employer] Desserts   Instruction Review Code 1- Verbalizes Understanding   Class Start Time 1400   Class Stop Time 1438   Class Time Calculation (min) 38 min    Row Name 04/02/24 1400     Education   Cardiac Education Topics Pritikin   Licensed Conveyancer Nutrition   Nutrition Calorie Density   Instruction Review Code 1- Verbalizes Understanding   Class Start Time 1400   Class Stop Time 1435   Class Time Calculation (min) 35 min    Row Name 04/05/24 1400     Education   Cardiac Education Topics Pritikin   Psychologist, Forensic Exercise Education   Exercise Education Move It!   Instruction Review Code 1- Verbalizes Understanding   Class Start Time 1412   Class Stop Time 1447   Class Time Calculation (min) 35 min    Row Name 04/07/24 1400     Education   Cardiac Education Topics  Pritikin   Customer Service Manager   Weekly Topic Efficiency Cooking - Meals in a Snap   Instruction Review Code 1- Verbalizes Understanding   Class Start Time 1400   Class Stop Time 1444   Class Time Calculation (min) 44 min    Row Name 04/09/24 1400     Education   Cardiac Education Topics Pritikin   [employer]     Workshops   Educator Exercise Physiologist   Select Exercise   Exercise Workshop Exercise Basics: Diplomatic Services Operational Officer   Instruction Review Code 1- Verbalizes Understanding   Class Start Time 1400   Class Stop Time 1452   Class Time Calculation (min) 52 min    Row Name 04/12/24 1400     Education   Cardiac Education Topics Pritikin   Glass Blower/designer Nutrition   Nutrition Workshop Targeting Your Nutrition Priorities   Instruction Review Code 1- Verbalizes Understanding   Class Start Time 1400   Class Stop Time 1445   Class Time Calculation (min) 45 min    Row Name 04/14/24 1400     Education   Cardiac Education  Topics Conservation Officer, Historic Buildings   Weekly Topic One-Pot Wonders   Instruction Review Code 1- Verbalizes Understanding   Class Start Time 1400   Class Stop Time 1445   Class Time Calculation (min) 45 min    Row Name 04/19/24 1400     Education   Cardiac Education Topics Pritikin   Select Workshops     Workshops   Educator Exercise Physiologist   Select Psychosocial   Psychosocial Workshop Focused Goals, Sustainable Changes   Instruction Review Code 1- Verbalizes Understanding   Class Start Time 1355   Class Stop Time 1438   Class Time Calculation (min) 43 min      Core Videos: Exercise    Move It!  Clinical staff conducted group or individual video education with verbal and written material and guidebook.  Patient learns the recommended Pritikin exercise program. Exercise with the goal  of living a long, healthy life. Some of the health benefits of exercise include controlled diabetes, healthier blood pressure levels, improved cholesterol levels, improved heart and lung capacity, improved sleep, and better body composition. Everyone should speak with their doctor before starting or changing an exercise routine.  Biomechanical Limitations Clinical staff conducted group or individual video education with verbal and written material and guidebook.  Patient learns how biomechanical limitations can impact exercise and how we can mitigate and possibly overcome limitations to have an impactful and balanced exercise routine.  Body Composition Clinical staff conducted group or individual video education with verbal and written material and guidebook.  Patient learns that body composition (ratio of muscle mass to fat mass) is a key component to assessing overall fitness, rather than body weight alone. Increased fat mass, especially visceral belly fat, can put us  at increased risk for metabolic syndrome, type 2 diabetes, heart disease, and even death. It is recommended to combine diet and exercise (cardiovascular and resistance training) to improve your body composition. Seek guidance from your physician and exercise physiologist before implementing an exercise routine.  Exercise Action Plan Clinical staff conducted group or individual video education with verbal and written material and guidebook.  Patient learns the recommended strategies to achieve and enjoy long-term exercise adherence, including variety, self-motivation, self-efficacy, and positive decision making. Benefits of exercise include fitness, good health, weight management, more energy, better sleep, less stress, and overall well-being.  Medical   Heart Disease Risk Reduction Clinical staff conducted group or individual video education with verbal and written material and guidebook.  Patient learns our heart is our most vital  organ as it circulates oxygen, nutrients, white blood cells, and hormones throughout the entire body, and carries waste away. Data supports a plant-based eating plan like the Pritikin Program for its effectiveness in slowing progression of and reversing heart disease. The video provides a number of recommendations to address heart disease.   Metabolic Syndrome and Belly Fat  Clinical staff conducted group or individual video education with verbal and written material and guidebook.  Patient learns what metabolic syndrome is, how it leads to heart disease, and how one can reverse it and keep it from coming back. You have metabolic syndrome if you have 3 of the following 5 criteria: abdominal obesity, high blood pressure, high triglycerides, low HDL cholesterol, and high blood sugar.  Hypertension and Heart Disease Clinical staff conducted group or individual video education with verbal and written material and guidebook.  Patient learns that high blood pressure, or hypertension,  is very common in the [place] . Hypertension is largely due to excessive salt intake, but other important risk factors include being overweight, physical inactivity, drinking too much alcohol, smoking, and not eating enough potassium from fruits and vegetables. High blood pressure is a leading risk factor for heart attack, stroke, congestive heart failure, dementia, kidney failure, and premature death. Long-term effects of excessive salt intake include stiffening of the arteries and thickening of heart muscle and organ damage. Recommendations include ways to reduce hypertension and the risk of heart disease.  Diseases of Our Time - Focusing on Diabetes Clinical staff conducted group or individual video education with verbal and written material and guidebook.  Patient learns why the best way to stop diseases of our time is prevention, through food and other lifestyle changes. Medicine (such as prescription pills and  surgeries) is often only a Band-Aid on the problem, not a long-term solution. Most common diseases of our time include obesity, type 2 diabetes, hypertension, heart disease, and cancer. The Pritikin Program is recommended and has been proven to help reduce, reverse, and/or prevent the damaging effects of metabolic syndrome.  Nutrition   Overview of the Pritikin Eating Plan  Clinical staff conducted group or individual video education with verbal and written material and guidebook.  Patient learns about the Pritikin Eating Plan for disease risk reduction. The Pritikin Eating Plan emphasizes a wide variety of unrefined, minimally-processed carbohydrates, like fruits, vegetables, whole grains, and legumes. Go, Caution, and Stop food choices are explained. Plant-based and lean animal proteins are emphasized. Rationale provided for low sodium intake for blood pressure control, low added sugars for blood sugar stabilization, and low added fats and oils for coronary artery disease risk reduction and weight management.  Calorie Density  Clinical staff conducted group or individual video education with verbal and written material and guidebook.  Patient learns about calorie density and how it impacts the Pritikin Eating Plan. Knowing the characteristics of the food you choose will help you decide whether those foods will lead to weight gain or weight loss, and whether you want to consume more or less of them. Weight loss is usually a side effect of the Pritikin Eating Plan because of its focus on low calorie-dense foods.  Label Reading  Clinical staff conducted group or individual video education with verbal and written material and guidebook.  Patient learns about the Pritikin recommended label reading guidelines and corresponding recommendations regarding calorie density, added sugars, sodium content, and whole grains.  Dining Out - Part 1  Clinical staff conducted group or individual video education with  verbal and written material and guidebook.  Patient learns that restaurant meals can be sabotaging because they can be so high in calories, fat, sodium, and/or sugar. Patient learns recommended strategies on how to positively address this and avoid unhealthy pitfalls.  Facts on Fats  Clinical staff conducted group or individual video education with verbal and written material and guidebook.  Patient learns that lifestyle modifications can be just as effective, if not more so, as many medications for lowering your risk of heart disease. A Pritikin lifestyle can help to reduce your risk of inflammation and atherosclerosis (cholesterol build-up, or plaque, in the artery walls). Lifestyle interventions such as dietary choices and physical activity address the cause of atherosclerosis. A review of the types of fats and their impact on blood cholesterol levels, along with dietary recommendations to reduce fat intake is also included.  Nutrition Action Plan  Clinical staff conducted group or individual  video education with verbal and written material and guidebook.  Patient learns how to incorporate Pritikin recommendations into their lifestyle. Recommendations include planning and keeping personal health goals in mind as an important part of their success.  Healthy Mind-Set    Healthy Minds, Bodies, Hearts  Clinical staff conducted group or individual video education with verbal and written material and guidebook.  Patient learns how to identify when they are stressed. Video will discuss the impact of that stress, as well as the many benefits of stress management. Patient will also be introduced to stress management techniques. The way we think, act, and feel has an impact on our hearts.  How Our Thoughts Can Heal Our Hearts  Clinical staff conducted group or individual video education with verbal and written material and guidebook.  Patient learns that negative thoughts can cause depression and anxiety.  This can result in negative lifestyle behavior and serious health problems. Cognitive behavioral therapy is an effective method to help control our thoughts in order to change and improve our emotional outlook.  Additional Videos:  Exercise    Improving Performance  Clinical staff conducted group or individual video education with verbal and written material and guidebook.  Patient learns to use a non-linear approach by alternating intensity levels and lengths of time spent exercising to help burn more calories and lose more body fat. Cardiovascular exercise helps improve heart health, metabolism, hormonal balance, blood sugar control, and recovery from fatigue. Resistance training improves strength, endurance, balance, coordination, reaction time, metabolism, and muscle mass. Flexibility exercise improves circulation, posture, and balance. Seek guidance from your physician and exercise physiologist before implementing an exercise routine and learn your capabilities and proper form for all exercise.  Introduction to Yoga  Clinical staff conducted group or individual video education with verbal and written material and guidebook.  Patient learns about yoga, a discipline of the coming together of mind, breath, and body. The benefits of yoga include improved flexibility, improved range of motion, better posture and core strength, increased lung function, weight loss, and positive self-image. Yoga's heart health benefits include lowered blood pressure, healthier heart rate, decreased cholesterol and triglyceride levels, improved immune function, and reduced stress. Seek guidance from your physician and exercise physiologist before implementing an exercise routine and learn your capabilities and proper form for all exercise.  Medical   Aging: Enhancing Your Quality of Life  Clinical staff conducted group or individual video education with verbal and written material and guidebook.  Patient learns key  strategies and recommendations to stay in good physical health and enhance quality of life, such as prevention strategies, having an advocate, securing a Health Care Proxy and Power of Attorney, and keeping a list of medications and system for tracking them. It also discusses how to avoid risk for bone loss.  Biology of Weight Control  Clinical staff conducted group or individual video education with verbal and written material and guidebook.  Patient learns that weight gain occurs because we consume more calories than we burn (eating more, moving less). Even if your body weight is normal, you may have higher ratios of fat compared to muscle mass. Too much body fat puts you at increased risk for cardiovascular disease, heart attack, stroke, type 2 diabetes, and obesity-related cancers. In addition to exercise, following the Pritikin Eating Plan can help reduce your risk.  Decoding Lab Results  Clinical staff conducted group or individual video education with verbal and written material and guidebook.  Patient learns that lab test reflects  one measurement whose values change over time and are influenced by many factors, including medication, stress, sleep, exercise, food, hydration, pre-existing medical conditions, and more. It is recommended to use the knowledge from this video to become more involved with your lab results and evaluate your numbers to speak with your doctor.   Diseases of Our Time - Overview  Clinical staff conducted group or individual video education with verbal and written material and guidebook.  Patient learns that according to the CDC, 50% to 70% of chronic diseases (such as obesity, type 2 diabetes, elevated lipids, hypertension, and heart disease) are avoidable through lifestyle improvements including healthier food choices, listening to satiety cues, and increased physical activity.  Sleep Disorders Clinical staff conducted group or individual video education with verbal and  written material and guidebook.  Patient learns how good quality and duration of sleep are important to overall health and well-being. Patient also learns about sleep disorders and how they impact health along with recommendations to address them, including discussing with a physician.  Nutrition  Dining Out - Part 2 Clinical staff conducted group or individual video education with verbal and written material and guidebook.  Patient learns how to plan ahead and communicate in order to maximize their dining experience in a healthy and nutritious manner. Included are recommended food choices based on the type of restaurant the patient is visiting.   Fueling a Banker conducted group or individual video education with verbal and written material and guidebook.  There is a strong connection between our food choices and our health. Diseases like obesity and type 2 diabetes are very prevalent and are in large-part due to lifestyle choices. The Pritikin Eating Plan provides plenty of food and hunger-curbing satisfaction. It is easy to follow, affordable, and helps reduce health risks.  Menu Workshop  Clinical staff conducted group or individual video education with verbal and written material and guidebook.  Patient learns that restaurant meals can sabotage health goals because they are often packed with calories, fat, sodium, and sugar. Recommendations include strategies to plan ahead and to communicate with the manager, chef, or server to help order a healthier meal.  Planning Your Eating Strategy  Clinical staff conducted group or individual video education with verbal and written material and guidebook.  Patient learns about the Pritikin Eating Plan and its benefit of reducing the risk of disease. The Pritikin Eating Plan does not focus on calories. Instead, it emphasizes high-quality, nutrient-rich foods. By knowing the characteristics of the foods, we choose, we can determine  their calorie density and make informed decisions.  Targeting Your Nutrition Priorities  Clinical staff conducted group or individual video education with verbal and written material and guidebook.  Patient learns that lifestyle habits have a tremendous impact on disease risk and progression. This video provides eating and physical activity recommendations based on your personal health goals, such as reducing LDL cholesterol, losing weight, preventing or controlling type 2 diabetes, and reducing high blood pressure.  Vitamins and Minerals  Clinical staff conducted group or individual video education with verbal and written material and guidebook.  Patient learns different ways to obtain key vitamins and minerals, including through a recommended healthy diet. It is important to discuss all supplements you take with your doctor.   Healthy Mind-Set    Smoking Cessation  Clinical staff conducted group or individual video education with verbal and written material and guidebook.  Patient learns that cigarette smoking and tobacco addiction pose a serious  health risk which affects millions of people. Stopping smoking will significantly reduce the risk of heart disease, lung disease, and many forms of cancer. Recommended strategies for quitting are covered, including working with your doctor to develop a successful plan.  Culinary   Becoming a Set Designer conducted group or individual video education with verbal and written material and guidebook.  Patient learns that cooking at home can be healthy, cost-effective, quick, and puts them in control. Keys to cooking healthy recipes will include looking at your recipe, assessing your equipment needs, planning ahead, making it simple, choosing cost-effective seasonal ingredients, and limiting the use of added fats, salts, and sugars.  Cooking - Breakfast and Snacks  Clinical staff conducted group or individual video education with verbal  and written material and guidebook.  Patient learns how important breakfast is to satiety and nutrition through the entire day. Recommendations include key foods to eat during breakfast to help stabilize blood sugar levels and to prevent overeating at meals later in the day. Planning ahead is also a key component.  Cooking - Educational Psychologist conducted group or individual video education with verbal and written material and guidebook.  Patient learns eating strategies to improve overall health, including an approach to cook more at home. Recommendations include thinking of animal protein as a side on your plate rather than center stage and focusing instead on lower calorie dense options like vegetables, fruits, whole grains, and plant-based proteins, such as beans. Making sauces in large quantities to freeze for later and leaving the skin on your vegetables are also recommended to maximize your experience.  Cooking - Healthy Salads and Dressing Clinical staff conducted group or individual video education with verbal and written material and guidebook.  Patient learns that vegetables, fruits, whole grains, and legumes are the foundations of the Pritikin Eating Plan. Recommendations include how to incorporate each of these in flavorful and healthy salads, and how to create homemade salad dressings. Proper handling of ingredients is also covered. Cooking - Soups and [employer] - Soups and Desserts Clinical staff conducted group or individual video education with verbal and written material and guidebook.  Patient learns that Pritikin soups and desserts make for easy, nutritious, and delicious snacks and meal components that are low in sodium, fat, sugar, and calorie density, while high in vitamins, minerals, and filling fiber. Recommendations include simple and healthy ideas for soups and desserts.   Overview     The Pritikin Solution Program Overview Clinical staff conducted  group or individual video education with verbal and written material and guidebook.  Patient learns that the results of the Pritikin Program have been documented in more than 100 articles published in peer-reviewed journals, and the benefits include reducing risk factors for (and, in some cases, even reversing) high cholesterol, high blood pressure, type 2 diabetes, obesity, and more! An overview of the three key pillars of the Pritikin Program will be covered: eating well, doing regular exercise, and having a healthy mind-set.  WORKSHOPS  Exercise: Exercise Basics: Building Your Action Plan Clinical staff led group instruction and group discussion with PowerPoint presentation and patient guidebook. To enhance the learning environment the use of posters, models and videos may be added. At the conclusion of this workshop, patients will comprehend the difference between physical activity and exercise, as well as the benefits of incorporating both, into their routine. Patients will understand the FITT (Frequency, Intensity, Time, and Type) principle and how to use  it to build an exercise action plan. In addition, safety concerns and other considerations for exercise and cardiac rehab will be addressed by the presenter. The purpose of this lesson is to promote a comprehensive and effective weekly exercise routine in order to improve patients' overall level of fitness.   Managing Heart Disease: Your Path to a Healthier Heart Clinical staff led group instruction and group discussion with PowerPoint presentation and patient guidebook. To enhance the learning environment the use of posters, models and videos may be added.At the conclusion of this workshop, patients will understand the anatomy and physiology of the heart. Additionally, they will understand how Pritikin's three pillars impact the risk factors, the progression, and the management of heart disease.  The purpose of this lesson is to provide a  high-level overview of the heart, heart disease, and how the Pritikin lifestyle positively impacts risk factors.  Exercise Biomechanics Clinical staff led group instruction and group discussion with PowerPoint presentation and patient guidebook. To enhance the learning environment the use of posters, models and videos may be added. Patients will learn how the structural parts of their bodies function and how these functions impact their daily activities, movement, and exercise. Patients will learn how to promote a neutral spine, learn how to manage pain, and identify ways to improve their physical movement in order to promote healthy living. The purpose of this lesson is to expose patients to common physical limitations that impact physical activity. Participants will learn practical ways to adapt and manage aches and pains, and to minimize their effect on regular exercise. Patients will learn how to maintain good posture while sitting, walking, and lifting.  Balance Training and Fall Prevention  Clinical staff led group instruction and group discussion with PowerPoint presentation and patient guidebook. To enhance the learning environment the use of posters, models and videos may be added. At the conclusion of this workshop, patients will understand the importance of their sensorimotor skills (vision, proprioception, and the vestibular system) in maintaining their ability to balance as they age. Patients will apply a variety of balancing exercises that are appropriate for their current level of function. Patients will understand the common causes for poor balance, possible solutions to these problems, and ways to modify their physical environment in order to minimize their fall risk. The purpose of this lesson is to teach patients about the importance of maintaining balance as they age and ways to minimize their risk of falling.  WORKSHOPS   Nutrition:  Fueling a Ship Broker  led group instruction and group discussion with PowerPoint presentation and patient guidebook. To enhance the learning environment the use of posters, models and videos may be added. Patients will review the foundational principles of the Pritikin Eating Plan and understand what constitutes a serving size in each of the food groups. Patients will also learn Pritikin-friendly foods that are better choices when away from home and review make-ahead meal and snack options. Calorie density will be reviewed and applied to three nutrition priorities: weight maintenance, weight loss, and weight gain. The purpose of this lesson is to reinforce (in a group setting) the key concepts around what patients are recommended to eat and how to apply these guidelines when away from home by planning and selecting Pritikin-friendly options. Patients will understand how calorie density may be adjusted for different weight management goals.  Mindful Eating  Clinical staff led group instruction and group discussion with PowerPoint presentation and patient guidebook. To enhance the learning environment the use  of posters, models and videos may be added. Patients will briefly review the concepts of the Pritikin Eating Plan and the importance of low-calorie dense foods. The concept of mindful eating will be introduced as well as the importance of paying attention to internal hunger signals. Triggers for non-hunger eating and techniques for dealing with triggers will be explored. The purpose of this lesson is to provide patients with the opportunity to review the basic principles of the Pritikin Eating Plan, discuss the value of eating mindfully and how to measure internal cues of hunger and fullness using the Hunger Scale. Patients will also discuss reasons for non-hunger eating and learn strategies to use for controlling emotional eating.  Targeting Your Nutrition Priorities Clinical staff led group instruction and group discussion  with PowerPoint presentation and patient guidebook. To enhance the learning environment the use of posters, models and videos may be added. Patients will learn how to determine their genetic susceptibility to disease by reviewing their family history. Patients will gain insight into the importance of diet as part of an overall healthy lifestyle in mitigating the impact of genetics and other environmental insults. The purpose of this lesson is to provide patients with the opportunity to assess their personal nutrition priorities by looking at their family history, their own health history and current risk factors. Patients will also be able to discuss ways of prioritizing and modifying the Pritikin Eating Plan for their highest risk areas  Menu  Clinical staff led group instruction and group discussion with PowerPoint presentation and patient guidebook. To enhance the learning environment the use of posters, models and videos may be added. Using menus brought in from [employer], or printed from [employer], patients will apply the Pritikin dining out guidelines that were presented in the [employer] video. Patients will also be able to practice these guidelines in a variety of provided scenarios. The purpose of this lesson is to provide patients with the opportunity to practice hands-on learning of the Pritikin Dining Out guidelines with actual menus and practice scenarios.  Label Reading Clinical staff led group instruction and group discussion with PowerPoint presentation and patient guidebook. To enhance the learning environment the use of posters, models and videos may be added. Patients will review and discuss the Pritikin label reading guidelines presented in [employer]'s Label Reading Educational series video. Using fool labels brought in from local grocery stores and markets, patients will apply the label reading guidelines and determine if the packaged food meet the Pritikin  guidelines. The purpose of this lesson is to provide patients with the opportunity to review, discuss, and practice hands-on learning of the Pritikin Label Reading guidelines with actual packaged food labels. Cooking School  [employer]'s [employer] are designed to teach patients ways to prepare quick, simple, and affordable recipes at home. The importance of nutrition's role in chronic disease risk reduction is reflected in its emphasis in the overall Pritikin program. By learning how to prepare essential core Pritikin Eating Plan recipes, patients will increase control over what they eat; be able to customize the flavor of foods without the use of added salt, sugar, or fat; and improve the quality of the food they consume. By learning a set of core recipes which are easily assembled, quickly prepared, and affordable, patients are more likely to prepare more healthy foods at home. These workshops focus on convenient breakfasts, simple entres, side dishes, and desserts which can be prepared with minimal effort and are consistent with nutrition recommendations  for cardiovascular risk reduction. Cooking [employer] are taught by a armed forces logistics/support/administrative officer (RD) who has been trained by the [employer]. The chef or RD has a clear understanding of the importance of minimizing - if not completely eliminating - added fat, sugar, and sodium in recipes. Throughout the series of Cooking School Workshop sessions, patients will learn about healthy ingredients and efficient methods of cooking to build confidence in their capability to prepare    Cooking School weekly topics:  Adding Flavor- Sodium-Free  Fast and Healthy Breakfasts  Powerhouse Plant-Based Proteins  Satisfying Salads and Dressings  Simple Sides and Sauces  International Cuisine-Spotlight on the [employer] Zones  Delicious Desserts  Savory Soups  [employer] - Meals in a Astronomer Appetizers and  Snacks  Comforting Weekend Breakfasts  One-Pot Wonders   Fast Evening Meals  Landscape Architect Your Pritikin Plate  WORKSHOPS   Healthy Mindset (Psychosocial):  Focused Goals, Sustainable Changes Clinical staff led group instruction and group discussion with PowerPoint presentation and patient guidebook. To enhance the learning environment the use of posters, models and videos may be added. Patients will be able to apply effective goal setting strategies to establish at least one personal goal, and then take consistent, meaningful action toward that goal. They will learn to identify common barriers to achieving personal goals and develop strategies to overcome them. Patients will also gain an understanding of how our mind-set can impact our ability to achieve goals and the importance of cultivating a positive and growth-oriented mind-set. The purpose of this lesson is to provide patients with a deeper understanding of how to set and achieve personal goals, as well as the tools and strategies needed to overcome common obstacles which may arise along the way.  From Head to Heart: The Power of a Healthy Outlook  Clinical staff led group instruction and group discussion with PowerPoint presentation and patient guidebook. To enhance the learning environment the use of posters, models and videos may be added. Patients will be able to recognize and describe the impact of emotions and mood on physical health. They will discover the importance of self-care and explore self-care practices which may work for them. Patients will also learn how to utilize the 4 C's to cultivate a healthier outlook and better manage stress and challenges. The purpose of this lesson is to demonstrate to patients how a healthy outlook is an essential part of maintaining good health, especially as they continue their cardiac rehab journey.  Healthy Sleep for a Healthy Heart Clinical staff led group instruction and  group discussion with PowerPoint presentation and patient guidebook. To enhance the learning environment the use of posters, models and videos may be added. At the conclusion of this workshop, patients will be able to demonstrate knowledge of the importance of sleep to overall health, well-being, and quality of life. They will understand the symptoms of, and treatments for, common sleep disorders. Patients will also be able to identify daytime and nighttime behaviors which impact sleep, and they will be able to apply these tools to help manage sleep-related challenges. The purpose of this lesson is to provide patients with a general overview of sleep and outline the importance of quality sleep. Patients will learn about a few of the most common sleep disorders. Patients will also be introduced to the concept of "sleep hygiene," and discover ways to self-manage certain sleeping problems through simple daily behavior changes. Finally, the workshop will motivate patients by clarifying  the links between quality sleep and their goals of heart-healthy living.   Recognizing and Reducing Stress Clinical staff led group instruction and group discussion with PowerPoint presentation and patient guidebook. To enhance the learning environment the use of posters, models and videos may be added. At the conclusion of this workshop, patients will be able to understand the types of stress reactions, differentiate between acute and chronic stress, and recognize the impact that chronic stress has on their health. They will also be able to apply different coping mechanisms, such as reframing negative self-talk. Patients will have the opportunity to practice a variety of stress management techniques, such as deep abdominal breathing, progressive muscle relaxation, and/or guided imagery.  The purpose of this lesson is to educate patients on the role of stress in their lives and to provide healthy techniques for coping with  it.  Learning Barriers/Preferences:  Learning Barriers/Preferences - 03/02/24 1545       Learning Barriers/Preferences   Learning Barriers Sight    Learning Preferences Individual Instruction;Group Instruction;Computer/Internet          Education Topics:  Knowledge Questionnaire Score:  Knowledge Questionnaire Score - 03/02/24 1410       Knowledge Questionnaire Score   Pre Score 23/24          Core Components/Risk Factors/Patient Goals at Admission:  Personal Goals and Risk Factors at Admission - 03/02/24 1546       Core Components/Risk Factors/Patient Goals on Admission    Weight Management Yes;Obesity;Weight Loss    Intervention Weight Management: Develop a combined nutrition and exercise program designed to reach desired caloric intake, while maintaining appropriate intake of nutrient and fiber, sodium and fats, and appropriate energy expenditure required for the weight goal.;Weight Management: Provide education and appropriate resources to help participant work on and attain dietary goals.;Weight Management/Obesity: Establish reasonable short term and long term weight goals.;Obesity: Provide education and appropriate resources to help participant work on and attain dietary goals.    Admit Weight 268 lb 15.4 oz (122 kg)    Expected Outcomes Short Term: Continue to assess and modify interventions until short term weight is achieved;Long Term: Adherence to nutrition and physical activity/exercise program aimed toward attainment of established weight goal;Weight Loss: Understanding of general recommendations for a balanced deficit meal plan, which promotes 1-2 lb weight loss per week and includes a negative energy balance of [phone] kcal/d;Understanding recommendations for meals to include 15-35% energy as protein, 25-35% energy from fat, 35-60% energy from carbohydrates, less than 200mg  of dietary cholesterol, 20-35 gm of total fiber daily;Understanding of distribution of calorie  intake throughout the day with the consumption of 4-5 meals/snacks    Lipids Yes    Intervention Provide education and support for participant on nutrition & aerobic/resistive exercise along with prescribed medications to achieve LDL 70mg , HDL >40mg .    Expected Outcomes Short Term: Participant states understanding of desired cholesterol values and is compliant with medications prescribed. Participant is following exercise prescription and nutrition guidelines.;Long Term: Cholesterol controlled with medications as prescribed, with individualized exercise RX and with personalized nutrition plan. Value goals: LDL < 70mg , HDL > 40 mg.          Core Components/Risk Factors/Patient Goals Review:   Goals and Risk Factor Review     Row Name 03/08/24 1655 03/23/24 1209 04/20/24 1727         Core Components/Risk Factors/Patient Goals Review   Personal Goals Review Weight Management/Obesity;Lipids Weight Management/Obesity;Lipids Weight Management/Obesity;Lipids     Review Charlene Tapia started  cardiac rehab on 03/08/24. Charlene Tapia did fair with exercise for her fitness level. Charlene Tapia's vital were stable. Charlene Tapia is deconditoned. Charlene Tapia started cardiac rehab on 03/08/24. Charlene Tapia is off to a good start with exercise for her fitness level. Charlene Tapia's vital have been stable. Charlene Tapia is doing well  with exercise at cardiac rehab for her  for her fitness level. Charlene Tapia's vital have been stable. Charlene Tapia's weight is unchanged.     Expected Outcomes Charlene Tapia will continue to participate in cardiac rehab for exercise, nutrition and lifestyle modifications. Charlene Tapia will continue to participate in cardiac rehab for exercise, nutrition and lifestyle modifications. Charlene Tapia will continue to participate in cardiac rehab for exercise, nutrition and lifestyle modifications.        Core Components/Risk Factors/Patient Goals at Discharge (Final Review):   Goals and Risk Factor Review - 04/20/24 1727       Core Components/Risk  Factors/Patient Goals Review   Personal Goals Review Weight Management/Obesity;Lipids    Review Charlene Tapia is doing well  with exercise at cardiac rehab for her  for her fitness level. Charlene Tapia's vital have been stable. Charlene Tapia's weight is unchanged.    Expected Outcomes Charlene Tapia will continue to participate in cardiac rehab for exercise, nutrition and lifestyle modifications.          ITP Comments:  ITP Comments     Row Name 03/02/24 1327 03/08/24 1650 03/23/24 1208 04/20/24 1726     ITP Comments Wilbert Bihari, MD: Medical Director. Introduction to the [facility] Education Program/Intensive Cardiac Rehab. Initial orientation packet reviewed with the patient. 30 Day ITP Review. Charlene Tapia started cardiac rehab on 03/08/24. Charlene Tapia did fair for her fitness level. Charlene Tapia says she is motivated to participate in the program. 30 Day ITP Review. Charlene Tapia started cardiac rehab on 03/08/24. Charlene Tapia is off to a good start to exercise for her fitness level. 30 Day ITP Review. Charlene Tapia has good attendance and participation with exercise at cardiac rehab       Comments: See ITP comments.Charlene Elpidio Quan RN BSN

## 2024-04-21 ENCOUNTER — Encounter (HOSPITAL_COMMUNITY)
Admission: RE | Admit: 2024-04-21 | Discharge: 2024-04-21 | Disposition: A | Source: Ambulatory Visit | Attending: Internal Medicine

## 2024-04-21 DIAGNOSIS — Z952 Presence of prosthetic heart valve: Secondary | ICD-10-CM | POA: Diagnosis not present

## 2024-04-23 ENCOUNTER — Encounter (HOSPITAL_COMMUNITY)
Admission: RE | Admit: 2024-04-23 | Discharge: 2024-04-23 | Disposition: A | Source: Ambulatory Visit | Attending: Internal Medicine

## 2024-04-23 DIAGNOSIS — Z952 Presence of prosthetic heart valve: Secondary | ICD-10-CM | POA: Diagnosis not present

## 2024-04-26 ENCOUNTER — Encounter (HOSPITAL_COMMUNITY)

## 2024-04-28 ENCOUNTER — Encounter (HOSPITAL_COMMUNITY)
Admission: RE | Admit: 2024-04-28 | Discharge: 2024-04-28 | Disposition: A | Discharge status: Home / Self Care | Source: Ambulatory Visit | Attending: Internal Medicine | Admitting: Internal Medicine

## 2024-04-28 DIAGNOSIS — Z952 Presence of prosthetic heart valve: Secondary | ICD-10-CM | POA: Diagnosis not present

## 2024-04-30 ENCOUNTER — Encounter: Payer: Self-pay | Admitting: Nurse Practitioner

## 2024-04-30 ENCOUNTER — Encounter (HOSPITAL_COMMUNITY)
Admission: RE | Admit: 2024-04-30 | Discharge: 2024-04-30 | Disposition: A | Source: Ambulatory Visit | Attending: Internal Medicine

## 2024-04-30 ENCOUNTER — Ambulatory Visit: Admitting: Nurse Practitioner

## 2024-04-30 VITALS — BP 130/64 | HR 58 | Temp 97.7°F | Ht 61.0 in | Wt 262.5 lb

## 2024-04-30 DIAGNOSIS — R2233 Localized swelling, mass and lump, upper limb, bilateral: Secondary | ICD-10-CM | POA: Insufficient documentation

## 2024-04-30 DIAGNOSIS — Z23 Encounter for immunization: Secondary | ICD-10-CM

## 2024-04-30 DIAGNOSIS — M1712 Unilateral primary osteoarthritis, left knee: Secondary | ICD-10-CM

## 2024-04-30 DIAGNOSIS — Z952 Presence of prosthetic heart valve: Secondary | ICD-10-CM

## 2024-04-30 DIAGNOSIS — M1711 Unilateral primary osteoarthritis, right knee: Secondary | ICD-10-CM

## 2024-04-30 DIAGNOSIS — L989 Disorder of the skin and subcutaneous tissue, unspecified: Secondary | ICD-10-CM | POA: Diagnosis not present

## 2024-04-30 DIAGNOSIS — R251 Tremor, unspecified: Secondary | ICD-10-CM | POA: Insufficient documentation

## 2024-04-30 DIAGNOSIS — Z1211 Encounter for screening for malignant neoplasm of colon: Secondary | ICD-10-CM | POA: Insufficient documentation

## 2024-04-30 NOTE — Assessment & Plan Note (Signed)
 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 - Referred to dermatology for evaluation of skin lesion.

## 2024-04-30 NOTE — Assessment & Plan Note (Signed)
 Tremor Mild tremor, likely essential tremor. - Referred to neurology for evaluation and potential nerve study.

## 2024-04-30 NOTE — Assessment & Plan Note (Signed)
 Hip pain with impaired mobility Chronic hip pain with impaired mobility, worsening with walking. Uses a cane for long distances. - Provided handicap placard for mobility assistance. - Continue using cane as needed. - Consider life alert system if falls increase.

## 2024-04-30 NOTE — Assessment & Plan Note (Signed)
 Bilateral upper extremity masses Masses increasing in size and number. Differential includes cystic versus solid mass. - Ordered ultrasound of bilateral upper extremities. - Referred to general surgeon for evaluation and potential removal.

## 2024-04-30 NOTE — Assessment & Plan Note (Signed)
 Colorectal cancer screening Average risk for colon cancer. Prefers Cologuard due to less invasiveness. - Ordered Cologuard test. - If Cologuard is positive, refer for diagnostic colonoscopy.

## 2024-04-30 NOTE — Progress Notes (Signed)
 Established Patient Office Visit  Subjective   Patient ID: Charlene Tapia, female    DOB: Jan 14, 1951  Age: 73 y.o. MRN: [id number]  Chief Complaint  Patient presents with   skin changes    Discussed the use of AI scribe software for clinical note transcription with the patient, who gave verbal consent to proceed.  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 - Rehabilitation progressing well - Blood pressure stable with monitoring in rehabilitation and at home - History of congestive heart failure  Musculoskeletal symptoms and mobility - Worsening hip pain, has chronic arthritis - History of fall prior to RSV hospitalization - Uses cane for long distances - Balance issues present - Uses recumbent stepper and bike to improve leg strength  Neurological symptoms - Tremor present, not yet evaluated  Cutaneous findings - Multiple enlarging lumps on arms and wrist, increasing in size and number - Lumps are nonpainful but sometimes pruritic - Mole on back with ashy appearance, noted by husband to look different  Preventive health and screening - Up to date on COVID, RSV, shingles, and influenza vaccines - Tapia colonoscopy performed; completed Cologuard in 2020 with Tapia abnormal findings, due for repeat testing - Tapia known polyps, blood in stool, or family history of colon cancer - Normal mammogram in November - Bone density (DEXA) scan not yet scheduled, inquiring about process  Lipid management - Rosuvastatin  5mg /day  - Last LDL 85       Review of Systems  Constitutional:  Negative for diaphoresis and fever.  Respiratory:  Negative for cough, shortness of breath and wheezing.   Cardiovascular:  Negative for chest pain and palpitations.  Gastrointestinal:  Negative for  abdominal pain and blood in stool.  Genitourinary:  Negative for dysuria and hematuria.  Skin:  Positive for itching and rash.  Psychiatric/Behavioral:  Negative for depression and suicidal ideas. The patient is not nervous/anxious and does not have insomnia.       Objective:     BP 130/64   Pulse (!) 58   Temp 97.7 F (36.5 C) (Temporal)   Ht 5' 1 (1.549 m)   Wt 262 lb 8 oz (119.1 kg)   BMI 49.60 kg/m    Physical Exam Vitals reviewed.  Constitutional:      Appearance: Normal appearance.  HENT:     Head: Normocephalic and atraumatic.     Right Ear: Tympanic membrane, ear canal and external ear normal.     Left Ear: Tympanic membrane, ear canal and external ear normal.  Eyes:     General:        Right eye: Tapia discharge.        Left eye: Tapia discharge.     Extraocular Movements: Extraocular movements intact.     Conjunctiva/sclera: Conjunctivae normal.     Pupils: Pupils are equal, round, and reactive to light.  Neck:     Vascular: Tapia carotid bruit.  Cardiovascular:     Rate and Rhythm: Normal rate and regular rhythm.     Pulses: Normal pulses.     Heart sounds: Normal heart sounds. Tapia murmur heard. Pulmonary:     Effort: Pulmonary effort is normal.     Breath sounds: Normal breath sounds.  Chest:     Comments: Breast exam deferred per patient preference Abdominal:  General: Abdomen is flat. Bowel sounds are normal. There is Tapia distension.     Palpations: Abdomen is soft. There is Tapia mass.     Tenderness: There is Tapia abdominal tenderness.  Musculoskeletal:        General: Tapia tenderness.     Cervical back: Neck supple. Tapia muscular tenderness.     Right lower leg: Tapia edema.     Left lower leg: Tapia edema.  Lymphadenopathy:     Cervical: Tapia cervical adenopathy.     Upper Body:     Right upper body: Tapia supraclavicular adenopathy.     Left upper body: Tapia supraclavicular adenopathy.  Skin:    General: Skin is warm and dry.      Neurological:     General: Tapia  focal deficit present.     Mental Status: She is alert and oriented to person, place, and time.     Motor: Tremor present. Tapia weakness.     Gait: Gait normal.  Psychiatric:        Mood and Affect: Mood normal.        Behavior: Behavior normal.        Judgment: Judgment normal.      Tapia results found for any visits on 04/30/24.    The 10-year ASCVD risk score (Arnett DK, et al., 2019) is: 13.7%    Assessment & Plan:   Problem List Items Addressed This Visit       Musculoskeletal and Integument   Arthritis of knee, left (Chronic)   Arthritis of right knee   Skin lesion   Relevant Orders   Ambulatory referral to Dermatology     Other   Colon cancer screening   Relevant Orders   Cologuard   Mass of both upper extremities - Primary   Relevant Orders   Ambulatory referral to General Surgery   US  SOFT TISSUE RT UPPER EXTREMITY LTD (NON-VASCULAR)   US  LT UPPER EXTREM LTD SOFT TISSUE NON VASCULAR   Tremor   Relevant Orders   Ambulatory referral to Neurology   Assessment and Plan Assessment & Plan Colorectal cancer screening Average risk for colon cancer. Prefers Cologuard due to less invasiveness. - Ordered Cologuard test. - If Cologuard is positive, refer for diagnostic colonoscopy.  Bilateral upper extremity masses Masses increasing in size and number. Differential includes cystic versus solid mass. - Ordered ultrasound of bilateral upper extremities. - Referred to general surgeon for evaluation and potential removal.  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 - Referred to dermatology for evaluation of skin lesion.  Tremor Mild tremor, likely essential tremor. - Referred to neurology for evaluation and potential nerve study.  Hip pain with impaired mobility Chronic hip pain with impaired mobility, worsening with walking. Uses a cane for long distances. - Provided handicap placard for mobility assistance. - Continue using cane as  needed. - Consider life alert system if falls increase.    Return in about 6 months (around 10/29/2024) for F/U with Tejah Brekke.    Charlene FORBES Pereyra, NP

## 2024-04-30 NOTE — Addendum Note (Signed)
 Addended by: LEAR, Kayde Warehime P on: 04/30/2024 05:08 PM   Modules accepted: Orders

## 2024-05-03 ENCOUNTER — Encounter (HOSPITAL_COMMUNITY)
Admission: RE | Admit: 2024-05-03 | Discharge: 2024-05-03 | Disposition: A | Source: Ambulatory Visit | Attending: Internal Medicine

## 2024-05-03 DIAGNOSIS — Z952 Presence of prosthetic heart valve: Secondary | ICD-10-CM | POA: Diagnosis not present

## 2024-05-05 ENCOUNTER — Encounter (HOSPITAL_COMMUNITY): Admission: RE | Admit: 2024-05-05 | Discharge: 2024-05-05 | Attending: Internal Medicine

## 2024-05-05 DIAGNOSIS — Z952 Presence of prosthetic heart valve: Secondary | ICD-10-CM

## 2024-05-07 ENCOUNTER — Encounter (HOSPITAL_COMMUNITY)

## 2024-05-07 DIAGNOSIS — Z952 Presence of prosthetic heart valve: Secondary | ICD-10-CM | POA: Diagnosis not present

## 2024-05-10 ENCOUNTER — Encounter (HOSPITAL_COMMUNITY): Admission: RE | Admit: 2024-05-10 | Discharge: 2024-05-10 | Attending: Internal Medicine

## 2024-05-10 DIAGNOSIS — Z952 Presence of prosthetic heart valve: Secondary | ICD-10-CM

## 2024-05-12 ENCOUNTER — Encounter (HOSPITAL_COMMUNITY)
Admission: RE | Admit: 2024-05-12 | Discharge: 2024-05-12 | Disposition: A | Source: Ambulatory Visit | Attending: Internal Medicine

## 2024-05-12 DIAGNOSIS — Z952 Presence of prosthetic heart valve: Secondary | ICD-10-CM | POA: Diagnosis not present

## 2024-05-14 ENCOUNTER — Encounter (HOSPITAL_COMMUNITY)

## 2024-05-17 ENCOUNTER — Encounter (HOSPITAL_COMMUNITY)
Admission: RE | Admit: 2024-05-17 | Discharge: 2024-05-17 | Disposition: A | Discharge status: Home / Self Care | Source: Ambulatory Visit | Attending: Internal Medicine | Admitting: Internal Medicine

## 2024-05-17 DIAGNOSIS — Z952 Presence of prosthetic heart valve: Secondary | ICD-10-CM | POA: Diagnosis not present

## 2024-05-18 NOTE — Progress Notes (Signed)
 Cardiac Individual Treatment Plan  Patient Details  Name: Charlene Tapia MRN: [id number] Date of Birth: 11-Aug-1950 Referring Provider:   Flowsheet Row INTENSIVE CARDIAC REHAB ORIENT from 03/02/2024 in [facility]  Referring Provider Vina Gull, MD    Initial Encounter Date:  Flowsheet Row INTENSIVE CARDIAC REHAB ORIENT from 03/02/2024 in [facility]  Date 03/02/24    Visit Diagnosis: 02/04/23 S/P TAVR (transcatheter aortic valve replacement)  Patient's Home Medications on Admission: Current Medications[1]  Past Medical History: Past Medical History:  Diagnosis Date   Allergy    Colon cancer screening 04/30/2024   High cholesterol    HTN (hypertension) 10/18/2022   Osteoarthritis    knees; thumbs (10/01/2013)   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 Wendel and Dr. Maryjane   Severe aortic stenosis     Tobacco Use: Tobacco Use History[2]  Labs: Review Flowsheet  More data exists      Latest Ref Rng & Units 11/25/2022 02/04/2023 07/03/2023 07/21/2023 02/02/2024  Labs for ITP Cardiac and Pulmonary Rehab  Cholestrol 100 - 199 mg/dL - - - 792  848   LDL (calc) 0 - 99 mg/dL - - - 857  85   HDL-C >60 mg/dL - - - 43  48   Trlycerides 0 - 149 mg/dL - - - 877  94   PH, Arterial 7.35 - 7.45 7.366  - - - -  PCO2 arterial 32 - 48 mmHg 45.2  - - - -  Bicarbonate 20.0 - 28.0 mmol/L 19.0  26.1  25.9  - - - -  TCO2 22 - 32 mmol/L 20  28  27  23  24   - -  Acid-base deficit 0.0 - 2.0 mmol/L 8.0  - - - -  O2 Saturation % 73  77  92  - - - -    Details       Multiple values from one day are sorted in reverse-chronological order         Capillary Blood Glucose: Lab Results  Component Value Date   GLUCAP 131 (H) 08/17/2013     Exercise Target Goals: Exercise Program  Goal: Individual exercise prescription set using results from initial 6 min walk test and THRR while considering  patients activity barriers and safety.   Exercise Prescription Goal: Initial exercise prescription builds to 30-45 minutes a day of aerobic activity, 2-3 days per week.  Home exercise guidelines will be given to patient during program as part of exercise prescription that the participant will acknowledge.  Activity Barriers & Risk Stratification:  Activity Barriers & Cardiac Risk Stratification - 03/02/24 1411       Activity Barriers & Cardiac Risk Stratification   Activity Barriers Shortness of Breath;Muscular Weakness;Deconditioning;Left Knee Replacement;Right Knee Replacement;Arthritis;Balance Concerns;History of Falls;Assistive Device    Cardiac Risk Stratification High          6 Minute Walk:  6 Minute Walk     Row Name 03/02/24 1540         6 Minute Walk   Phase Initial     Distance 955 feet     Walk Time 6 minutes     # of Rest Breaks 0     MPH 1.8     METS 1.2     RPE 11  Perceived Dyspnea  1     VO2 Peak 3.6     Symptoms Yes (comment)     Comments Mild SOB, RPD =1 resolved with rest     Resting HR 74 bpm     Resting BP 106/64     Resting Oxygen Saturation  95 %     Exercise Oxygen Saturation  during 6 min walk 95 %     Max Ex. HR 85 bpm     Max Ex. BP 152/76     2 Minute Post BP 130/64        Oxygen Initial Assessment:   Oxygen Re-Evaluation:   Oxygen Discharge (Final Oxygen Re-Evaluation):   Initial Exercise Prescription:  Initial Exercise Prescription - 03/02/24 1500       Date of Initial Exercise RX and Referring Provider   Date 03/02/24    Referring Provider Vina Gull, MD    Expected Discharge Date 05/26/24      NuStep   Level 1    SPM 75    Minutes 25    METs 1.2      Prescription Details   Frequency (times per week) 3    Duration Progress to 30 minutes of continuous aerobic without signs/symptoms of physical  distress      Intensity   THRR 40-80% of Max Heartrate 60-119    Ratings of Perceived Exertion 11-13    Perceived Dyspnea 0-4      Progression   Progression Continue progressive overload as per policy without signs/symptoms or physical distress.      Resistance Training   Training Prescription Yes    Weight 2 lbs    Reps 10-15          Perform Capillary Blood Glucose checks as needed.  Exercise Prescription Changes:   Exercise Prescription Changes     Row Name 03/08/24 1453 04/02/24 1628 04/19/24 1637 05/05/24 1735       Response to Exercise   Blood Pressure (Admit) 126/58 130/70 122/60 110/48    Blood Pressure (Exercise) 132/60 -- -- --    Blood Pressure (Exit) 106/78 118/58 110/56 104/72    Heart Rate (Admit) 71 bpm 80 bpm 84 bpm 85 bpm    Heart Rate (Exercise) 92 bpm 117 bpm 111 bpm 103 bpm    Heart Rate (Exit) 80 bpm 89 bpm 83 bpm 74 bpm    Rating of Perceived Exertion (Exercise) 13 11 11.5 11    Symptoms None None None None    Comments Off to a good start with exercise. Reviewed MET's and goals Reviewed MET's and home ExRx Reviewed MET's and goals    Duration Progress to 30 minutes of  aerobic without signs/symptoms of physical distress Progress to 30 minutes of  aerobic without signs/symptoms of physical distress Progress to 30 minutes of  aerobic without signs/symptoms of physical distress Progress to 30 minutes of  aerobic without signs/symptoms of physical distress    Intensity THRR unchanged THRR unchanged THRR unchanged THRR unchanged      Progression   Progression Continue to progress workloads to maintain intensity without signs/symptoms of physical distress. Continue to progress workloads to maintain intensity without signs/symptoms of physical distress. Continue to progress workloads to maintain intensity without signs/symptoms of physical distress. Continue to progress workloads to maintain intensity without signs/symptoms of physical distress.    Average  METs 1.7 1.7 1.8 2      Resistance Training   Training Prescription Yes Yes Yes --  Weight 2 lbs 2 lbs 2 lbs 2 lbs    Reps 10-15 10-15 10-15 10-15    Time 5 Minutes 5 Minutes 5 Minutes 5 Minutes      Interval Training   Interval Training No No No No      NuStep   Level 1 3 4 4     SPM 88 130 94 104    Minutes 26 30 30 30     METs 1.7 1.7 1.8  MET's taken from last session, they were not recorded today 2      Home Exercise Plan   Plans to continue exercise at -- -- Home (comment) Home (comment)    Frequency -- -- Add 2 additional days to program exercise sessions. Add 2 additional days to program exercise sessions.    Initial Home Exercises Provided -- -- 02/18/24 02/18/24       Exercise Comments:   Exercise Comments     Row Name 03/08/24 1557 04/02/24 1634 04/19/24 1639 05/05/24 1738     Exercise Comments Charlene Tapia tolerated low intensity exercise fair without symptoms. Intially the recumbent stepper was uncomfortable, chaffing the groing, but after adjusting the seat back, she found it much more comfortable, and she was able to complete 26 minutes. Oriented to the stretching routine. Reviewed MET's and goals. Pt tolerated exercise well with an average MET level of 1.7. She is feeling good with exercise and is INC WL's, but not MET's. Talked today about increasing the depth of step for greater increases in MET's. She is now able to achieve 30 mins and is feeling better about her balance. She does have a goal to walk more and improve overall strength and flex. We may add in Octane or maybe track with a rollator, but want to focus first on increasing endurance and MET's on the nustep Reviewed MET's and home ExRx. Pt tolerated exercise well with an average MET level of 1.8. She has been feeling good with exercise, adjusted her seat and arms to get a better extention, unfortantly we did not get her MET's today, MET's from last session were 1.8. She is going to exercise on her own by using her  recumbent bike and trying some chair fitness classes on youtube 1-2 days for 30 mins (broken up into smaller bouts as needed) Reviewed MET's and goals. Pt tolerated exercise well with an average MET level of 1.7. She is feeling good with exercise. She says her ADLs are getting easier. She can walk more without stopping and has been able to decorate for christmas. She says she notices a huge change since starting the program       Exercise Goals and Review:   Exercise Goals     Row Name 03/02/24 1327             Exercise Goals   Increase Physical Activity Yes       Intervention Provide advice, education, support and counseling about physical activity/exercise needs.;Develop an individualized exercise prescription for aerobic and resistive training based on initial evaluation findings, risk stratification, comorbidities and participant's personal goals.       Expected Outcomes Short Term: Attend rehab on a regular basis to increase amount of physical activity.;Long Term: Add in home exercise to make exercise part of routine and to increase amount of physical activity.;Long Term: Exercising regularly at least 3-5 days a week.       Increase Strength and Stamina Yes       Intervention Provide advice, education, support and  counseling about physical activity/exercise needs.;Develop an individualized exercise prescription for aerobic and resistive training based on initial evaluation findings, risk stratification, comorbidities and participant's personal goals.       Expected Outcomes Short Term: Increase workloads from initial exercise prescription for resistance, speed, and METs.;Short Term: Perform resistance training exercises routinely during rehab and add in resistance training at home;Long Term: Improve cardiorespiratory fitness, muscular endurance and strength as measured by increased METs and functional capacity ( )       Able to understand and use rate of perceived exertion (RPE) scale  Yes       Intervention Provide education and explanation on how to use RPE scale       Expected Outcomes Short Term: Able to use RPE daily in rehab to express subjective intensity level;Long Term:  Able to use RPE to guide intensity level when exercising independently       Knowledge and understanding of Target Heart Rate Range (THRR) Yes       Intervention Provide education and explanation of THRR including how the numbers were predicted and where they are located for reference       Expected Outcomes Short Term: Able to state/look up THRR;Short Term: Able to use daily as guideline for intensity in rehab;Long Term: Able to use THRR to govern intensity when exercising independently       Understanding of Exercise Prescription Yes       Intervention Provide education, explanation, and written materials on patient's individual exercise prescription       Expected Outcomes Short Term: Able to explain program exercise prescription;Long Term: Able to explain home exercise prescription to exercise independently          Exercise Goals Re-Evaluation :  Exercise Goals Re-Evaluation     Row Name 03/08/24 1557 04/02/24 1630 05/05/24 1736         Exercise Goal Re-Evaluation   Exercise Goals Review Increase Physical Activity;Increase Strength and Stamina;Able to understand and use rate of perceived exertion (RPE) scale Increase Physical Activity;Increase Strength and Stamina;Able to understand and use rate of perceived exertion (RPE) scale;Understanding of Exercise Prescription;Knowledge and understanding of Target Heart Rate Range (THRR) Increase Physical Activity;Increase Strength and Stamina;Able to understand and use rate of perceived exertion (RPE) scale;Understanding of Exercise Prescription;Knowledge and understanding of Target Heart Rate Range (THRR)     Comments Charlene Tapia is able to understand and use RPE scale appropriately. Reviewed MET's and goals. Pt tolerated exercise well with an average MET  level of 1.7. She is feeling good with exercise and is INC WL's, but not MET's. Talked today about increasing the depth of step for greater increases in MET's. She is now able to achieve 30 mins and is feeling better about her balance. She does have a goal to walk more and improve overall strength and flex. We may add in Octane or maybe track with a rollator, but want to focus first on increasing endurance and MET's on the nustep Reviewed MET's and goals. Pt tolerated exercise well with an average MET level of 1.7. She is feeling good with exercise. She says her ADLs are getting easier. She can walk more without stopping and has been able to decorate for christmas. She says she notices a huge change since starting the program     Expected Outcomes Progress workloads as tolerated to help increase strength, stamina, and flexibility. Progress workloads as tolerated to help increase strength, stamina, and flexibility. Progress workloads as tolerated to help increase strength, stamina, and  flexibility.        Discharge Exercise Prescription (Final Exercise Prescription Changes):  Exercise Prescription Changes - 05/05/24 1735       Response to Exercise   Blood Pressure (Admit) 110/48    Blood Pressure (Exit) 104/72    Heart Rate (Admit) 85 bpm    Heart Rate (Exercise) 103 bpm    Heart Rate (Exit) 74 bpm    Rating of Perceived Exertion (Exercise) 11    Symptoms None    Comments Reviewed MET's and goals    Duration Progress to 30 minutes of  aerobic without signs/symptoms of physical distress    Intensity THRR unchanged      Progression   Progression Continue to progress workloads to maintain intensity without signs/symptoms of physical distress.    Average METs 2      Resistance Training   Weight 2 lbs    Reps 10-15    Time 5 Minutes      Interval Training   Interval Training No      NuStep   Level 4    SPM 104    Minutes 30    METs 2      Home Exercise Plan   Plans to continue  exercise at Home (comment)    Frequency Add 2 additional days to program exercise sessions.    Initial Home Exercises Provided 02/18/24          Nutrition:  Target Goals: Understanding of nutrition guidelines, daily intake of sodium 1500mg , cholesterol 200mg , calories 30% from fat and 7% or less from saturated fats, daily to have 5 or more servings of fruits and vegetables.  Biometrics:  Pre Biometrics - 03/02/24 1400       Pre Biometrics   Waist Circumference 56.5 inches    Hip Circumference 54.5 inches    Waist to Hip Ratio 1.04 %    Triceps Skinfold 42 mm    % Body Fat 62 %    Grip Strength 19 kg    Flexibility 9 in    Single Leg Stand --   No perfromed, pt refused: afraid of falling          Nutrition Therapy Plan and Nutrition Goals:   Nutrition Assessments:  MEDIFICTS Score Key: >=70 Need to make dietary changes  40-70 Heart Healthy Diet <= 40 Therapeutic Level Cholesterol Diet   Flowsheet Row INTENSIVE CARDIAC REHAB from 03/08/2024 in [facility]  Picture Your Plate Total Score on Admission 61   Picture Your Plate Scores: <59 Unhealthy dietary pattern with much room for improvement. 41-50 Dietary pattern unlikely to meet recommendations for good health and room for improvement. 51-60 More healthful dietary pattern, with some room for improvement.  >60 Healthy dietary pattern, although there may be some specific behaviors that could be improved.    Nutrition Goals Re-Evaluation:   Nutrition Goals Re-Evaluation:   Nutrition Goals Discharge (Final Nutrition Goals Re-Evaluation):   Psychosocial: Target Goals: Acknowledge presence or absence of significant depression and/or stress, maximize coping skills, provide positive support system. Participant is able to verbalize types and ability to use techniques and skills needed for reducing stress and depression.  Initial Review & Psychosocial  Screening:  Initial Psych Review & Screening - 03/02/24 1403       Initial Review   Current issues with None Identified      Family Dynamics   Good Support System? Yes   Charlene Tapia has her husband  for support     Barriers   Psychosocial barriers to participate in program There are no identifiable barriers or psychosocial needs.      Screening Interventions   Interventions Encouraged to exercise          Quality of Life Scores:  Quality of Life - 03/02/24 1407       Quality of Life   Select Quality of Life      Quality of Life Scores   Health/Function Pre 20.7 %    Socioeconomic Pre 30 %    Psych/Spiritual Pre 26.64 %    Family Pre 28.5 %    GLOBAL Pre 24.67 %         Scores of 19 and below usually indicate a poorer quality of life in these areas.  A difference of  2-3 points is a clinically meaningful difference.  A difference of 2-3 points in the total score of the Quality of Life Index has been associated with significant improvement in overall quality of life, self-image, physical symptoms, and general health in studies assessing change in quality of life.  PHQ-9: Review Flowsheet  More data exists      03/05/2024 03/02/2024 02/25/2023 11/01/2022 10/18/2022  Depression screen PHQ 2/9  Decreased Interest 0 0 0 0 0  Down, Depressed, Hopeless 0 0 0 0 0  PHQ - 2 Score 0 0 0 0 0  Altered sleeping 2 0 0 - -  Tired, decreased energy 0 0 0 - -  Change in appetite 0 3 0 - -  Feeling bad or failure about yourself  0 0 0 - -  Trouble concentrating 0 0 0 - -  Moving slowly or fidgety/restless 0 0 0 - -  Suicidal thoughts 0 0 0 - -  PHQ-9 Score 2  3  0  - -  Difficult doing work/chores Not difficult at all Not difficult at all Not difficult at all - -    Details       Data saved with a previous flowsheet row definition        Interpretation of Total Score  Total Score Depression Severity:  1-4 = Minimal depression, 5-9 = Mild depression, 10-14 = Moderate depression,  15-19 = Moderately severe depression, 20-27 = Severe depression   Psychosocial Evaluation and Intervention:   Psychosocial Re-Evaluation:  Psychosocial Re-Evaluation     Row Name 03/08/24 1651 03/23/24 1209 04/20/24 1727 05/18/24 1643       Psychosocial Re-Evaluation   Current issues with None Identified None Identified None Identified None Identified    Interventions Encouraged to attend Cardiac Rehabilitation for the exercise Encouraged to attend Cardiac Rehabilitation for the exercise Encouraged to attend Cardiac Rehabilitation for the exercise Encouraged to attend Cardiac Rehabilitation for the exercise    Continue Psychosocial Services  No Follow up required No Follow up required No Follow up required No Follow up required       Psychosocial Discharge (Final Psychosocial Re-Evaluation):  Psychosocial Re-Evaluation - 05/18/24 1643       Psychosocial Re-Evaluation   Current issues with None Identified    Interventions Encouraged to attend Cardiac Rehabilitation for the exercise    Continue Psychosocial Services  No Follow up required          Vocational Rehabilitation: Provide vocational rehab assistance to qualifying candidates.   Vocational Rehab Evaluation & Intervention:  Vocational Rehab - 03/02/24 1403       Initial Vocational Rehab Evaluation & Intervention   Assessment shows  need for Vocational Rehabilitation No   Charlene Tapia is recently retired and does not need vocational rehab at this time         Education: Education Goals: Education classes will be provided on a weekly basis, covering required topics. Participant will state understanding/return demonstration of topics presented.    Education     Row Name 03/08/24 1400     Education   Cardiac Education Topics Pritikin   Writer Psychosocial   Psychosocial Healthy Minds, Bodies, Hearts   Instruction Review Code 1- Verbalizes Understanding    Class Start Time 1400   Class Stop Time 1434   Class Time Calculation (min) 34 min    Row Name 03/10/24 1500     Education   Cardiac Education Topics Pritikin   Secondary School Teacher School   Educator Nurse;Respiratory Therapist   Weekly Topic Adding Flavor - Sodium-Free   Instruction Review Code 1- Verbalizes Understanding   Class Start Time 1357   Class Stop Time 1432   Class Time Calculation (min) 35 min    Row Name 03/15/24 1400     Education   Cardiac Education Topics Pritikin   Geographical Information Systems Officer Exercise   Exercise Workshop Location Manager and Fall Prevention   Instruction Review Code 1- Verbalizes Understanding   Class Start Time 1408   Class Stop Time 1500   Class Time Calculation (min) 52 min    Row Name 03/17/24 1500     Education   Cardiac Education Topics Pritikin   Customer Service Manager   Weekly Topic Fast and Healthy Breakfasts   Instruction Review Code 1- Verbalizes Understanding   Class Start Time 1400   Class Stop Time 1442   Class Time Calculation (min) 42 min    Row Name 03/19/24 1400     Education   Cardiac Education Topics Pritikin   Licensed Conveyancer Nutrition   Nutrition Other  Label Reading   Instruction Review Code 1- Verbalizes Understanding   Class Start Time 1358   Class Stop Time 1456   Class Time Calculation (min) 58 min    Row Name 03/22/24 1500     Education   Cardiac Education Topics Pritikin   Hospital Doctor Education   General Education Metabolic Syndrome and Belly Fat   Instruction Review Code 1- Verbalizes Understanding   Class Start Time 1407   Class Stop Time 1445   Class Time Calculation (min) 38 min    Row Name 03/24/24 1400     Education   Cardiac Education Topics Pritikin    Orthoptist   Educator Dietitian   Weekly Topic Personalizing Your Pritikin Plate   Instruction Review Code 1- Verbalizes Understanding   Class Start Time 1358   Class Stop Time 1436   Class Time Calculation (min) 38 min    Row Name 03/26/24 1400     Education   Cardiac Education Topics Pritikin   Licensed Conveyancer Nutrition   Nutrition Overview  of the Pritikin Eating Plan   Instruction Review Code 1- Verbalizes Understanding   Class Start Time 1400   Class Stop Time 1444   Class Time Calculation (min) 44 min    Row Name 03/29/24 1400     Education   Cardiac Education Topics Pritikin   Select Workshops     Workshops   Educator Exercise Physiologist   Select Psychosocial   Psychosocial Workshop Recognizing and Reducing Stress   Instruction Review Code 1- Verbalizes Understanding   Class Start Time 1359   Class Stop Time 1445   Class Time Calculation (min) 46 min    Row Name 03/31/24 1300     Education   Cardiac Education Topics Pritikin   Customer Service Manager   Weekly Topic [employer] Desserts   Instruction Review Code 1- Verbalizes Understanding   Class Start Time 1400   Class Stop Time 1438   Class Time Calculation (min) 38 min    Row Name 04/02/24 1400     Education   Cardiac Education Topics Pritikin   Licensed Conveyancer Nutrition   Nutrition Calorie Density   Instruction Review Code 1- Verbalizes Understanding   Class Start Time 1400   Class Stop Time 1435   Class Time Calculation (min) 35 min    Row Name 04/05/24 1400     Education   Cardiac Education Topics Pritikin   Psychologist, Forensic Exercise Education   Exercise Education Move It!   Instruction Review Code 1- Verbalizes Understanding   Class Start Time 1412    Class Stop Time 1447   Class Time Calculation (min) 35 min    Row Name 04/07/24 1400     Education   Cardiac Education Topics Pritikin   Customer Service Manager   Weekly Topic Efficiency Cooking - Meals in a Snap   Instruction Review Code 1- Verbalizes Understanding   Class Start Time 1400   Class Stop Time 1444   Class Time Calculation (min) 44 min    Row Name 04/09/24 1400     Education   Cardiac Education Topics Pritikin   Geographical Information Systems Officer Exercise   Exercise Workshop Exercise Basics: Diplomatic Services Operational Officer   Instruction Review Code 1- Verbalizes Understanding   Class Start Time 1400   Class Stop Time 1452   Class Time Calculation (min) 52 min    Row Name 04/12/24 1400     Education   Cardiac Education Topics Pritikin   Glass Blower/designer Nutrition   Nutrition Workshop Targeting Your Nutrition Priorities   Instruction Review Code 1- Verbalizes Understanding   Class Start Time 1400   Class Stop Time 1445   Class Time Calculation (min) 45 min    Row Name 04/14/24 1400     Education   Cardiac Education Topics Pritikin   Customer Service Manager   Weekly Topic One-Pot Wonders   Instruction Review Code 1- Verbalizes Understanding   Class Start Time 1400   Class Stop Time 1445   Class Time Calculation (min) 45 min  Row Name 04/19/24 1400     Education   Cardiac Education Topics Pritikin   Select Workshops     Workshops   Educator Exercise Physiologist   Select Psychosocial   Psychosocial Workshop Focused Goals, Sustainable Changes   Instruction Review Code 1- Verbalizes Understanding   Class Start Time 1355   Class Stop Time 1438   Class Time Calculation (min) 43 min    Row Name 04/21/24 1400     Education   Cardiac Education Topics Pritikin   Teacher, Music   Weekly Topic Comforting Weekend Breakfasts   Instruction Review Code 1- Verbalizes Understanding   Class Start Time 1400   Class Stop Time 1434   Class Time Calculation (min) 34 min    Row Name 04/23/24 1400     Education   Cardiac Education Topics Pritikin   Licensed Conveyancer Nutrition   Nutrition Dining Out - Part 1   Instruction Review Code 1- Verbalizes Understanding   Class Start Time 1400   Class Stop Time 1440   Class Time Calculation (min) 40 min    Row Name 04/28/24 1400     Education   Cardiac Education Topics Pritikin   Orthoptist   Educator Dietitian   Weekly Topic Fast Evening Meals   Instruction Review Code 1- Verbalizes Understanding   Class Start Time 1403   Class Stop Time 1447   Class Time Calculation (min) 44 min    Row Name 04/30/24 1400     Education   Cardiac Education Topics Pritikin   Select Core Videos     Core Videos   Educator Dietitian   Select Nutrition   Nutrition Vitamins and Minerals   Instruction Review Code 1- Verbalizes Understanding   Class Start Time 1400   Class Stop Time 1437   Class Time Calculation (min) 37 min    Row Name 05/03/24 1400     Education   Cardiac Education Topics Pritikin   Psychologist, Forensic Exercise Education   Exercise Education Improving Performance   Instruction Review Code 1- Verbalizes Understanding   Class Start Time 1400   Class Stop Time 1443   Class Time Calculation (min) 43 min    Row Name 05/05/24 1400     Education   Cardiac Education Topics Pritikin   Customer Service Manager   Weekly Topic International Cuisine- Spotlight on the [employer] Zones   Instruction Review Code 1- Verbalizes Understanding   Class Start Time 1400   Class Stop Time 1437   Class Time  Calculation (min) 37 min    Row Name 05/07/24 1400     Education   Cardiac Education Topics Pritikin   Glass Blower/designer Nutrition   Nutrition Workshop Fueling a Forensic Psychologist   Instruction Review Code 1- Tax Inspector   Class Start Time 1400   Class Stop Time 1455   Class Time Calculation (min) 55 min    Row Name 05/10/24 1400     Education   Cardiac Education Topics Pritikin   Public Librarian  Select Psychosocial   Psychosocial Workshop Healthy Sleep for a Healthy Heart   Instruction Review Code 1- Verbalizes Understanding   Class Start Time 1402   Class Stop Time 1453   Class Time Calculation (min) 51 min    Row Name 05/12/24 1300     Education   Cardiac Education Topics Pritikin   Customer Service Manager   Weekly Topic Simple Sides and Sauces   Instruction Review Code 1- Verbalizes Understanding   Class Start Time 1400   Class Stop Time 1433   Class Time Calculation (min) 33 min    Row Name 05/17/24 1400     Education   Cardiac Education Topics Pritikin   Nurse, Children's Exercise Physiologist   Select Psychosocial   Psychosocial How Our Thoughts Can Heal Our Hearts   Instruction Review Code 1- Verbalizes Understanding   Class Start Time 1400   Class Stop Time 1450   Class Time Calculation (min) 50 min      Core Videos: Exercise    Move It!  Clinical staff conducted group or individual video education with verbal and written material and guidebook.  Patient learns the recommended Pritikin exercise program. Exercise with the goal of living a long, healthy life. Some of the health benefits of exercise include controlled diabetes, healthier blood pressure levels, improved cholesterol levels, improved heart and lung capacity, improved sleep, and better body composition. Everyone  should speak with their doctor before starting or changing an exercise routine.  Biomechanical Limitations Clinical staff conducted group or individual video education with verbal and written material and guidebook.  Patient learns how biomechanical limitations can impact exercise and how we can mitigate and possibly overcome limitations to have an impactful and balanced exercise routine.  Body Composition Clinical staff conducted group or individual video education with verbal and written material and guidebook.  Patient learns that body composition (ratio of muscle mass to fat mass) is a key component to assessing overall fitness, rather than body weight alone. Increased fat mass, especially visceral belly fat, can put us  at increased risk for metabolic syndrome, type 2 diabetes, heart disease, and even death. It is recommended to combine diet and exercise (cardiovascular and resistance training) to improve your body composition. Seek guidance from your physician and exercise physiologist before implementing an exercise routine.  Exercise Action Plan Clinical staff conducted group or individual video education with verbal and written material and guidebook.  Patient learns the recommended strategies to achieve and enjoy long-term exercise adherence, including variety, self-motivation, self-efficacy, and positive decision making. Benefits of exercise include fitness, good health, weight management, more energy, better sleep, less stress, and overall well-being.  Medical   Heart Disease Risk Reduction Clinical staff conducted group or individual video education with verbal and written material and guidebook.  Patient learns our heart is our most vital organ as it circulates oxygen, nutrients, white blood cells, and hormones throughout the entire body, and carries waste away. Data supports a plant-based eating plan like the Pritikin Program for its effectiveness in slowing progression of and  reversing heart disease. The video provides a number of recommendations to address heart disease.   Metabolic Syndrome and Belly Fat  Clinical staff conducted group or individual video education with verbal and written material and guidebook.  Patient learns what metabolic syndrome is, how it leads to heart disease, and how one can reverse it  and keep it from coming back. You have metabolic syndrome if you have 3 of the following 5 criteria: abdominal obesity, high blood pressure, high triglycerides, low HDL cholesterol, and high blood sugar.  Hypertension and Heart Disease Clinical staff conducted group or individual video education with verbal and written material and guidebook.  Patient learns that high blood pressure, or hypertension, is very common in the [place] . Hypertension is largely due to excessive salt intake, but other important risk factors include being overweight, physical inactivity, drinking too much alcohol, smoking, and not eating enough potassium from fruits and vegetables. High blood pressure is a leading risk factor for heart attack, stroke, congestive heart failure, dementia, kidney failure, and premature death. Long-term effects of excessive salt intake include stiffening of the arteries and thickening of heart muscle and organ damage. Recommendations include ways to reduce hypertension and the risk of heart disease.  Diseases of Our Time - Focusing on Diabetes Clinical staff conducted group or individual video education with verbal and written material and guidebook.  Patient learns why the best way to stop diseases of our time is prevention, through food and other lifestyle changes. Medicine (such as prescription pills and surgeries) is often only a Band-Aid on the problem, not a long-term solution. Most common diseases of our time include obesity, type 2 diabetes, hypertension, heart disease, and cancer. The Pritikin Program is recommended and has been proven to help  reduce, reverse, and/or prevent the damaging effects of metabolic syndrome.  Nutrition   Overview of the Pritikin Eating Plan  Clinical staff conducted group or individual video education with verbal and written material and guidebook.  Patient learns about the Pritikin Eating Plan for disease risk reduction. The Pritikin Eating Plan emphasizes a wide variety of unrefined, minimally-processed carbohydrates, like fruits, vegetables, whole grains, and legumes. Go, Caution, and Stop food choices are explained. Plant-based and lean animal proteins are emphasized. Rationale provided for low sodium intake for blood pressure control, low added sugars for blood sugar stabilization, and low added fats and oils for coronary artery disease risk reduction and weight management.  Calorie Density  Clinical staff conducted group or individual video education with verbal and written material and guidebook.  Patient learns about calorie density and how it impacts the Pritikin Eating Plan. Knowing the characteristics of the food you choose will help you decide whether those foods will lead to weight gain or weight loss, and whether you want to consume more or less of them. Weight loss is usually a side effect of the Pritikin Eating Plan because of its focus on low calorie-dense foods.  Label Reading  Clinical staff conducted group or individual video education with verbal and written material and guidebook.  Patient learns about the Pritikin recommended label reading guidelines and corresponding recommendations regarding calorie density, added sugars, sodium content, and whole grains.  Dining Out - Part 1  Clinical staff conducted group or individual video education with verbal and written material and guidebook.  Patient learns that restaurant meals can be sabotaging because they can be so high in calories, fat, sodium, and/or sugar. Patient learns recommended strategies on how to positively address this and avoid  unhealthy pitfalls.  Facts on Fats  Clinical staff conducted group or individual video education with verbal and written material and guidebook.  Patient learns that lifestyle modifications can be just as effective, if not more so, as many medications for lowering your risk of heart disease. A Pritikin lifestyle can help to reduce your risk  of inflammation and atherosclerosis (cholesterol build-up, or plaque, in the artery walls). Lifestyle interventions such as dietary choices and physical activity address the cause of atherosclerosis. A review of the types of fats and their impact on blood cholesterol levels, along with dietary recommendations to reduce fat intake is also included.  Nutrition Action Plan  Clinical staff conducted group or individual video education with verbal and written material and guidebook.  Patient learns how to incorporate Pritikin recommendations into their lifestyle. Recommendations include planning and keeping personal health goals in mind as an important part of their success.  Healthy Mind-Set    Healthy Minds, Bodies, Hearts  Clinical staff conducted group or individual video education with verbal and written material and guidebook.  Patient learns how to identify when they are stressed. Video will discuss the impact of that stress, as well as the many benefits of stress management. Patient will also be introduced to stress management techniques. The way we think, act, and feel has an impact on our hearts.  How Our Thoughts Can Heal Our Hearts  Clinical staff conducted group or individual video education with verbal and written material and guidebook.  Patient learns that negative thoughts can cause depression and anxiety. This can result in negative lifestyle behavior and serious health problems. Cognitive behavioral therapy is an effective method to help control our thoughts in order to change and improve our emotional outlook.  Additional Videos:  Exercise     Improving Performance  Clinical staff conducted group or individual video education with verbal and written material and guidebook.  Patient learns to use a non-linear approach by alternating intensity levels and lengths of time spent exercising to help burn more calories and lose more body fat. Cardiovascular exercise helps improve heart health, metabolism, hormonal balance, blood sugar control, and recovery from fatigue. Resistance training improves strength, endurance, balance, coordination, reaction time, metabolism, and muscle mass. Flexibility exercise improves circulation, posture, and balance. Seek guidance from your physician and exercise physiologist before implementing an exercise routine and learn your capabilities and proper form for all exercise.  Introduction to Yoga  Clinical staff conducted group or individual video education with verbal and written material and guidebook.  Patient learns about yoga, a discipline of the coming together of mind, breath, and body. The benefits of yoga include improved flexibility, improved range of motion, better posture and core strength, increased lung function, weight loss, and positive self-image. Yogas heart health benefits include lowered blood pressure, healthier heart rate, decreased cholesterol and triglyceride levels, improved immune function, and reduced stress. Seek guidance from your physician and exercise physiologist before implementing an exercise routine and learn your capabilities and proper form for all exercise.  Medical   Aging: Enhancing Your Quality of Life  Clinical staff conducted group or individual video education with verbal and written material and guidebook.  Patient learns key strategies and recommendations to stay in good physical health and enhance quality of life, such as prevention strategies, having an advocate, securing a Health Care Proxy and Power of Attorney, and keeping a list of medications and system for  tracking them. It also discusses how to avoid risk for bone loss.  Biology of Weight Control  Clinical staff conducted group or individual video education with verbal and written material and guidebook.  Patient learns that weight gain occurs because we consume more calories than we burn (eating more, moving less). Even if your body weight is normal, you may have higher ratios of fat compared to muscle mass.  Too much body fat puts you at increased risk for cardiovascular disease, heart attack, stroke, type 2 diabetes, and obesity-related cancers. In addition to exercise, following the Pritikin Eating Plan can help reduce your risk.  Decoding Lab Results  Clinical staff conducted group or individual video education with verbal and written material and guidebook.  Patient learns that lab test reflects one measurement whose values change over time and are influenced by many factors, including medication, stress, sleep, exercise, food, hydration, pre-existing medical conditions, and more. It is recommended to use the knowledge from this video to become more involved with your lab results and evaluate your numbers to speak with your doctor.   Diseases of Our Time - Overview  Clinical staff conducted group or individual video education with verbal and written material and guidebook.  Patient learns that according to the CDC, 50% to 70% of chronic diseases (such as obesity, type 2 diabetes, elevated lipids, hypertension, and heart disease) are avoidable through lifestyle improvements including healthier food choices, listening to satiety cues, and increased physical activity.  Sleep Disorders Clinical staff conducted group or individual video education with verbal and written material and guidebook.  Patient learns how good quality and duration of sleep are important to overall health and well-being. Patient also learns about sleep disorders and how they impact health along with recommendations to address  them, including discussing with a physician.  Nutrition  Dining Out - Part 2 Clinical staff conducted group or individual video education with verbal and written material and guidebook.  Patient learns how to plan ahead and communicate in order to maximize their dining experience in a healthy and nutritious manner. Included are recommended food choices based on the type of restaurant the patient is visiting.   Fueling a Banker conducted group or individual video education with verbal and written material and guidebook.  There is a strong connection between our food choices and our health. Diseases like obesity and type 2 diabetes are very prevalent and are in large-part due to lifestyle choices. The Pritikin Eating Plan provides plenty of food and hunger-curbing satisfaction. It is easy to follow, affordable, and helps reduce health risks.  Menu Workshop  Clinical staff conducted group or individual video education with verbal and written material and guidebook.  Patient learns that restaurant meals can sabotage health goals because they are often packed with calories, fat, sodium, and sugar. Recommendations include strategies to plan ahead and to communicate with the manager, chef, or server to help order a healthier meal.  Planning Your Eating Strategy  Clinical staff conducted group or individual video education with verbal and written material and guidebook.  Patient learns about the Pritikin Eating Plan and its benefit of reducing the risk of disease. The Pritikin Eating Plan does not focus on calories. Instead, it emphasizes high-quality, nutrient-rich foods. By knowing the characteristics of the foods, we choose, we can determine their calorie density and make informed decisions.  Targeting Your Nutrition Priorities  Clinical staff conducted group or individual video education with verbal and written material and guidebook.  Patient learns that lifestyle habits have  a tremendous impact on disease risk and progression. This video provides eating and physical activity recommendations based on your personal health goals, such as reducing LDL cholesterol, losing weight, preventing or controlling type 2 diabetes, and reducing high blood pressure.  Vitamins and Minerals  Clinical staff conducted group or individual video education with verbal and written material and guidebook.  Patient learns different  ways to obtain key vitamins and minerals, including through a recommended healthy diet. It is important to discuss all supplements you take with your doctor.   Healthy Mind-Set    Smoking Cessation  Clinical staff conducted group or individual video education with verbal and written material and guidebook.  Patient learns that cigarette smoking and tobacco addiction pose a serious health risk which affects millions of people. Stopping smoking will significantly reduce the risk of heart disease, lung disease, and many forms of cancer. Recommended strategies for quitting are covered, including working with your doctor to develop a successful plan.  Culinary   Becoming a Set Designer conducted group or individual video education with verbal and written material and guidebook.  Patient learns that cooking at home can be healthy, cost-effective, quick, and puts them in control. Keys to cooking healthy recipes will include looking at your recipe, assessing your equipment needs, planning ahead, making it simple, choosing cost-effective seasonal ingredients, and limiting the use of added fats, salts, and sugars.  Cooking - Breakfast and Snacks  Clinical staff conducted group or individual video education with verbal and written material and guidebook.  Patient learns how important breakfast is to satiety and nutrition through the entire day. Recommendations include key foods to eat during breakfast to help stabilize blood sugar levels and to prevent  overeating at meals later in the day. Planning ahead is also a key component.  Cooking - Educational Psychologist conducted group or individual video education with verbal and written material and guidebook.  Patient learns eating strategies to improve overall health, including an approach to cook more at home. Recommendations include thinking of animal protein as a side on your plate rather than center stage and focusing instead on lower calorie dense options like vegetables, fruits, whole grains, and plant-based proteins, such as beans. Making sauces in large quantities to freeze for later and leaving the skin on your vegetables are also recommended to maximize your experience.  Cooking - Healthy Salads and Dressing Clinical staff conducted group or individual video education with verbal and written material and guidebook.  Patient learns that vegetables, fruits, whole grains, and legumes are the foundations of the Pritikin Eating Plan. Recommendations include how to incorporate each of these in flavorful and healthy salads, and how to create homemade salad dressings. Proper handling of ingredients is also covered. Cooking - Soups and [employer] - Soups and Desserts Clinical staff conducted group or individual video education with verbal and written material and guidebook.  Patient learns that Pritikin soups and desserts make for easy, nutritious, and delicious snacks and meal components that are low in sodium, fat, sugar, and calorie density, while high in vitamins, minerals, and filling fiber. Recommendations include simple and healthy ideas for soups and desserts.   Overview     The Pritikin Solution Program Overview Clinical staff conducted group or individual video education with verbal and written material and guidebook.  Patient learns that the results of the Pritikin Program have been documented in more than 100 articles published in peer-reviewed journals, and the benefits  include reducing risk factors for (and, in some cases, even reversing) high cholesterol, high blood pressure, type 2 diabetes, obesity, and more! An overview of the three key pillars of the Pritikin Program will be covered: eating well, doing regular exercise, and having a healthy mind-set.  WORKSHOPS  Exercise: Exercise Basics: Building Your Action Plan Clinical staff led group instruction and group discussion with  PowerPoint presentation and patient guidebook. To enhance the learning environment the use of posters, models and videos may be added. At the conclusion of this workshop, patients will comprehend the difference between physical activity and exercise, as well as the benefits of incorporating both, into their routine. Patients will understand the FITT (Frequency, Intensity, Time, and Type) principle and how to use it to build an exercise action plan. In addition, safety concerns and other considerations for exercise and cardiac rehab will be addressed by the presenter. The purpose of this lesson is to promote a comprehensive and effective weekly exercise routine in order to improve patients overall level of fitness.   Managing Heart Disease: Your Path to a Healthier Heart Clinical staff led group instruction and group discussion with PowerPoint presentation and patient guidebook. To enhance the learning environment the use of posters, models and videos may be added.At the conclusion of this workshop, patients will understand the anatomy and physiology of the heart. Additionally, they will understand how Pritikins three pillars impact the risk factors, the progression, and the management of heart disease.  The purpose of this lesson is to provide a high-level overview of the heart, heart disease, and how the Pritikin lifestyle positively impacts risk factors.  Exercise Biomechanics Clinical staff led group instruction and group discussion with PowerPoint presentation and patient  guidebook. To enhance the learning environment the use of posters, models and videos may be added. Patients will learn how the structural parts of their bodies function and how these functions impact their daily activities, movement, and exercise. Patients will learn how to promote a neutral spine, learn how to manage pain, and identify ways to improve their physical movement in order to promote healthy living. The purpose of this lesson is to expose patients to common physical limitations that impact physical activity. Participants will learn practical ways to adapt and manage aches and pains, and to minimize their effect on regular exercise. Patients will learn how to maintain good posture while sitting, walking, and lifting.  Balance Training and Fall Prevention  Clinical staff led group instruction and group discussion with PowerPoint presentation and patient guidebook. To enhance the learning environment the use of posters, models and videos may be added. At the conclusion of this workshop, patients will understand the importance of their sensorimotor skills (vision, proprioception, and the vestibular system) in maintaining their ability to balance as they age. Patients will apply a variety of balancing exercises that are appropriate for their current level of function. Patients will understand the common causes for poor balance, possible solutions to these problems, and ways to modify their physical environment in order to minimize their fall risk. The purpose of this lesson is to teach patients about the importance of maintaining balance as they age and ways to minimize their risk of falling.  WORKSHOPS   Nutrition:  Fueling a Ship Broker led group instruction and group discussion with PowerPoint presentation and patient guidebook. To enhance the learning environment the use of posters, models and videos may be added. Patients will review the foundational principles of the  Pritikin Eating Plan and understand what constitutes a serving size in each of the food groups. Patients will also learn Pritikin-friendly foods that are better choices when away from home and review make-ahead meal and snack options. Calorie density will be reviewed and applied to three nutrition priorities: weight maintenance, weight loss, and weight gain. The purpose of this lesson is to reinforce (in a group setting) the key concepts  around what patients are recommended to eat and how to apply these guidelines when away from home by planning and selecting Pritikin-friendly options. Patients will understand how calorie density may be adjusted for different weight management goals.  Mindful Eating  Clinical staff led group instruction and group discussion with PowerPoint presentation and patient guidebook. To enhance the learning environment the use of posters, models and videos may be added. Patients will briefly review the concepts of the Pritikin Eating Plan and the importance of low-calorie dense foods. The concept of mindful eating will be introduced as well as the importance of paying attention to internal hunger signals. Triggers for non-hunger eating and techniques for dealing with triggers will be explored. The purpose of this lesson is to provide patients with the opportunity to review the basic principles of the Pritikin Eating Plan, discuss the value of eating mindfully and how to measure internal cues of hunger and fullness using the Hunger Scale. Patients will also discuss reasons for non-hunger eating and learn strategies to use for controlling emotional eating.  Targeting Your Nutrition Priorities Clinical staff led group instruction and group discussion with PowerPoint presentation and patient guidebook. To enhance the learning environment the use of posters, models and videos may be added. Patients will learn how to determine their genetic susceptibility to disease by reviewing their  family history. Patients will gain insight into the importance of diet as part of an overall healthy lifestyle in mitigating the impact of genetics and other environmental insults. The purpose of this lesson is to provide patients with the opportunity to assess their personal nutrition priorities by looking at their family history, their own health history and current risk factors. Patients will also be able to discuss ways of prioritizing and modifying the Pritikin Eating Plan for their highest risk areas  Menu  Clinical staff led group instruction and group discussion with PowerPoint presentation and patient guidebook. To enhance the learning environment the use of posters, models and videos may be added. Using menus brought in from [employer], or printed from [employer], patients will apply the Pritikin dining out guidelines that were presented in the [employer] video. Patients will also be able to practice these guidelines in a variety of provided scenarios. The purpose of this lesson is to provide patients with the opportunity to practice hands-on learning of the Pritikin Dining Out guidelines with actual menus and practice scenarios.  Label Reading Clinical staff led group instruction and group discussion with PowerPoint presentation and patient guidebook. To enhance the learning environment the use of posters, models and videos may be added. Patients will review and discuss the Pritikin label reading guidelines presented in Pritikins Label Reading Educational series video. Using fool labels brought in from local grocery stores and markets, patients will apply the label reading guidelines and determine if the packaged food meet the Pritikin guidelines. The purpose of this lesson is to provide patients with the opportunity to review, discuss, and practice hands-on learning of the Pritikin Label Reading guidelines with actual packaged food labels. Cooking School  Pritikins  [employer] are designed to teach patients ways to prepare quick, simple, and affordable recipes at home. The importance of nutritions role in chronic disease risk reduction is reflected in its emphasis in the overall Pritikin program. By learning how to prepare essential core Pritikin Eating Plan recipes, patients will increase control over what they eat; be able to customize the flavor of foods without the use of added salt, sugar,  or fat; and improve the quality of the food they consume. By learning a set of core recipes which are easily assembled, quickly prepared, and affordable, patients are more likely to prepare more healthy foods at home. These workshops focus on convenient breakfasts, simple entres, side dishes, and desserts which can be prepared with minimal effort and are consistent with nutrition recommendations for cardiovascular risk reduction. Cooking [employer] are taught by a armed forces logistics/support/administrative officer (RD) who has been trained by the [employer]. The chef or RD has a clear understanding of the importance of minimizing - if not completely eliminating - added fat, sugar, and sodium in recipes. Throughout the series of Cooking School Workshop sessions, patients will learn about healthy ingredients and efficient methods of cooking to build confidence in their capability to prepare    Cooking School weekly topics:  Adding Flavor- Sodium-Free  Fast and Healthy Breakfasts  Powerhouse Plant-Based Proteins  Satisfying Salads and Dressings  Simple Sides and Sauces  International Cuisine-Spotlight on the [employer] Zones  Delicious Desserts  Savory Soups  [employer] - Meals in a Astronomer Appetizers and Snacks  Comforting Weekend Breakfasts  One-Pot Wonders   Fast Evening Meals  Landscape Architect Your Pritikin Plate  WORKSHOPS   Healthy Mindset (Psychosocial):  Focused Goals, Sustainable Changes Clinical staff led group  instruction and group discussion with PowerPoint presentation and patient guidebook. To enhance the learning environment the use of posters, models and videos may be added. Patients will be able to apply effective goal setting strategies to establish at least one personal goal, and then take consistent, meaningful action toward that goal. They will learn to identify common barriers to achieving personal goals and develop strategies to overcome them. Patients will also gain an understanding of how our mind-set can impact our ability to achieve goals and the importance of cultivating a positive and growth-oriented mind-set. The purpose of this lesson is to provide patients with a deeper understanding of how to set and achieve personal goals, as well as the tools and strategies needed to overcome common obstacles which may arise along the way.  From Head to Heart: The Power of a Healthy Outlook  Clinical staff led group instruction and group discussion with PowerPoint presentation and patient guidebook. To enhance the learning environment the use of posters, models and videos may be added. Patients will be able to recognize and describe the impact of emotions and mood on physical health. They will discover the importance of self-care and explore self-care practices which may work for them. Patients will also learn how to utilize the 4 Cs to cultivate a healthier outlook and better manage stress and challenges. The purpose of this lesson is to demonstrate to patients how a healthy outlook is an essential part of maintaining good health, especially as they continue their cardiac rehab journey.  Healthy Sleep for a Healthy Heart Clinical staff led group instruction and group discussion with PowerPoint presentation and patient guidebook. To enhance the learning environment the use of posters, models and videos may be added. At the conclusion of this workshop, patients will be able to demonstrate knowledge of the  importance of sleep to overall health, well-being, and quality of life. They will understand the symptoms of, and treatments for, common sleep disorders. Patients will also be able to identify daytime and nighttime behaviors which impact sleep, and they will be able to apply these tools to help manage sleep-related challenges. The purpose of this  lesson is to provide patients with a general overview of sleep and outline the importance of quality sleep. Patients will learn about a few of the most common sleep disorders. Patients will also be introduced to the concept of sleep hygiene, and discover ways to self-manage certain sleeping problems through simple daily behavior changes. Finally, the workshop will motivate patients by clarifying the links between quality sleep and their goals of heart-healthy living.   Recognizing and Reducing Stress Clinical staff led group instruction and group discussion with PowerPoint presentation and patient guidebook. To enhance the learning environment the use of posters, models and videos may be added. At the conclusion of this workshop, patients will be able to understand the types of stress reactions, differentiate between acute and chronic stress, and recognize the impact that chronic stress has on their health. They will also be able to apply different coping mechanisms, such as reframing negative self-talk. Patients will have the opportunity to practice a variety of stress management techniques, such as deep abdominal breathing, progressive muscle relaxation, and/or guided imagery.  The purpose of this lesson is to educate patients on the role of stress in their lives and to provide healthy techniques for coping with it.  Learning Barriers/Preferences:  Learning Barriers/Preferences - 03/02/24 1545       Learning Barriers/Preferences   Learning Barriers Sight    Learning Preferences Individual Instruction;Group Instruction;Computer/Internet           Education Topics:  Knowledge Questionnaire Score:  Knowledge Questionnaire Score - 03/02/24 1410       Knowledge Questionnaire Score   Pre Score 23/24          Core Components/Risk Factors/Patient Goals at Admission:  Personal Goals and Risk Factors at Admission - 03/02/24 1546       Core Components/Risk Factors/Patient Goals on Admission    Weight Management Yes;Obesity;Weight Loss    Intervention Weight Management: Develop a combined nutrition and exercise program designed to reach desired caloric intake, while maintaining appropriate intake of nutrient and fiber, sodium and fats, and appropriate energy expenditure required for the weight goal.;Weight Management: Provide education and appropriate resources to help participant work on and attain dietary goals.;Weight Management/Obesity: Establish reasonable short term and long term weight goals.;Obesity: Provide education and appropriate resources to help participant work on and attain dietary goals.    Admit Weight 268 lb 15.4 oz (122 kg)    Expected Outcomes Short Term: Continue to assess and modify interventions until short term weight is achieved;Long Term: Adherence to nutrition and physical activity/exercise program aimed toward attainment of established weight goal;Weight Loss: Understanding of general recommendations for a balanced deficit meal plan, which promotes 1-2 lb weight loss per week and includes a negative energy balance of [phone] kcal/d;Understanding recommendations for meals to include 15-35% energy as protein, 25-35% energy from fat, 35-60% energy from carbohydrates, less than 200mg  of dietary cholesterol, 20-35 gm of total fiber daily;Understanding of distribution of calorie intake throughout the day with the consumption of 4-5 meals/snacks    Lipids Yes    Intervention Provide education and support for participant on nutrition & aerobic/resistive exercise along with prescribed medications to achieve LDL 70mg ,  HDL >40mg .    Expected Outcomes Short Term: Participant states understanding of desired cholesterol values and is compliant with medications prescribed. Participant is following exercise prescription and nutrition guidelines.;Long Term: Cholesterol controlled with medications as prescribed, with individualized exercise RX and with personalized nutrition plan. Value goals: LDL < 70mg , HDL > 40 mg.  Core Components/Risk Factors/Patient Goals Review:   Goals and Risk Factor Review     Row Name 03/08/24 1655 03/23/24 1209 04/20/24 1727 05/18/24 1643       Core Components/Risk Factors/Patient Goals Review   Personal Goals Review Weight Management/Obesity;Lipids Weight Management/Obesity;Lipids Weight Management/Obesity;Lipids Weight Management/Obesity;Lipids    Review Charlene Tapia started cardiac rehab on 03/08/24. Charlene Tapia did fair with exercise for her fitness level. Charlene Tapia's vital were stable. Charlene Tapia is deconditoned. Charlene Tapia started cardiac rehab on 03/08/24. Charlene Tapia is off to a good start with exercise for her fitness level. Charlene Tapia's vital have been stable. Charlene Tapia is doing well  with exercise at cardiac rehab for her  for her fitness level. Charlene Tapia's vital have been stable. Charlene Tapia's weight is unchanged. Charlene Tapia is doing well with exercise at cardiac rehab for her fitness level. Vital signs have been stable. Charlene Tapia has lost 1.6kg since starting cardiac rehab.    Expected Outcomes Charlene Tapia will continue to participate in cardiac rehab for exercise, nutrition and lifestyle modifications. Charlene Tapia will continue to participate in cardiac rehab for exercise, nutrition and lifestyle modifications. Charlene Tapia will continue to participate in cardiac rehab for exercise, nutrition and lifestyle modifications. Charlene Tapia will continue to participate in cardiac rehab for exercise, nutrition and lifestyle modifications.       Core Components/Risk Factors/Patient Goals at Discharge (Final Review):   Goals and Risk Factor  Review - 05/18/24 1643       Core Components/Risk Factors/Patient Goals Review   Personal Goals Review Weight Management/Obesity;Lipids    Review Charlene Tapia is doing well with exercise at cardiac rehab for her fitness level. Vital signs have been stable. Charlene Tapia has lost 1.6kg since starting cardiac rehab.    Expected Outcomes Charlene Tapia will continue to participate in cardiac rehab for exercise, nutrition and lifestyle modifications.          ITP Comments:  ITP Comments     Row Name 03/02/24 1327 03/08/24 1650 03/23/24 1208 04/20/24 1726 05/18/24 1643   ITP Comments Wilbert Bihari, MD: Medical Director. Introduction to the Pritikin Education Program/Intensive Cardiac Rehab. Initial orientation packet reviewed with the patient. 30 Day ITP Review. Charlene Tapia started cardiac rehab on 03/08/24. Charlene Tapia did fair for her fitness level. Charlene Tapia says she is motivated to participate in the program. 30 Day ITP Review. Charlene Tapia started cardiac rehab on 03/08/24. Charlene Tapia is off to a good start to exercise for her fitness level. 30 Day ITP Review. Charlene Tapia has good attendance and participation with exercise at cardiac rehab 30 Day ITP Review. Charlene Tapia continues to have good attendance and participation with exercise at cardiac rehab      Comments: see ITP comments    [1]  Current Outpatient Medications:    aspirin  EC 81 MG tablet, Take 1 tablet (81 mg total) by mouth daily. Swallow whole., Disp: 120 tablet, Rfl: 3   fluticasone  (FLONASE ) 50 MCG/ACT nasal spray, Place 2 sprays into both nostrils daily as needed for allergies or rhinitis., Disp: , Rfl:    Ipratropium-Albuterol  (COMBIVENT) 20-100 MCG/ACT AERS respimat, Inhale 2 puffs into the lungs every 6 (six) hours as needed for wheezing or shortness of breath., Disp: 4 g, Rfl: 1   losartan  (COZAAR ) 25 MG tablet, Take 1 tablet (25 mg total) by mouth daily., Disp: 90 tablet, Rfl: 3   metoprolol  succinate (TOPROL  XL) 25 MG 24 hr tablet, Take 1 tablet (25 mg total) by mouth  at bedtime., Disp: 90 tablet, Rfl: 3   Multiple Vitamins-Minerals (MULTIVITAMIN GUMMIES WOMENS PO), Take 2 each by mouth daily  after supper., Disp: , Rfl:    rosuvastatin  (CRESTOR ) 5 MG tablet, Take 1 tablet (5 mg total) by mouth every other day., Disp: 90 tablet, Rfl: 3   spironolactone  (ALDACTONE ) 25 MG tablet, Take 1 tablet (25 mg total) by mouth daily., Disp: 90 tablet, Rfl: 3   torsemide  (DEMADEX ) 20 MG tablet, Take 1 tablet (20 mg total) by mouth daily., Disp: 90 tablet, Rfl: 3 [2]  Social History Tobacco Use  Smoking Status Never  Smokeless Tobacco Never

## 2024-05-19 ENCOUNTER — Encounter (HOSPITAL_COMMUNITY)
Admission: RE | Admit: 2024-05-19 | Discharge: 2024-05-19 | Disposition: A | Discharge status: Home / Self Care | Source: Ambulatory Visit | Attending: Internal Medicine | Admitting: Internal Medicine

## 2024-05-19 DIAGNOSIS — Z952 Presence of prosthetic heart valve: Secondary | ICD-10-CM | POA: Diagnosis not present

## 2024-05-21 ENCOUNTER — Encounter (HOSPITAL_COMMUNITY)
Admission: RE | Admit: 2024-05-21 | Discharge: 2024-05-21 | Disposition: A | Discharge status: Home / Self Care | Source: Ambulatory Visit | Attending: Internal Medicine | Admitting: Internal Medicine

## 2024-05-21 DIAGNOSIS — Z952 Presence of prosthetic heart valve: Secondary | ICD-10-CM | POA: Diagnosis not present

## 2024-05-21 DIAGNOSIS — Z48812 Encounter for surgical aftercare following surgery on the circulatory system: Secondary | ICD-10-CM | POA: Diagnosis present

## 2024-05-24 ENCOUNTER — Encounter (HOSPITAL_COMMUNITY)

## 2024-05-24 DIAGNOSIS — Z952 Presence of prosthetic heart valve: Secondary | ICD-10-CM

## 2024-05-24 DIAGNOSIS — Z48812 Encounter for surgical aftercare following surgery on the circulatory system: Secondary | ICD-10-CM | POA: Diagnosis not present

## 2024-05-25 ENCOUNTER — Ambulatory Visit
Admission: RE | Admit: 2024-05-25 | Discharge: 2024-05-25 | Disposition: A | Discharge status: Home / Self Care | Source: Ambulatory Visit | Attending: Nurse Practitioner | Admitting: Nurse Practitioner

## 2024-05-25 DIAGNOSIS — R2233 Localized swelling, mass and lump, upper limb, bilateral: Secondary | ICD-10-CM

## 2024-05-26 ENCOUNTER — Encounter (HOSPITAL_COMMUNITY)
Admission: RE | Admit: 2024-05-26 | Discharge: 2024-05-26 | Disposition: A | Discharge status: Home / Self Care | Source: Ambulatory Visit | Attending: Internal Medicine | Admitting: Internal Medicine

## 2024-05-26 DIAGNOSIS — Z952 Presence of prosthetic heart valve: Secondary | ICD-10-CM

## 2024-05-26 DIAGNOSIS — Z48812 Encounter for surgical aftercare following surgery on the circulatory system: Secondary | ICD-10-CM | POA: Diagnosis not present

## 2024-05-27 ENCOUNTER — Telehealth: Payer: Self-pay | Admitting: Internal Medicine

## 2024-05-27 MED ORDER — ROSUVASTATIN CALCIUM 5 MG PO TABS: 5.0000 mg | ORAL_TABLET | Freq: Every day | ORAL | 2 refills | Status: AC

## 2024-05-27 NOTE — Telephone Encounter (Signed)
" °*  STAT* If patient is at the pharmacy, call can be transferred to refill team.   1. Which medications need to be refilled? (please list name of each medication and dose if known)  rosuvastatin  (CRESTOR ) 5 MG tablet  2. Which pharmacy/location (including street and city if local pharmacy) is medication to be sent to? [id number] - [place], [place] - [address]  3. Do they need a 30 day or 90 day supply?  90 day supply  Patient says medication was increased from every other day to daily.  "

## 2024-05-27 NOTE — Telephone Encounter (Signed)
 Pt's medication was sent to pt's pharmacy as requested. Confirmation received.

## 2024-05-28 ENCOUNTER — Encounter (HOSPITAL_COMMUNITY)
Admission: RE | Admit: 2024-05-28 | Discharge: 2024-05-28 | Disposition: A | Source: Ambulatory Visit | Attending: Cardiovascular Disease | Admitting: Cardiovascular Disease

## 2024-05-28 DIAGNOSIS — Z48812 Encounter for surgical aftercare following surgery on the circulatory system: Secondary | ICD-10-CM | POA: Diagnosis not present

## 2024-05-28 DIAGNOSIS — Z952 Presence of prosthetic heart valve: Secondary | ICD-10-CM

## 2024-05-31 ENCOUNTER — Encounter (HOSPITAL_COMMUNITY)
Admission: RE | Admit: 2024-05-31 | Discharge: 2024-05-31 | Disposition: A | Source: Ambulatory Visit | Attending: Cardiovascular Disease | Admitting: Cardiovascular Disease

## 2024-05-31 DIAGNOSIS — Z952 Presence of prosthetic heart valve: Secondary | ICD-10-CM

## 2024-05-31 DIAGNOSIS — Z48812 Encounter for surgical aftercare following surgery on the circulatory system: Secondary | ICD-10-CM | POA: Diagnosis not present

## 2024-06-01 ENCOUNTER — Encounter: Payer: Self-pay | Admitting: Pharmacist

## 2024-06-02 ENCOUNTER — Encounter (HOSPITAL_COMMUNITY)
Admission: RE | Admit: 2024-06-02 | Discharge: 2024-06-02 | Disposition: A | Discharge status: Home / Self Care | Source: Ambulatory Visit | Attending: Cardiovascular Disease | Admitting: Cardiovascular Disease

## 2024-06-02 VITALS — Ht 61.75 in | Wt 262.1 lb

## 2024-06-02 DIAGNOSIS — Z48812 Encounter for surgical aftercare following surgery on the circulatory system: Secondary | ICD-10-CM | POA: Diagnosis not present

## 2024-06-02 DIAGNOSIS — Z952 Presence of prosthetic heart valve: Secondary | ICD-10-CM

## 2024-06-02 NOTE — Progress Notes (Signed)
 Discharge Progress Report  Patient Details  Name: Charlene Tapia MRN: [id number] Date of Birth: 13-Apr-1951 Referring Provider:   Flowsheet Row INTENSIVE CARDIAC REHAB ORIENT from 03/02/2024 in [facility]  Referring Provider Vina Gull, MD     Number of Visits: 19  Reason for Discharge:  Patient reached a stable level of exercise. Patient has met program and personal goals.  Smoking History:  Tobacco Use History[1]  Diagnosis:  02/04/23 S/P TAVR (transcatheter aortic valve replacement)  ADL [facility]:   Initial Exercise Prescription:  Initial Exercise Prescription - 03/02/24 1500       Date of Initial Exercise RX and Referring Provider   Date 03/02/24    Referring Provider Vina Gull, MD    Expected Discharge Date 05/26/24      NuStep   Level 1    SPM 75    Minutes 25    METs 1.2      Prescription Details   Frequency (times per week) 3    Duration Progress to 30 minutes of continuous aerobic without signs/symptoms of physical distress      Intensity   THRR 40-80% of Max Heartrate 60-119    Ratings of Perceived Exertion 11-13    Perceived Dyspnea 0-4      Progression   Progression Continue progressive overload as per policy without signs/symptoms or physical distress.      Resistance Training   Training Prescription Yes    Weight 2 lbs    Reps 10-15          Discharge Exercise Prescription (Final Exercise Prescription Changes):  Exercise Prescription Changes - 06/02/24 1642       Response to Exercise   Blood Pressure (Admit) 120/60    Blood Pressure (Exercise) 140/68    Blood Pressure (Exit) 106/72    Heart Rate (Admit) 94 bpm    Heart Rate (Exercise) 116 bpm    Heart Rate (Exit) 87 bpm    Rating of Perceived Exertion (Exercise) 13    Perceived Dyspnea (Exercise) 0    Symptoms None    Comments Pt graduated teh [facility] [facility] program.    Duration Progress to 30 minutes of  aerobic without  signs/symptoms of physical distress    Intensity THRR unchanged      Progression   Progression Continue to progress workloads to maintain intensity without signs/symptoms of physical distress.    Average METs 2.27      Resistance Training   Weight 2 lbs    Reps 10-15    Time 5 Minutes      Interval Training   Interval Training No      NuStep   Level 4    SPM 107    Minutes 15    METs 2.2      Track   Laps 7   post   Minutes 10    METs 2.33   97ft     Home Exercise Plan   Plans to continue exercise at Home (comment)    Frequency Add 2 additional days to program exercise sessions.    Initial Home Exercises Provided 02/18/24          Functional Capacity:  6 Minute Walk     Row Name 03/02/24 1540 06/02/24 1646       6 Minute Walk   Phase Initial Discharge    Distance 955 feet 920 feet    Distance % Change -- -3.66 %  Distance Feet Change -- -35 ft    Walk Time 6 minutes 6 minutes    # of Rest Breaks 0 0    MPH 1.8 1.74    METS 1.2 1.29    RPE 11 15    Perceived Dyspnea  1 0    VO2 Peak 3.6 4.51    Symptoms Yes (comment) No    Comments Mild SOB, RPD =1 resolved with rest --    Resting HR 74 bpm 94 bpm    Resting BP 106/64 120/60    Resting Oxygen Saturation  95 % --    Exercise Oxygen Saturation  during 6 min walk 95 % --    Max Ex. HR 85 bpm 116 bpm    Max Ex. BP 152/76 140/68    2 Minute Post BP 130/64 --       Psychological, QOL, Others - Outcomes: PHQ 2/9:    06/02/2024    3:32 PM 03/05/2024    8:22 AM 03/02/2024    3:48 PM 02/25/2023    4:14 PM 11/01/2022    8:37 AM  Depression screen PHQ 2/9  Decreased Interest 0 0 0 0 0  Down, Depressed, Hopeless 0 0 0 0 0  PHQ - 2 Score 0 0 0 0 0  Altered sleeping 0 2 0 0   Tired, decreased energy 0 0 0 0   Change in appetite 0 0 3 0   Feeling bad or failure about yourself  0 0 0 0   Trouble concentrating 0 0 0 0   Moving slowly or fidgety/restless 1 0 0 0   Suicidal thoughts 0 0 0 0   PHQ-9  Score 1 2  3   0    Difficult doing work/chores Somewhat difficult Not difficult at all Not difficult at all Not difficult at all      Data saved with a previous flowsheet row definition    Quality of Life:  Quality of Life - 06/02/24 1654       Quality of Life   Select Quality of Life      Quality of Life Scores   Health/Function Post 26.37 %    Socioeconomic Post 30 %    Psych/Spiritual Post 28.07 %    Family Post 28.5 %    GLOBAL Post 27.76 %          Personal Goals: Goals established at orientation with interventions provided to work toward goal.  Personal Goals and Risk Factors at Admission - 03/02/24 1546       Core Components/Risk Factors/Patient Goals on Admission    Weight Management Yes;Obesity;Weight Loss    Intervention Weight Management: Develop a combined nutrition and exercise program designed to reach desired caloric intake, while maintaining appropriate intake of nutrient and fiber, sodium and fats, and appropriate energy expenditure required for the weight goal.;Weight Management: Provide education and appropriate resources to help participant work on and attain dietary goals.;Weight Management/Obesity: Establish reasonable short term and long term weight goals.;Obesity: Provide education and appropriate resources to help participant work on and attain dietary goals.    Admit Weight 268 lb 15.4 oz (122 kg)    Expected Outcomes Short Term: Continue to assess and modify interventions until short term weight is achieved;Long Term: Adherence to nutrition and physical activity/exercise program aimed toward attainment of established weight goal;Weight Loss: Understanding of general recommendations for a balanced deficit meal plan, which promotes 1-2 lb weight loss per week and includes a negative energy balance  of [phone] kcal/d;Understanding recommendations for meals to include 15-35% energy as protein, 25-35% energy from fat, 35-60% energy from carbohydrates, less than  200mg  of dietary cholesterol, 20-35 gm of total fiber daily;Understanding of distribution of calorie intake throughout the day with the consumption of 4-5 meals/snacks    Lipids Yes    Intervention Provide education and support for participant on nutrition & aerobic/resistive exercise along with prescribed medications to achieve LDL 70mg , HDL >40mg .    Expected Outcomes Short Term: Participant states understanding of desired cholesterol values and is compliant with medications prescribed. Participant is following exercise prescription and nutrition guidelines.;Long Term: Cholesterol controlled with medications as prescribed, with individualized exercise RX and with personalized nutrition plan. Value goals: LDL < 70mg , HDL > 40 mg.           Personal Goals Discharge:  Goals and Risk Factor Review     Row Name 03/08/24 1655 03/23/24 1209 04/20/24 1727 05/18/24 1643       Core Components/Risk Factors/Patient Goals Review   Personal Goals Review Weight Management/Obesity;Lipids Weight Management/Obesity;Lipids Weight Management/Obesity;Lipids Weight Management/Obesity;Lipids    Review Debbie started cardiac rehab on 03/08/24. Debbie did fair with exercise for her fitness level. Debbie's vital were stable. Marval is deconditoned. Debbie started cardiac rehab on 03/08/24. Marval is off to a good start with exercise for her fitness level. Debbie's vital have been stable. Marval is doing well  with exercise at cardiac rehab for her  for her fitness level. Debbie's vital have been stable. Debbie's weight is unchanged. Marval is doing well with exercise at cardiac rehab for her fitness level. Vital signs have been stable. Marval has lost 1.6kg since starting cardiac rehab.    Expected Outcomes Marval will continue to participate in cardiac rehab for exercise, nutrition and lifestyle modifications. Marval will continue to participate in cardiac rehab for exercise, nutrition and lifestyle modifications.  Marval will continue to participate in cardiac rehab for exercise, nutrition and lifestyle modifications. Marval will continue to participate in cardiac rehab for exercise, nutrition and lifestyle modifications.       Exercise Goals and Review:  Exercise Goals     Row Name 03/02/24 1327             Exercise Goals   Increase Physical Activity Yes       Intervention Provide advice, education, support and counseling about physical activity/exercise needs.;Develop an individualized exercise prescription for aerobic and resistive training based on initial evaluation findings, risk stratification, comorbidities and participant's personal goals.       Expected Outcomes Short Term: Attend rehab on a regular basis to increase amount of physical activity.;Long Term: Add in home exercise to make exercise part of routine and to increase amount of physical activity.;Long Term: Exercising regularly at least 3-5 days a week.       Increase Strength and Stamina Yes       Intervention Provide advice, education, support and counseling about physical activity/exercise needs.;Develop an individualized exercise prescription for aerobic and resistive training based on initial evaluation findings, risk stratification, comorbidities and participant's personal goals.       Expected Outcomes Short Term: Increase workloads from initial exercise prescription for resistance, speed, and METs.;Short Term: Perform resistance training exercises routinely during rehab and add in resistance training at home;Long Term: Improve cardiorespiratory fitness, muscular endurance and strength as measured by increased METs and functional capacity ( )       Able to understand and use rate of perceived exertion (RPE) scale Yes  Intervention Provide education and explanation on how to use RPE scale       Expected Outcomes Short Term: Able to use RPE daily in rehab to express subjective intensity level;Long Term:  Able to use RPE to  guide intensity level when exercising independently       Knowledge and understanding of Target Heart Rate Range (THRR) Yes       Intervention Provide education and explanation of THRR including how the numbers were predicted and where they are located for reference       Expected Outcomes Short Term: Able to state/look up THRR;Short Term: Able to use daily as guideline for intensity in rehab;Long Term: Able to use THRR to govern intensity when exercising independently       Understanding of Exercise Prescription Yes       Intervention Provide education, explanation, and written materials on patient's individual exercise prescription       Expected Outcomes Short Term: Able to explain program exercise prescription;Long Term: Able to explain home exercise prescription to exercise independently          Exercise Goals Re-Evaluation:  Exercise Goals Re-Evaluation     Row Name 03/08/24 1557 04/02/24 1630 05/05/24 1736 06/03/24 0941       Exercise Goal Re-Evaluation   Exercise Goals Review Increase Physical Activity;Increase Strength and Stamina;Able to understand and use rate of perceived exertion (RPE) scale Increase Physical Activity;Increase Strength and Stamina;Able to understand and use rate of perceived exertion (RPE) scale;Understanding of Exercise Prescription;Knowledge and understanding of Target Heart Rate Range (THRR) Increase Physical Activity;Increase Strength and Stamina;Able to understand and use rate of perceived exertion (RPE) scale;Understanding of Exercise Prescription;Knowledge and understanding of Target Heart Rate Range (THRR) Increase Physical Activity;Increase Strength and Stamina;Able to understand and use rate of perceived exertion (RPE) scale;Understanding of Exercise Prescription;Knowledge and understanding of Target Heart Rate Range (THRR)    Comments Marval is able to understand and use RPE scale appropriately. Reviewed MET's and goals. Pt tolerated exercise well with an  average MET level of 1.7. She is feeling good with exercise and is INC WL's, but not MET's. Talked today about increasing the depth of step for greater increases in MET's. She is now able to achieve 30 mins and is feeling better about her balance. She does have a goal to walk more and improve overall strength and flex. We may add in Octane or maybe track with a rollator, but want to focus first on increasing endurance and MET's on the nustep Reviewed MET's and goals. Pt tolerated exercise well with an average MET level of 1.7. She is feeling good with exercise. She says her ADLs are getting easier. She can walk more without stopping and has been able to decorate for christmas. She says she notices a huge change since starting the program Pt gradauted the [facility] program. Pt tolerated exercise well with an average MET level of 2.3. Even though she did not increase her feet on her post walk test, she feels better and more safe with exercise and doing things around the house. She also increased her PYP score and increased her QOL scores. Overall she feels better and has decreased her %BF as well. She will continue to exercise on her own by doing chair fitness classes on youtube, seated bike, treadmill and some walking. She may join [facility] or her local [employer] She will exercise 5 days for 30-45 mins    Expected Outcomes Progress workloads as tolerated to help increase strength,  stamina, and flexibility. Progress workloads as tolerated to help increase strength, stamina, and flexibility. Progress workloads as tolerated to help increase strength, stamina, and flexibility. Progress workloads as tolerated to help increase strength, stamina, and flexibility.       Nutrition & Weight - Outcomes:  Pre Biometrics - 03/02/24 1400       Pre Biometrics   Waist Circumference 56.5 inches    Hip Circumference 54.5 inches    Waist to Hip Ratio 1.04 %    Triceps Skinfold 42 mm    % Body Fat 62 %    Grip Strength 19  kg    Flexibility 9 in    Single Leg Stand --   No perfromed, pt refused: afraid of falling         Post Biometrics - 06/02/24 1654        Post  Biometrics   Height 5' 1.75 (1.568 m)    Weight 118.9 kg    Waist Circumference 52.5 inches    Hip Circumference 53 inches    Waist to Hip Ratio 0.99 %    BMI (Calculated) 48.36    Triceps Skinfold 42 mm    % Body Fat 59.7 %    Grip Strength 27 kg    Flexibility 7 in          Nutrition:   Nutrition Discharge:   Education Questionnaire Score:  Knowledge Questionnaire Score - 06/03/24 0940       Knowledge Questionnaire Score   Post Score 24/24          Goals reviewed with patient; copy given to patient. Debbie  graduated from  Intensive/Traditional cardiac rehab program on 06/02/24 with completion of  71 exercise and education sessions. Pt maintained good attendance and progressed nicely during her participation in rehab as evidenced by increased MET level. Debbie lost 3.1 kg while enrolled in the program. Medication list reconciled. Repeat  PHQ score- 1 . Marval has made significant lifestyle changes and should be commended for her success. Debbie achieved  her goals during cardiac rehab.   Pt plans to continue exercise at the [facility] 2-3 days a week. Marval has a treadmill recumbent bike and a bioflex along with 3 pound weights which she says she will use at home along with doing chair exercises.  Marval says that she can do a lot more at home and feels a lot better. Marval says that she was able to put up [employer] during the holidays. We are proud of Debbie's progress and weight loss. Hadassah Elpidio Quan RN BSN       [1]  Social History Tobacco Use  Smoking Status Never  Smokeless Tobacco Never

## 2024-06-03 ENCOUNTER — Other Ambulatory Visit: Payer: Self-pay

## 2024-06-03 ENCOUNTER — Ambulatory Visit
Admission: RE | Admit: 2024-06-03 | Discharge: 2024-06-03 | Disposition: A | Discharge status: Home / Self Care | Attending: Nurse Practitioner | Admitting: Nurse Practitioner

## 2024-06-03 VITALS — BP 104/56 | HR 81 | Temp 98.4°F | Resp 20

## 2024-06-03 DIAGNOSIS — L02219 Cutaneous abscess of trunk, unspecified: Secondary | ICD-10-CM | POA: Diagnosis not present

## 2024-06-03 DIAGNOSIS — L03319 Cellulitis of trunk, unspecified: Secondary | ICD-10-CM | POA: Diagnosis not present

## 2024-06-03 MED ORDER — CEPHALEXIN 500 MG PO CAPS: 500.0000 mg | ORAL_CAPSULE | Freq: Four times a day (QID) | ORAL | 0 refills | Status: AC

## 2024-06-03 NOTE — Discharge Instructions (Addendum)
 We drained the abscess on your back today.  Please keep the area clean and dry.  Clean twice daily with soap and water and cover with gauze until the area stops draining.  Take the oral antibiotic as prescribed to treat for skin infection around the abscess.  Seek care if symptoms worsen or recur despite treatment.

## 2024-06-03 NOTE — ED Triage Notes (Signed)
 Pt reports abscess on back since monday. Pt reports hx of similar and reports this one is starting to open up.

## 2024-06-03 NOTE — ED Provider Notes (Signed)
 " [facility]    CSN: [id number] Arrival date & time: 06/03/24  1018      History   Chief Complaint Chief Complaint  Patient presents with   Abscess    on left upper back came up on Monday Morning and getting worse - Entered by patient    HPI Charlene Tapia is a 74 y.o. female.   Patient presents today for abscess to left trunk.  No known trauma to the area; wonders if it may be a bug bite.  Reports it has been there for a few days and is getting bigger, more sore, and more painful.  No drainage but thinks it is getting ready to drain.  No fever or nausea/vomiting.  Reports history of similar but typically self resolve.     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 Wendel and Dr. 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   Severe aortic stenosis    Psoriasis 10/18/2022   Hyperlipidemia 10/18/2022   Prediabetes 10/18/2022   Thrombocytopenia 10/18/2022   HTN (hypertension) 10/18/2022   Hand arthritis 12/03/2021   Chronic hand pain 11/12/2021   Umbilical hernia without obstruction and without gangrene 11/12/2021   Arthritis of right knee 09/29/2013   Arthritis of left knee 08/16/2013    Arthritis of knee, left 08/15/2013   Degenerative arthritis of left knee 05/04/2013   Obesity, morbid, BMI 50 or higher (HCC) 05/04/2013   Left knee pain 11/10/2012   Trigger middle finger of right hand 10/31/2010   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   TROCHANTERIC BURSITIS, RIGHT 12/09/2008    Past Surgical History:  Procedure Laterality Date   CARDIAC CATHETERIZATION     GANGLION CYST EXCISION Right 01/18/1989   INGUINAL HERNIA REPAIR Bilateral 1952   age 10 month   INTRAOPERATIVE TRANSTHORACIC ECHOCARDIOGRAM N/A 02/04/2023   Procedure: INTRAOPERATIVE TRANSTHORACIC ECHOCARDIOGRAM;  Surgeon: Wendel Lurena POUR, MD;  Location: [facility];  Service: Open Heart Surgery;  Laterality: N/A;   JOINT REPLACEMENT     PACEMAKER IMPLANT N/A 02/18/2023   Procedure: PACEMAKER IMPLANT;  Surgeon: Kennyth Chew, MD;  Location: [facility];  Service: Cardiovascular;  Laterality: N/A;   RIGHT HEART CATH AND CORONARY ANGIOGRAPHY N/A 11/25/2022   Procedure: RIGHT HEART CATH AND CORONARY ANGIOGRAPHY;  Surgeon: Wendel Lurena POUR, MD;  Location: [facility];  Service: Cardiovascular;  Laterality: N/A;   TEMPORARY PACEMAKER N/A 02/17/2023   Procedure: TEMPORARY PACEMAKER;  Surgeon: Mady Bruckner, MD;  Location: [facility];  Service: Cardiovascular;  Laterality: N/A;   TOTAL KNEE ARTHROPLASTY Left 08/16/2013   Procedure: TOTAL KNEE ARTHROPLASTY;  Surgeon: Dempsey JINNY Sensor,  MD;  Location: [facility];  Service: Orthopedics;  Laterality: Left;   TOTAL KNEE ARTHROPLASTY Right 09/29/2013   TOTAL KNEE ARTHROPLASTY Right 09/29/2013   Procedure: TOTAL KNEE ARTHROPLASTY;  Surgeon: Dempsey JINNY Sensor, MD;  Location: [facility];  Service: Orthopedics;  Laterality: Right;   TRANSCATHETER AORTIC VALVE REPLACEMENT, TRANSFEMORAL N/A 02/04/2023   Procedure: Transcatheter Aortic Valve Replacement, Transfemoral;  Surgeon: Thukkani, Arun K, MD;  Location: [facility];   Service: Open Heart Surgery;  Laterality: N/A;    OB History   No obstetric history on file.      Home Medications    Prior to Admission medications  Medication Sig Start Date End Date Taking? Authorizing Provider  cephALEXin  (KEFLEX ) 500 MG capsule Take 1 capsule (500 mg total) by mouth 4 (four) times daily for 5 days. 06/03/24 06/08/24 Yes Chandra Harlene LABOR, NP  aspirin  EC 81 MG tablet Take 1 tablet (81 mg total) by mouth daily. Swallow whole. 02/05/23   Sebastian Lamarr SAUNDERS, PA-C  fluticasone  (FLONASE ) 50 MCG/ACT nasal spray Place 2 sprays into both nostrils daily as needed for allergies or rhinitis.    [provider]  Ipratropium-Albuterol  (COMBIVENT) 20-100 MCG/ACT AERS respimat Inhale 2 puffs into the lungs every 6 (six) hours as needed for wheezing or shortness of breath. 07/06/23   Rosario Leatrice FERNS, MD  losartan  (COZAAR ) 25 MG tablet Take 1 tablet (25 mg total) by mouth daily. 07/17/23   Thukkani, Arun K, MD  metoprolol  succinate (TOPROL  XL) 25 MG 24 hr tablet Take 1 tablet (25 mg total) by mouth at bedtime. 03/11/24   Thukkani, Arun K, MD  Multiple Vitamins-Minerals (MULTIVITAMIN GUMMIES WOMENS PO) Take 2 each by mouth daily after supper.    [provider]  rosuvastatin  (CRESTOR ) 5 MG tablet Take 1 tablet (5 mg total) by mouth daily. 05/27/24   Thukkani, Arun K, MD  spironolactone  (ALDACTONE ) 25 MG tablet Take 1 tablet (25 mg total) by mouth daily. 07/17/23   Thukkani, Arun K, MD  torsemide  (DEMADEX ) 20 MG tablet Take 1 tablet (20 mg total) by mouth daily. 07/30/23   Wendel Lurena POUR, MD    Family History Family History  Problem Relation Age of Onset   Heart disease Father    Hypertension Father    Alcohol abuse Father     Social History Social History[1]   Allergies   Phisohex [hexachlorophene ]   Review of Systems Review of Systems Per HPI  Physical Exam Triage Vital Signs ED Triage Vitals [06/03/24 1027]  Encounter Vitals Group     BP (!)  104/56     Girls Systolic BP Percentile      Girls Diastolic BP Percentile      Boys Systolic BP Percentile      Boys Diastolic BP Percentile      Pulse Rate 81     Resp 20     Temp 98.4 F (36.9 C)     Temp Source Oral     SpO2 94 %     Weight      Height      Head Circumference      Peak Flow      Pain Score 7     Pain Loc      Pain Education      Exclude from Growth Chart    No data found.  Updated Vital Signs BP (!) 104/56 (BP Location: Right Arm)   Pulse 81   Temp 98.4 F (36.9 C) (Oral)  Resp 20   SpO2 94%   Visual Acuity Right Eye Distance:   Left Eye Distance:   Bilateral Distance:    Right Eye Near:   Left Eye Near:    Bilateral Near:     Physical Exam Vitals and nursing note reviewed.  Constitutional:      General: She is not in acute distress.    Appearance: Normal appearance. She is not toxic-appearing.  HENT:     Mouth/Throat:     Mouth: Mucous membranes are moist.     Pharynx: Oropharynx is clear.  Pulmonary:     Effort: Pulmonary effort is normal. No respiratory distress.  Skin:    General: Skin is warm and dry.     Capillary Refill: Capillary refill takes less than 2 seconds.     Findings: Abscess present.         Comments: Abscess to left trunk in approximately area marked; the abscess is 1.5 cm x 1.5 cm in size and there is surrounding erythema.  Small amount of purulent drainage with palpation.  Neurological:     Mental Status: She is alert and oriented to person, place, and time.  Psychiatric:        Behavior: Behavior is cooperative.      UC Treatments / Results  Labs (all labs ordered are listed, but only abnormal results are displayed) Labs Reviewed - No data to display  EKG   Radiology No results found.  Procedures Incision and Drainage  Date/Time: 06/04/2024 12:41 PM  Performed by: Chandra Harlene LABOR, NP Authorized by: Chandra Harlene LABOR, NP   Consent:    Consent obtained:  Verbal   Consent given by:   Patient   Risks, benefits, and alternatives were discussed: yes     Risks discussed:  Bleeding, incomplete drainage, pain and infection   Alternatives discussed:  Alternative treatment Universal protocol:    Procedure explained and questions answered to patient or proxy's satisfaction: yes     Patient identity confirmed:  Verbally with patient Location:    Type:  Abscess   Size:  1.5 cm x 1.5 cm   Location:  Trunk   Trunk location:  Back Pre-procedure details:    Skin preparation:  Antiseptic wash and povidone-iodine Sedation:    Sedation type:  None Anesthesia:    Anesthesia method:  Local infiltration   Local anesthetic:  Lidocaine  2% WITH epi Procedure type:    Complexity:  Simple Procedure details:    Ultrasound guidance: no     Needle aspiration: no     Incision types:  Stab incision   Incision depth:  Dermal   Wound management:  Probed and deloculated   Drainage:  Purulent and bloody   Drainage amount:  Copious   Wound treatment:  Wound left open   Packing materials:  None Post-procedure details:    Procedure completion:  Tolerated well, no immediate complications  (including critical care time)  Medications Ordered in UC Medications - No data to display  Initial Impression / Assessment and Plan / UC Course  I have reviewed the triage vital signs and the nursing notes.  Pertinent labs & imaging results that were available during my care of the patient were reviewed by me and considered in my medical decision making (see chart for details).   Patient is a pleasant, well-appearing 74 year old female presenting today for abscess on left trunk.  Vital signs are stable in triage.  On exam, she has a fluctuant abscess to left trunk.  I&D was performed as above.  Patient tolerated well.  Given surrounding cellulitis, treat with Keflex  4 times a day for 5 days.  Wound care discussed.  ER and return precautions discussed.  The patient was given the opportunity to ask  questions.  All questions answered to their satisfaction.  The patient is in agreement to this plan.   Final Clinical Impressions(s) / UC Diagnoses   Final diagnoses:  Cellulitis and abscess of trunk     Discharge Instructions      We drained the abscess on your back today.  Please keep the area clean and dry.  Clean twice daily with soap and water and cover with gauze until the area stops draining.  Take the oral antibiotic as prescribed to treat for skin infection around the abscess.  Seek care if symptoms worsen or recur despite treatment.    ED Prescriptions     Medication Sig Dispense Auth. Provider   cephALEXin  (KEFLEX ) 500 MG capsule Take 1 capsule (500 mg total) by mouth 4 (four) times daily for 5 days. 20 capsule Chandra Harlene LABOR, NP      PDMP not reviewed this encounter.     [1]  Social History Tobacco Use   Smoking status: Never   Smokeless tobacco: Never  Vaping Use   Vaping status: Never Used  Substance Use Topics   Alcohol use: Yes    Comment: Social drinking wine only   Drug use: No     Chandra Harlene LABOR, NP 06/04/24 1243  "

## 2024-06-04 ENCOUNTER — Ambulatory Visit: Payer: Self-pay | Admitting: Nurse Practitioner

## 2024-06-04 DIAGNOSIS — L03319 Cellulitis of trunk, unspecified: Secondary | ICD-10-CM

## 2024-06-04 DIAGNOSIS — L02219 Cutaneous abscess of trunk, unspecified: Secondary | ICD-10-CM | POA: Diagnosis not present

## 2024-10-29 ENCOUNTER — Ambulatory Visit: Admitting: Nurse Practitioner

## 2024-12-27 ENCOUNTER — Ambulatory Visit: Admitting: Physician Assistant

## 2025-03-08 ENCOUNTER — Ambulatory Visit
